# Patient Record
Sex: Female | Born: 1940 | Race: White | Hispanic: No | Marital: Married | Smoking: Never smoker
Health system: Southern US, Community
[De-identification: ages and names within clinical notes are randomized; demographics above are authoritative.]

## PROBLEM LIST (undated history)

## (undated) DIAGNOSIS — F419 Anxiety disorder, unspecified: Secondary | ICD-10-CM

## (undated) DIAGNOSIS — R0602 Shortness of breath: Secondary | ICD-10-CM

## (undated) DIAGNOSIS — G8929 Other chronic pain: Secondary | ICD-10-CM

## (undated) DIAGNOSIS — M797 Fibromyalgia: Secondary | ICD-10-CM

## (undated) DIAGNOSIS — I10 Essential (primary) hypertension: Secondary | ICD-10-CM

## (undated) DIAGNOSIS — R1310 Dysphagia, unspecified: Secondary | ICD-10-CM

## (undated) DIAGNOSIS — J45909 Unspecified asthma, uncomplicated: Secondary | ICD-10-CM

## (undated) DIAGNOSIS — M1990 Unspecified osteoarthritis, unspecified site: Secondary | ICD-10-CM

## (undated) DIAGNOSIS — Z9889 Other specified postprocedural states: Secondary | ICD-10-CM

## (undated) DIAGNOSIS — E7800 Pure hypercholesterolemia, unspecified: Secondary | ICD-10-CM

## (undated) DIAGNOSIS — R112 Nausea with vomiting, unspecified: Secondary | ICD-10-CM

## (undated) DIAGNOSIS — K219 Gastro-esophageal reflux disease without esophagitis: Secondary | ICD-10-CM

## (undated) DIAGNOSIS — E039 Hypothyroidism, unspecified: Secondary | ICD-10-CM

## (undated) DIAGNOSIS — M545 Low back pain, unspecified: Secondary | ICD-10-CM

## (undated) DIAGNOSIS — I2510 Atherosclerotic heart disease of native coronary artery without angina pectoris: Secondary | ICD-10-CM

## (undated) DIAGNOSIS — I499 Cardiac arrhythmia, unspecified: Secondary | ICD-10-CM

## (undated) DIAGNOSIS — IMO0001 Reserved for inherently not codable concepts without codable children: Secondary | ICD-10-CM

## (undated) DIAGNOSIS — C44311 Basal cell carcinoma of skin of nose: Secondary | ICD-10-CM

## (undated) HISTORY — PX: APPENDECTOMY: SHX54

## (undated) HISTORY — PX: BASAL CELL CARCINOMA EXCISION: SHX1214

## (undated) HISTORY — PX: BACK SURGERY: SHX140

## (undated) HISTORY — PX: CORONARY ANGIOPLASTY WITH STENT PLACEMENT: SHX49

## (undated) HISTORY — PX: CARDIAC CATHETERIZATION: SHX172

## (undated) HISTORY — PX: OOPHORECTOMY: SHX86

## (undated) HISTORY — PX: DILATION AND CURETTAGE OF UTERUS: SHX78

## (undated) HISTORY — PX: TUBAL LIGATION: SHX77

## (undated) HISTORY — PX: TONSILLECTOMY: SUR1361

## (undated) HISTORY — PX: POSTERIOR FUSION LUMBAR SPINE: SUR632

## (undated) HISTORY — DX: Fibromyalgia: M79.7

## (undated) HISTORY — PX: CATARACT EXTRACTION W/ INTRAOCULAR LENS  IMPLANT, BILATERAL: SHX1307

## (undated) HISTORY — PX: TOTAL THYROIDECTOMY: SHX2547

---

## 2008-05-31 HISTORY — PX: ANTERIOR CERVICAL DECOMP/DISCECTOMY FUSION: SHX1161

## 2008-08-12 ENCOUNTER — Ambulatory Visit: Payer: Self-pay | Admitting: Vascular Surgery

## 2008-11-18 ENCOUNTER — Encounter: Admission: RE | Admit: 2008-11-18 | Discharge: 2008-11-18 | Payer: Self-pay | Admitting: Neurosurgery

## 2009-01-28 ENCOUNTER — Inpatient Hospital Stay (HOSPITAL_COMMUNITY): Admission: RE | Admit: 2009-01-28 | Discharge: 2009-01-30 | Payer: Self-pay | Admitting: Neurosurgery

## 2010-01-27 ENCOUNTER — Encounter: Admission: RE | Admit: 2010-01-27 | Discharge: 2010-01-27 | Payer: Self-pay | Admitting: Neurosurgery

## 2010-09-05 LAB — COMPREHENSIVE METABOLIC PANEL
AST: 22 U/L (ref 0–37)
Albumin: 4 g/dL (ref 3.5–5.2)
Alkaline Phosphatase: 48 U/L (ref 39–117)
Chloride: 105 mEq/L (ref 96–112)
GFR calc Af Amer: >60 mL/min (ref >60)

## 2010-09-05 LAB — CBC
MCHC: 33.5 g/dL (ref 30.0–36.0)
RBC: 4.6 MIL/uL (ref 3.87–5.11)
RDW: 14.1 % (ref 11.5–15.5)
WBC: 7.8 10*3/uL (ref 4.0–10.5)

## 2010-09-05 LAB — PROTIME-INR: Prothrombin Time: 12.9 seconds (ref 11.6–15.2)

## 2010-10-13 NOTE — Consult Note (Signed)
VASCULAR SURGERY CONSULTATION   KOSISOCHUKWU, GOLDBERG  DOB:  [birth year]                                       08/12/2008  [id number]#:[id number]   This is a New Patient Consultation.   The patient is a 70 year old female who has been having increasing  discomfort, particularly in the right posterior thigh and calf with  standing.  She describes this as an aching burning, throbbing discomfort  over some prominent varicose veins in this area.  She has no swelling or  history of bleeding ulceration, thrombophlebitis or deep venous  thrombosis but has increasing discomfort, as the day progresses, which  is affecting her daily living.  She does not wear elastic compression  stockings.  She does take Aleve and tramadol on a regular basis for her  neck and back as pain medication.  Elevation does not relieve her  symptoms completely.  She has no symptoms on the contralateral left leg.   PAST MEDICAL HISTORY:  1. Cardiac arrhythmias.  2. Negative for:      a.     Diabetes.      b.     Hypertension.      c.     Coronary artery disease.      d.     COPD.      e.     Stroke.   PAST SURGICAL HISTORY:  1. Appendectomy.  2. Tonsillectomy.  3. Thyroidectomy.   FAMILY HISTORY:  Positive for cardiac arrhythmia in her brother,  coronary artery disease and stroke in her father, negative for diabetes.   SOCIAL HISTORY:  She is married, has 2 children and is retired.  Does  not use tobacco or alcohol.   REVIEW OF SYSTEMS:  Pretty much unremarkable with the exception of the  cardiac arrhythmias.  She has been treated with a variety of medications  over the years.  Denies any chest pain, dyspnea on exertion, PND,  orthopnea or claudication symptoms.   ALLERGIES:  Macrodantin and penicillin.   MEDICATIONS:  Please see health history form.   PHYSICAL EXAM:  Blood pressure 113/73, heart rate 69, respirations 14.  General:  She is a female who is in no apparent stress, alert and  oriented x3.  Neck:  Supple, 3+ carotid pulses palpable.  No bruits are  audible.  Neurologic:  Normal.  No palpable adenopathy in the neck.  Chest:  Clear to auscultation.  Cardiovascular:  Reveals a regular  rhythm, no murmurs.  Abdomen:  Soft, nontender with no masses.  She has  3+ femoral, popliteal and posterior tibial pulses bilaterally.  Both  legs have venous insufficiency, right worse than left.  She has diffuse  spider veins in the anterior thighs on the right medially and laterally  and also near the medial ankle area.  Posterior popliteal fossa area has  some large reticular veins and small varicosities surrounding this with  spider veins in the distal thigh area.  There is no hyperpigmentation or  ulceration distally.  The left leg has diffuse spider veins in the  anterior thighs medially and laterally as well as some prominent  varicose veins in the posterior calf.  No distal edema is noted on the  left.   Venous duplex exam in our office today revealed no reflux in the small  saphenous veins bilaterally and no reflux in the  great saphenous veins  on the left.  The right leg has a prominent parallel accessory branch  medially which communicates at the saphenofemoral junction which does  have reflex down to the distal thigh, but the great saphenous vein main  trunk laterally does not have reflux.  Deep systems are unremarkable.   She does have reflux on the right which is the symptomatic leg in this  medial branch.  I think that it could be contributing to the painful  varicosities in the distal thigh and calf.  I visualized this with the  SonoSite and believe that it is too small to treat with laser ablation  but I do think she would be a good candidate for sessions of  sclerotherapy for these painful varicosities which are supplied by this  refluxing vein.  We will treat her conservatively for 3 months with long-  leg elastic compression stockings (20-mm to 30-mm gradient)  as well as  elevation and analgesics.  If she has had no improvement then I think  sessions of sclerotherapy on the right should be performed for these  painful varicosities and she may elect to have sclerotherapy performed  on the left leg for the venous disease as well.   Quita Skye Hart [place], M.D.  Electronically Signed  JDL/MEDQ  D:  08/12/2008  T:  08/13/2008  Job:  [id number]   cc:   Dr. Janee Morn

## 2010-10-13 NOTE — Procedures (Signed)
LOWER EXTREMITY VENOUS REFLUX EXAM   INDICATION:  Bilateral leg varicose veins with pain and swelling.   EXAM:  Using color-flow imaging and pulse Doppler spectral analysis, the  right and left common femoral, superficial femoral, popliteal, posterior  tibial, greater and lesser saphenous veins are evaluated.  There is no  evidence suggesting deep venous insufficiency in the right and left  lower extremity.   The right and left saphenofemoral junction is competent.  The right and  left GSV is competent with the caliber as described below.   The right and left proximal short saphenous vein demonstrates  competency.   GSV Diameter (used if found to be incompetent only)                                            Right    Left  Proximal Greater Saphenous Vein           cm       cm  Proximal-to-mid-thigh                     cm       cm  Mid thigh                                 cm       cm  Mid-distal thigh                          cm       cm  Distal thigh                              cm       cm  Knee                                      cm       cm   IMPRESSION:  1. No evidence of reflux noted in the right and left greater saphenous      veins.  2. The right greater saphenous lateral branch appears to have reflux      with the measurement from 0.43 to 0.50 cm.  3. The right and left greater saphenous veins are not tortuous.  4. The right and left greater saphenous veins are not aneurysmal.  5. The deep system is competent.  6. The right and left lesser saphenous veins are competent.  7. No evidence of deep venous thrombosis noted in the bilateral legs.   ___________________________________________  Quita Skye Hart [place], M.D.   MG/MEDQ  D:  08/12/2008  T:  08/12/2008  Job:  [id number]

## 2010-10-13 NOTE — Op Note (Signed)
Kim Mcgee, Kim Mcgee               ACCOUNT NO.:  [id number]   MEDICAL RECORD NO.:  [id number]          PATIENT TYPE:  INP   LOCATION:  3017                         FACILITY:  [facility]   PHYSICIAN:  Clydene Fake, M.D.  DATE OF BIRTH:  Feb 28, 1941   DATE OF PROCEDURE:  01/28/2009  DATE OF DISCHARGE:                               OPERATIVE REPORT   PREOPERATIVE DIAGNOSES:  Cervical spondylosis, stenosis, myelopathy, and  radiculopathy at C4-5, 5-6, and 6-7.   POSTOPERATIVE DIAGNOSES:  Cervical spondylosis, stenosis, myelopathy,  and radiculopathy at C4-5, 5-6, and 6-7.   PROCEDURES:  Anterior cervical decompression, diskectomy, and fusion at  C4-5, 5-6, and 6-7 with LifeNet allograft bone, Trestle anterior  cervical plate.   SURGEON:  Clydene Fake, MD   ASSISTANT:  Coletta Memos, MD   ANESTHESIA:  General endotracheal tube anesthesia.   ESTIMATED BLOOD LOSS:  Minimal.   BLOOD GIVEN:  None.   DRAINS:  None.   COMPLICATIONS:  None.   REASON FOR PROCEDURE:  The patient is a 70 year old woman, who has been  having neck and the right shoulder arm pain with some progressive  clumsiness, who has had a couple of MRIs over a period of time showing  worsening spondylytic change of the cervical spine with cervical  stenosis and some cord compression at 5-6 and severe spondylytic  changes, the right femoral narrowing in 4-5 and left femoral narrowing  in 6-7.  The patient was brought in for decompression and fusion at  these levels.   PROCEDURE IN DETAIL:  The patient was brought to the operating room and  general anesthesia was induced.  The patient was placed in 10-pound  Halter traction, prepped and draped in standard sterile fashion.  Site  of incision was injected with 10 mL of 1% lidocaine with epinephrine.  Incision was then made from the midline to the anterior border of the  sternocleidomastoid muscle on the left side.  The neck incision was  taken down to the platysma and  hemostasis was obtained with Bovie  cauterization.  The platysma muscle was incised with a Bovie and blunt  dissection taken through the anterior cervical fascia to the anterior  cervical spine.  Needle was placed in interspace.  X-rays were obtained  showing this was at the 4-5 space.  Disk space was incised with a 15  blade and partial diskectomy was performed as ligament was removed and  exposed the 5-6 and 6-7 disk spaces also, and the longus colli muscle  was reflected laterally at C4-C7 bilaterally using the Bovie, and self-  retaining retractors were placed.  We could see the 4-5 and 5-6 disk  spaces.  Disk spaces were again incised with 15 blade.  Diskectomy  continued with pituitary rongeurs.  Anterior osteophytes were removed  with Leksell rongeurs and Kerrison punches.  Distraction pins were  placed in the C4 and C6 interspaces and distracted.  Microscope was  brought in for microdissection.  At this point starting at the 5-6 level  __________ diskectomy continued to__________ spondylitic.  A high speed  drill was used to  help drill __________ disk space.  We then were able  to use curettes and then 1- and 2-mm Kerrison punches to remove disk,  posterior ligament, and posterior osteophytes.  As we decompressed the  central canal and then did bilateral foraminotomies to make sure the  foramen were opened.  We measured height of disk space to be 4 mm.  We  got hemostasis with Gelfoam and thrombin.  At this point, attention went  to 4-5 for a diskectomy, continued with pituitary rongeurs and curettes  and 1- and 2-mm Kerrison punches to remove the posterior disk, ligament,  and osteophytes decompressing the central canal and we performed  bilateral foraminotomies with the Kittner.  We used a high-speed drill  to remove cartilaginous endplates.  We then measured height of disk  space to be 5 mm and we got hemostasis with Gelfoam and thrombin.  We  then tapped the corresponding bone,  selected an allograft which was  placed in position,  a 4 mm at 5-6 and a 5 mm at 4-5, countersinking of  couple of millimeters and checking posterior to the graft, making sure  there was plenty of room between bone graft and dura and all that looked  good.  We irrigated with antibiotic solution, removed the distraction  pin from C4, removed a retractor down from C6-7 level, inside of the  disk space with partial diskectomy and pituitary rongeursto remove  anterior osteophytes, placed distraction pins at C7, and distracted the  interspace at the 6-7.  Diskectomy continued with curettes and then 1-  and 2-mm Kerrison punches to remove posterior ligaments and disk  osteophytes and did bilateral foraminotomies to make sure the nerve  roots were decompressed.  I used a high-speed drill to remove  cartilaginous endplate, measured the height of disk space to be 5 mm,  and then tapped a 5-mm LifeNet allograft bone into place.  Distraction  pins removed.  Hemostasis was obtained with Gelfoam and thrombin.  Weight was removed from the traction.  Bone plugs were in good position  and firmly in place.  A Trestle anterior cervical plate was placed over  the anterior cervical spine and 2 screws were placed in C4, 2 in the C5,  2 in the C6, and 2 in the C7, these were tightened down.  Lateral x-rays  obtained showing good position of plates, screws, bone plugs, __________  4-5, 5-6, and 6-7.  We irrigated with antibiotic solution with very good  hemostasis, and the platysma was closed with 3-0 Vicryl interrupted  sutures.  Subcutaneous tissue was also closed with 3-0 Vicryl  interrupted sutures.  The skin was closed with benzoin and Steri-Strips.  Dressing was placed.  The patient was placed in a soft cervical collar,  awoken from anesthesia, and transferred to recovery room in stable  condition.           ______________________________  Clydene Fake, M.D.     JRH/MEDQ  D:  01/28/2009  T:   01/29/2009  Job:  [id number]

## 2011-10-07 ENCOUNTER — Other Ambulatory Visit: Payer: Self-pay | Admitting: Neurosurgery

## 2011-10-07 DIAGNOSIS — M546 Pain in thoracic spine: Secondary | ICD-10-CM

## 2011-10-07 DIAGNOSIS — IMO0002 Reserved for concepts with insufficient information to code with codable children: Secondary | ICD-10-CM

## 2011-10-07 DIAGNOSIS — M545 Low back pain: Secondary | ICD-10-CM

## 2011-10-20 ENCOUNTER — Other Ambulatory Visit: Payer: Self-pay

## 2011-12-01 ENCOUNTER — Ambulatory Visit
Admission: RE | Admit: 2011-12-01 | Discharge: 2011-12-01 | Disposition: A | Discharge status: Home / Self Care | Payer: Medicare Other | Source: Ambulatory Visit | Attending: Neurosurgery | Admitting: Neurosurgery

## 2011-12-01 DIAGNOSIS — IMO0002 Reserved for concepts with insufficient information to code with codable children: Secondary | ICD-10-CM

## 2011-12-01 DIAGNOSIS — M545 Low back pain: Secondary | ICD-10-CM

## 2011-12-01 DIAGNOSIS — M546 Pain in thoracic spine: Secondary | ICD-10-CM

## 2011-12-06 ENCOUNTER — Other Ambulatory Visit: Payer: Self-pay

## 2011-12-16 ENCOUNTER — Other Ambulatory Visit: Payer: Self-pay | Admitting: Neurosurgery

## 2012-01-21 ENCOUNTER — Encounter (HOSPITAL_COMMUNITY): Payer: Self-pay | Admitting: Pharmacy Technician

## 2012-02-02 ENCOUNTER — Ambulatory Visit (HOSPITAL_COMMUNITY)
Admission: RE | Admit: 2012-02-02 | Discharge: 2012-02-02 | Disposition: A | Discharge status: Home / Self Care | Payer: Medicare Other | Source: Ambulatory Visit | Attending: Neurosurgery | Admitting: Neurosurgery

## 2012-02-02 ENCOUNTER — Encounter (HOSPITAL_COMMUNITY)
Admission: RE | Admit: 2012-02-02 | Discharge: 2012-02-02 | Disposition: A | Discharge status: Home / Self Care | Payer: Medicare Other | Source: Ambulatory Visit | Attending: Neurosurgery | Admitting: Neurosurgery

## 2012-02-02 ENCOUNTER — Encounter (HOSPITAL_COMMUNITY): Payer: Self-pay

## 2012-02-02 DIAGNOSIS — J4489 Other specified chronic obstructive pulmonary disease: Secondary | ICD-10-CM | POA: Insufficient documentation

## 2012-02-02 DIAGNOSIS — Z01812 Encounter for preprocedural laboratory examination: Secondary | ICD-10-CM | POA: Insufficient documentation

## 2012-02-02 DIAGNOSIS — Z01818 Encounter for other preprocedural examination: Secondary | ICD-10-CM | POA: Insufficient documentation

## 2012-02-02 DIAGNOSIS — J449 Chronic obstructive pulmonary disease, unspecified: Secondary | ICD-10-CM | POA: Insufficient documentation

## 2012-02-02 HISTORY — DX: Anxiety disorder, unspecified: F41.9

## 2012-02-02 HISTORY — DX: Shortness of breath: R06.02

## 2012-02-02 HISTORY — DX: Unspecified asthma, uncomplicated: J45.909

## 2012-02-02 HISTORY — DX: Unspecified osteoarthritis, unspecified site: M19.90

## 2012-02-02 HISTORY — DX: Dysphagia, unspecified: R13.10

## 2012-02-02 HISTORY — DX: Cardiac arrhythmia, unspecified: I49.9

## 2012-02-02 HISTORY — DX: Hypothyroidism, unspecified: E03.9

## 2012-02-02 HISTORY — DX: Gastro-esophageal reflux disease without esophagitis: K21.9

## 2012-02-02 HISTORY — DX: Essential (primary) hypertension: I10

## 2012-02-02 HISTORY — DX: Reserved for inherently not codable concepts without codable children: IMO0001

## 2012-02-02 LAB — CBC WITH DIFFERENTIAL/PLATELET
Basophils Absolute: 0 10*3/uL (ref 0.0–0.1)
Basophils Relative: 0 % (ref 0–1)
HCT: 42.6 % (ref 36.0–46.0)
Lymphocytes Relative: 37 % (ref 12–46)
MCHC: 32.6 g/dL (ref 30.0–36.0)
Neutro Abs: 4.5 10*3/uL (ref 1.7–7.7)
Neutrophils Relative %: 51 % (ref 43–77)
Platelets: 239 10*3/uL (ref 150–400)
RDW: 14.1 % (ref 11.5–15.5)
WBC: 8.8 10*3/uL (ref 4.0–10.5)

## 2012-02-02 LAB — TYPE AND SCREEN: Antibody Screen: NEGATIVE

## 2012-02-02 LAB — SURGICAL PCR SCREEN
MRSA, PCR: POSITIVE — AB
Staphylococcus aureus: POSITIVE — AB

## 2012-02-02 LAB — BASIC METABOLIC PANEL
BUN: 8 mg/dL (ref 6–23)
Chloride: 104 mEq/L (ref 96–112)
Creatinine, Ser: 0.59 mg/dL (ref 0.50–1.10)
GFR calc Af Amer: >90 mL/min (ref >90)
GFR calc non Af Amer: >90 mL/min (ref >90)

## 2012-02-02 LAB — APTT: aPTT: 30 seconds (ref 24–37)

## 2012-02-02 LAB — PROTIME-INR: Prothrombin Time: 13.2 seconds (ref 11.6–15.2)

## 2012-02-02 NOTE — Progress Notes (Addendum)
Call to Dr. Arlana Pouch in [place], spoke with Bonita Quin, requested last OV note, ekg, stress/echo.

## 2012-02-02 NOTE — Progress Notes (Signed)
Left msg. On pt. Home phone to call [facility] if she has any ?'s, but to add Verapamil to her list of meds. to take DOS.

## 2012-02-02 NOTE — Pre-Procedure Instructions (Addendum)
20 Kim Mcgee   02/02/2012   Your procedure is scheduled on: Monday, September 9th  Report to [facility] at 5:30 AM.  Call this number if you have problems the morning of surgery: [phone]   Remember:   Do not eat food or drink any liquids:After Midnight Sunday.    Take these medicines the morning of surgery with A SIP OF WATER: Inhalers,              Metoprolol, Synthroid, Ultram, digoxin   Ladies--Do not wear jewelry, make-up or nail polish.  Do not wear lotions, powders, or perfumes. You may NOT wear deodorant.  Do not shave 48 hours prior to surgery.   Do not bring valuables to the hospital.  Contacts, dentures or bridgework may not be worn into surgery.   Leave suitcase in the car. After surgery it may be brought to your room.  For patients admitted to the hospital, checkout time is 11:00 AM the day of discharge.   Patients discharged the day of surgery will not be allowed to drive home.  Name and phone number of your driver:     Special Instructions: CHG Shower Use Special Wash: 1/2 bottle night before surgery and 1/2 bottle morning of surgery.   Please read over the following fact sheets that you were given: Pain Booklet, Coughing and Deep Breathing, Blood Transfusion Information, MRSA Information and Surgical Site Infection Prevention

## 2012-02-06 MED ORDER — CEFAZOLIN SODIUM-DEXTROSE 2-3 GM-% IV SOLR
2.0000 g | INTRAVENOUS | Status: AC
  Administered 2012-02-07: 2 g via INTRAVENOUS
  Filled 2012-02-06 (×2): qty 50

## 2012-02-07 ENCOUNTER — Encounter (HOSPITAL_COMMUNITY): Payer: Self-pay | Admitting: Anesthesiology

## 2012-02-07 ENCOUNTER — Encounter (HOSPITAL_COMMUNITY): Payer: Self-pay | Admitting: Surgery

## 2012-02-07 ENCOUNTER — Inpatient Hospital Stay (HOSPITAL_COMMUNITY): Payer: Medicare Other

## 2012-02-07 ENCOUNTER — Inpatient Hospital Stay (HOSPITAL_COMMUNITY): Payer: Medicare Other | Admitting: Anesthesiology

## 2012-02-07 ENCOUNTER — Encounter (HOSPITAL_COMMUNITY)
Admission: RE | Disposition: A | Discharge status: Skilled Nursing Facility | Payer: Self-pay | Source: Ambulatory Visit | Attending: Neurosurgery

## 2012-02-07 ENCOUNTER — Inpatient Hospital Stay (HOSPITAL_COMMUNITY)
Admission: RE | Admit: 2012-02-07 | Discharge: 2012-02-25 | DRG: 460 | Disposition: A | Discharge status: Skilled Nursing Facility | Payer: Medicare Other | Source: Ambulatory Visit | Attending: Neurosurgery | Admitting: Neurosurgery

## 2012-02-07 DIAGNOSIS — N390 Urinary tract infection, site not specified: Secondary | ICD-10-CM | POA: Diagnosis not present

## 2012-02-07 DIAGNOSIS — R0902 Hypoxemia: Secondary | ICD-10-CM

## 2012-02-07 DIAGNOSIS — M48061 Spinal stenosis, lumbar region without neurogenic claudication: Principal | ICD-10-CM | POA: Diagnosis present

## 2012-02-07 DIAGNOSIS — I472 Ventricular tachycardia, unspecified: Secondary | ICD-10-CM | POA: Diagnosis present

## 2012-02-07 DIAGNOSIS — K5900 Constipation, unspecified: Secondary | ICD-10-CM | POA: Diagnosis not present

## 2012-02-07 DIAGNOSIS — R339 Retention of urine, unspecified: Secondary | ICD-10-CM | POA: Diagnosis not present

## 2012-02-07 DIAGNOSIS — R609 Edema, unspecified: Secondary | ICD-10-CM | POA: Diagnosis not present

## 2012-02-07 DIAGNOSIS — K219 Gastro-esophageal reflux disease without esophagitis: Secondary | ICD-10-CM

## 2012-02-07 DIAGNOSIS — J9819 Other pulmonary collapse: Secondary | ICD-10-CM | POA: Diagnosis not present

## 2012-02-07 DIAGNOSIS — E039 Hypothyroidism, unspecified: Secondary | ICD-10-CM | POA: Diagnosis present

## 2012-02-07 DIAGNOSIS — Z9889 Other specified postprocedural states: Secondary | ICD-10-CM

## 2012-02-07 DIAGNOSIS — J383 Other diseases of vocal cords: Secondary | ICD-10-CM

## 2012-02-07 DIAGNOSIS — R0789 Other chest pain: Secondary | ICD-10-CM | POA: Diagnosis not present

## 2012-02-07 DIAGNOSIS — R079 Chest pain, unspecified: Secondary | ICD-10-CM

## 2012-02-07 DIAGNOSIS — I10 Essential (primary) hypertension: Secondary | ICD-10-CM | POA: Diagnosis present

## 2012-02-07 DIAGNOSIS — I441 Atrioventricular block, second degree: Secondary | ICD-10-CM | POA: Diagnosis present

## 2012-02-07 DIAGNOSIS — R0602 Shortness of breath: Secondary | ICD-10-CM

## 2012-02-07 DIAGNOSIS — J45909 Unspecified asthma, uncomplicated: Secondary | ICD-10-CM | POA: Diagnosis present

## 2012-02-07 DIAGNOSIS — M4800 Spinal stenosis, site unspecified: Secondary | ICD-10-CM | POA: Diagnosis present

## 2012-02-07 DIAGNOSIS — I4729 Other ventricular tachycardia: Secondary | ICD-10-CM | POA: Diagnosis present

## 2012-02-07 SURGERY — POSTERIOR LUMBAR FUSION 3 LEVEL
Anesthesia: General | Site: Back | Wound class: Clean

## 2012-02-07 MED ORDER — KCL IN DEXTROSE-NACL 20-5-0.45 MEQ/L-%-% IV SOLN
INTRAVENOUS | Status: AC
  Filled 2012-02-07: qty 1000

## 2012-02-07 MED ORDER — HYDROMORPHONE HCL PF 1 MG/ML IJ SOLN
0.2500 mg | INTRAMUSCULAR | Status: DC | PRN
  Administered 2012-02-07 (×4): 0.5 mg via INTRAVENOUS

## 2012-02-07 MED ORDER — MORPHINE SULFATE (PF) 1 MG/ML IV SOLN
INTRAVENOUS | Status: AC
  Administered 2012-02-08: 13 mg
  Filled 2012-02-07: qty 25

## 2012-02-07 MED ORDER — LORAZEPAM 1 MG PO TABS
1.0000 mg | ORAL_TABLET | Freq: Every evening | ORAL | Status: DC | PRN
  Administered 2012-02-11 – 2012-02-22 (×8): 1 mg via ORAL
  Filled 2012-02-07 (×9): qty 1

## 2012-02-07 MED ORDER — ONDANSETRON HCL 4 MG/2ML IJ SOLN
INTRAMUSCULAR | Status: DC | PRN
  Administered 2012-02-07: 4 mg via INTRAVENOUS

## 2012-02-07 MED ORDER — METOPROLOL SUCCINATE 12.5 MG HALF TABLET
12.5000 mg | ORAL_TABLET | Freq: Every day | ORAL | Status: DC
  Administered 2012-02-08 – 2012-02-25 (×18): 12.5 mg via ORAL
  Filled 2012-02-07 (×20): qty 1

## 2012-02-07 MED ORDER — SODIUM CHLORIDE 0.9 % IJ SOLN
3.0000 mL | Freq: Two times a day (BID) | INTRAMUSCULAR | Status: DC
  Administered 2012-02-09 (×2): 3 mL via INTRAVENOUS

## 2012-02-07 MED ORDER — SODIUM CHLORIDE 0.9 % IJ SOLN: 3.0000 mL | INTRAMUSCULAR | Status: DC | PRN

## 2012-02-07 MED ORDER — FLUTICASONE-SALMETEROL 500-50 MCG/DOSE IN AEPB
1.0000 | INHALATION_SPRAY | Freq: Two times a day (BID) | RESPIRATORY_TRACT | Status: DC
  Administered 2012-02-07 – 2012-02-10 (×6): 1 via RESPIRATORY_TRACT
  Filled 2012-02-07: qty 14

## 2012-02-07 MED ORDER — VANCOMYCIN HCL IN DEXTROSE 1-5 GM/200ML-% IV SOLN
1000.0000 mg | Freq: Two times a day (BID) | INTRAVENOUS | Status: AC
  Administered 2012-02-08 – 2012-02-09 (×3): 1000 mg via INTRAVENOUS
  Filled 2012-02-07 (×4): qty 200

## 2012-02-07 MED ORDER — NALOXONE HCL 0.4 MG/ML IJ SOLN: 0.4000 mg | INTRAMUSCULAR | Status: DC | PRN

## 2012-02-07 MED ORDER — DIPHENHYDRAMINE HCL 12.5 MG/5ML PO ELIX
12.5000 mg | ORAL_SOLUTION | Freq: Four times a day (QID) | ORAL | Status: DC | PRN
  Filled 2012-02-07: qty 5

## 2012-02-07 MED ORDER — CYCLOBENZAPRINE HCL 10 MG PO TABS
10.0000 mg | ORAL_TABLET | Freq: Three times a day (TID) | ORAL | Status: DC | PRN
  Administered 2012-02-07 – 2012-02-16 (×10): 10 mg via ORAL
  Filled 2012-02-07 (×10): qty 1

## 2012-02-07 MED ORDER — PROMETHAZINE HCL 25 MG PO TABS: 12.5000 mg | ORAL_TABLET | ORAL | Status: DC | PRN

## 2012-02-07 MED ORDER — PANTOPRAZOLE SODIUM 40 MG PO TBEC
40.0000 mg | DELAYED_RELEASE_TABLET | Freq: Every day | ORAL | Status: DC
  Administered 2012-02-08 – 2012-02-09 (×2): 40 mg via ORAL
  Filled 2012-02-07 (×2): qty 1

## 2012-02-07 MED ORDER — FLEET ENEMA 7-19 GM/118ML RE ENEM: 1.0000 | ENEMA | Freq: Once | RECTAL | Status: AC | PRN

## 2012-02-07 MED ORDER — FENTANYL CITRATE 0.05 MG/ML IJ SOLN
INTRAMUSCULAR | Status: DC | PRN
  Administered 2012-02-07: 100 ug via INTRAVENOUS
  Administered 2012-02-07 (×2): 50 ug via INTRAVENOUS
  Administered 2012-02-07: 100 ug via INTRAVENOUS

## 2012-02-07 MED ORDER — METHOCARBAMOL 500 MG PO TABS
500.0000 mg | ORAL_TABLET | Freq: Four times a day (QID) | ORAL | Status: DC | PRN
  Administered 2012-02-08 – 2012-02-25 (×33): 500 mg via ORAL
  Filled 2012-02-07 (×33): qty 1

## 2012-02-07 MED ORDER — DIGOXIN 125 MCG PO TABS
0.1250 mg | ORAL_TABLET | Freq: Every day | ORAL | Status: DC
  Administered 2012-02-08 – 2012-02-25 (×18): 0.125 mg via ORAL
  Filled 2012-02-07 (×21): qty 1

## 2012-02-07 MED ORDER — BACITRACIN 50000 UNITS IM SOLR
INTRAMUSCULAR | Status: AC
  Filled 2012-02-07: qty 1

## 2012-02-07 MED ORDER — METHOCARBAMOL 100 MG/ML IJ SOLN
500.0000 mg | Freq: Four times a day (QID) | INTRAVENOUS | Status: DC | PRN
  Filled 2012-02-07: qty 5

## 2012-02-07 MED ORDER — SIMVASTATIN 10 MG PO TABS
10.0000 mg | ORAL_TABLET | Freq: Every day | ORAL | Status: DC
  Administered 2012-02-07 – 2012-02-24 (×17): 10 mg via ORAL
  Filled 2012-02-07 (×20): qty 1

## 2012-02-07 MED ORDER — THROMBIN 20000 UNITS EX KIT
PACK | CUTANEOUS | Status: DC | PRN
  Administered 2012-02-07: 20000 [IU] via TOPICAL

## 2012-02-07 MED ORDER — BISACODYL 10 MG RE SUPP
10.0000 mg | Freq: Every day | RECTAL | Status: DC | PRN
  Administered 2012-02-10 – 2012-02-24 (×9): 10 mg via RECTAL
  Filled 2012-02-07 (×9): qty 1

## 2012-02-07 MED ORDER — 0.9 % SODIUM CHLORIDE (POUR BTL) OPTIME
TOPICAL | Status: DC | PRN
  Administered 2012-02-07: 1000 mL

## 2012-02-07 MED ORDER — DOCUSATE SODIUM 100 MG PO CAPS
100.0000 mg | ORAL_CAPSULE | Freq: Two times a day (BID) | ORAL | Status: DC
  Administered 2012-02-07 – 2012-02-25 (×36): 100 mg via ORAL
  Filled 2012-02-07 (×34): qty 1

## 2012-02-07 MED ORDER — MORPHINE SULFATE (PF) 1 MG/ML IV SOLN
INTRAVENOUS | Status: DC
  Administered 2012-02-07: 17:00:00 via INTRAVENOUS
  Administered 2012-02-08: 13 mg via INTRAVENOUS
  Administered 2012-02-08: 5 mg via INTRAVENOUS
  Administered 2012-02-08: 1 mg via INTRAVENOUS
  Administered 2012-02-08: 6 mg via INTRAVENOUS
  Administered 2012-02-08: 3 mg via INTRAVENOUS
  Administered 2012-02-08: 4 mg via INTRAVENOUS
  Administered 2012-02-09: 6 mg via INTRAVENOUS
  Administered 2012-02-09: 7 mg via INTRAVENOUS
  Administered 2012-02-09 – 2012-02-10 (×3): 4 mg via INTRAVENOUS
  Administered 2012-02-10: 10 mg via INTRAVENOUS
  Administered 2012-02-10: 5 mg via INTRAVENOUS
  Administered 2012-02-10: 25 mg via INTRAVENOUS
  Administered 2012-02-10: 3 mg via INTRAVENOUS
  Administered 2012-02-10: 7 mg via INTRAVENOUS
  Administered 2012-02-10: 9 mg via INTRAVENOUS
  Filled 2012-02-07 (×4): qty 25

## 2012-02-07 MED ORDER — MIDAZOLAM HCL 5 MG/5ML IJ SOLN
INTRAMUSCULAR | Status: DC | PRN
  Administered 2012-02-07: 2 mg via INTRAVENOUS

## 2012-02-07 MED ORDER — OXYCODONE HCL 5 MG/5ML PO SOLN: 5.0000 mg | Freq: Once | ORAL | Status: DC | PRN

## 2012-02-07 MED ORDER — LEVOTHYROXINE SODIUM 125 MCG PO TABS
125.0000 ug | ORAL_TABLET | Freq: Every day | ORAL | Status: DC
  Administered 2012-02-08 – 2012-02-25 (×18): 125 ug via ORAL
  Filled 2012-02-07 (×20): qty 1

## 2012-02-07 MED ORDER — HEMOSTATIC AGENTS (NO CHARGE) OPTIME
TOPICAL | Status: DC | PRN
  Administered 2012-02-07: 1 via TOPICAL

## 2012-02-07 MED ORDER — LACTATED RINGERS IV SOLN
INTRAVENOUS | Status: DC | PRN
  Administered 2012-02-07 (×2): via INTRAVENOUS

## 2012-02-07 MED ORDER — PROPOFOL 10 MG/ML IV EMUL
INTRAVENOUS | Status: DC | PRN
  Administered 2012-02-07: 120 mg via INTRAVENOUS

## 2012-02-07 MED ORDER — VANCOMYCIN HCL IN DEXTROSE 1-5 GM/200ML-% IV SOLN
INTRAVENOUS | Status: AC
  Administered 2012-02-07: 1 g
  Filled 2012-02-07: qty 200

## 2012-02-07 MED ORDER — PROMETHAZINE HCL 25 MG/ML IJ SOLN
12.5000 mg | INTRAMUSCULAR | Status: DC | PRN
  Filled 2012-02-07: qty 1

## 2012-02-07 MED ORDER — ACETAMINOPHEN 325 MG PO TABS
650.0000 mg | ORAL_TABLET | ORAL | Status: DC | PRN
  Administered 2012-02-08 – 2012-02-09 (×3): 650 mg via ORAL
  Filled 2012-02-07 (×3): qty 2

## 2012-02-07 MED ORDER — ALBUTEROL SULFATE HFA 108 (90 BASE) MCG/ACT IN AERS
2.0000 | INHALATION_SPRAY | Freq: Four times a day (QID) | RESPIRATORY_TRACT | Status: DC | PRN
  Administered 2012-02-09: 2 via RESPIRATORY_TRACT
  Filled 2012-02-07: qty 6.7

## 2012-02-07 MED ORDER — VERAPAMIL HCL ER 240 MG PO TBCR
240.0000 mg | EXTENDED_RELEASE_TABLET | Freq: Every day | ORAL | Status: DC
  Administered 2012-02-08 – 2012-02-25 (×18): 240 mg via ORAL
  Filled 2012-02-07 (×21): qty 1

## 2012-02-07 MED ORDER — ZOLPIDEM TARTRATE 5 MG PO TABS
5.0000 mg | ORAL_TABLET | Freq: Every evening | ORAL | Status: DC | PRN
  Administered 2012-02-16 – 2012-02-20 (×2): 5 mg via ORAL
  Filled 2012-02-07 (×2): qty 1

## 2012-02-07 MED ORDER — LIDOCAINE HCL (CARDIAC) 20 MG/ML IV SOLN
INTRAVENOUS | Status: DC | PRN
  Administered 2012-02-07: 50 mg via INTRAVENOUS

## 2012-02-07 MED ORDER — DIPHENHYDRAMINE HCL 50 MG/ML IJ SOLN
12.5000 mg | Freq: Four times a day (QID) | INTRAMUSCULAR | Status: DC | PRN
  Administered 2012-02-09: 12.5 mg via INTRAVENOUS
  Filled 2012-02-07: qty 1

## 2012-02-07 MED ORDER — LIDOCAINE-EPINEPHRINE 1 %-1:100000 IJ SOLN
INTRAMUSCULAR | Status: DC | PRN
  Administered 2012-02-07: 20 mL

## 2012-02-07 MED ORDER — SODIUM CHLORIDE 0.9 % IR SOLN
Status: DC | PRN
  Administered 2012-02-07: 10:00:00

## 2012-02-07 MED ORDER — ONDANSETRON HCL 4 MG/2ML IJ SOLN
4.0000 mg | Freq: Four times a day (QID) | INTRAMUSCULAR | Status: DC | PRN
  Administered 2012-02-07 – 2012-02-08 (×2): 4 mg via INTRAVENOUS
  Filled 2012-02-07 (×5): qty 2

## 2012-02-07 MED ORDER — HYDROMORPHONE HCL PF 1 MG/ML IJ SOLN
INTRAMUSCULAR | Status: AC
  Filled 2012-02-07: qty 1

## 2012-02-07 MED ORDER — KCL IN DEXTROSE-NACL 20-5-0.45 MEQ/L-%-% IV SOLN
INTRAVENOUS | Status: DC
  Administered 2012-02-07 – 2012-02-13 (×8): via INTRAVENOUS
  Filled 2012-02-07 (×19): qty 1000

## 2012-02-07 MED ORDER — SODIUM CHLORIDE 0.9 % IJ SOLN: 9.0000 mL | INTRAMUSCULAR | Status: DC | PRN

## 2012-02-07 MED ORDER — ACETAMINOPHEN 650 MG RE SUPP: 650.0000 mg | RECTAL | Status: DC | PRN

## 2012-02-07 MED ORDER — OXYCODONE HCL 5 MG PO TABS: 5.0000 mg | ORAL_TABLET | Freq: Once | ORAL | Status: DC | PRN

## 2012-02-07 MED ORDER — KETOROLAC TROMETHAMINE 30 MG/ML IJ SOLN
30.0000 mg | Freq: Four times a day (QID) | INTRAMUSCULAR | Status: AC
  Administered 2012-02-07 – 2012-02-08 (×4): 30 mg via INTRAVENOUS
  Filled 2012-02-07 (×5): qty 1

## 2012-02-07 MED ORDER — MAGNESIUM HYDROXIDE 400 MG/5ML PO SUSP: 30.0000 mL | Freq: Every day | ORAL | Status: DC | PRN

## 2012-02-07 MED ORDER — SODIUM CHLORIDE 0.9 % IV SOLN
INTRAVENOUS | Status: AC
  Filled 2012-02-07: qty 500

## 2012-02-07 MED ORDER — ONDANSETRON HCL 4 MG/2ML IJ SOLN
4.0000 mg | INTRAMUSCULAR | Status: DC | PRN
  Administered 2012-02-08 – 2012-02-22 (×7): 4 mg via INTRAVENOUS
  Filled 2012-02-07 (×4): qty 2

## 2012-02-07 MED ORDER — ROCURONIUM BROMIDE 100 MG/10ML IV SOLN
INTRAVENOUS | Status: DC | PRN
  Administered 2012-02-07: 10 mg via INTRAVENOUS
  Administered 2012-02-07: 50 mg via INTRAVENOUS
  Administered 2012-02-07: 20 mg via INTRAVENOUS

## 2012-02-07 MED ORDER — SODIUM CHLORIDE 0.9 % IV SOLN: 250.0000 mL | INTRAVENOUS | Status: DC

## 2012-02-07 SURGICAL SUPPLY — 70 items
BAG DECANTER FOR FLEXI CONT (MISCELLANEOUS) ×2 IMPLANT
BENZOIN TINCTURE PRP APPL 2/3 (GAUZE/BANDAGES/DRESSINGS) ×2 IMPLANT
BLADE SURG ROTATE 9660 (MISCELLANEOUS) IMPLANT
BONE VOID FILLER STRIP 10CC (Bone Implant) ×2 IMPLANT
BUR PRECISION FLUTE 5.0 (BURR) ×2 IMPLANT
BUR ROUND FLUTED 5 RND (BURR) ×2 IMPLANT
CAGE CONCORDE BULLET 9X11X23 (Cage) ×1 IMPLANT
CAGE CONCORDE BULLET 9X9X23 (Cage) ×2 IMPLANT
CAGE SPNL 5D BLT NOSE 23X9X11 (Cage) ×1 IMPLANT
CAGE SPNL PRLL BLT NOSE 23X9X9 (Cage) ×2 IMPLANT
CANISTER SUCTION 2500CC (MISCELLANEOUS) ×2 IMPLANT
CLOTH BEACON ORANGE TIMEOUT ST (SAFETY) ×2 IMPLANT
CONNECTOR EXPEDIUM SFX SZ A5 (Orthopedic Implant) ×2 IMPLANT
CONT SPEC 4OZ CLIKSEAL STRL BL (MISCELLANEOUS) ×4 IMPLANT
COVER BACK TABLE 24X17X13 BIG (DRAPES) IMPLANT
COVER TABLE BACK 60X90 (DRAPES) ×2 IMPLANT
DRAPE C-ARM 42X72 X-RAY (DRAPES) ×4 IMPLANT
DRAPE LAPAROTOMY 100X72X124 (DRAPES) ×2 IMPLANT
DRAPE POUCH INSTRU U-SHP 10X18 (DRAPES) ×2 IMPLANT
DRAPE PROXIMA HALF (DRAPES) IMPLANT
DRAPE SURG 17X23 STRL (DRAPES) ×2 IMPLANT
DRESSING TELFA 8X3 (GAUZE/BANDAGES/DRESSINGS) IMPLANT
DURAPREP 26ML APPLICATOR (WOUND CARE) ×2 IMPLANT
ELECT REM PT RETURN 9FT ADLT (ELECTROSURGICAL) ×2
ELECTRODE REM PT RTRN 9FT ADLT (ELECTROSURGICAL) ×1 IMPLANT
GAUZE SPONGE 4X4 16PLY XRAY LF (GAUZE/BANDAGES/DRESSINGS) IMPLANT
GLOVE BIOGEL PI IND STRL 8 (GLOVE) ×1 IMPLANT
GLOVE BIOGEL PI IND STRL 8.5 (GLOVE) ×1 IMPLANT
GLOVE BIOGEL PI INDICATOR 8 (GLOVE) ×1
GLOVE BIOGEL PI INDICATOR 8.5 (GLOVE) ×1
GLOVE ECLIPSE 7.5 STRL STRAW (GLOVE) ×6 IMPLANT
GLOVE ECLIPSE 8.5 STRL (GLOVE) ×2 IMPLANT
GLOVE EXAM NITRILE LRG STRL (GLOVE) IMPLANT
GLOVE EXAM NITRILE MD LF STRL (GLOVE) IMPLANT
GLOVE EXAM NITRILE XL STR (GLOVE) IMPLANT
GLOVE EXAM NITRILE XS STR PU (GLOVE) IMPLANT
GLOVE SURG SS PI 8.0 STRL IVOR (GLOVE) ×2 IMPLANT
GOWN BRE IMP SLV AUR LG STRL (GOWN DISPOSABLE) ×2 IMPLANT
GOWN BRE IMP SLV AUR XL STRL (GOWN DISPOSABLE) ×2 IMPLANT
GOWN STRL REIN 2XL LVL4 (GOWN DISPOSABLE) ×2 IMPLANT
KIT BASIN OR (CUSTOM PROCEDURE TRAY) ×2 IMPLANT
KIT ROOM TURNOVER OR (KITS) ×2 IMPLANT
MILL MEDIUM DISP (BLADE) ×2 IMPLANT
NEEDLE HYPO 22GX1.5 SAFETY (NEEDLE) ×2 IMPLANT
NS IRRIG 1000ML POUR BTL (IV SOLUTION) ×2 IMPLANT
PACK LAMINECTOMY NEURO (CUSTOM PROCEDURE TRAY) ×2 IMPLANT
PAD ARMBOARD 7.5X6 YLW CONV (MISCELLANEOUS) ×6 IMPLANT
PATTIES SURGICAL .5 X.5 (GAUZE/BANDAGES/DRESSINGS) IMPLANT
PATTIES SURGICAL .5 X1 (DISPOSABLE) IMPLANT
PATTIES SURGICAL .75X.75 (GAUZE/BANDAGES/DRESSINGS) ×2 IMPLANT
PATTIES SURGICAL 1X1 (DISPOSABLE) IMPLANT
ROD PREBENT EXPEDIUM 5.5 95MM (Rod) ×4 IMPLANT
SCREW EXPEDIUM POLYAXIAL 6X50M (Screw) ×16 IMPLANT
SCREW SET SINGLE INNER (Screw) ×16 IMPLANT
SPONGE GAUZE 4X4 12PLY (GAUZE/BANDAGES/DRESSINGS) ×2 IMPLANT
SPONGE LAP 4X18 X RAY DECT (DISPOSABLE) ×2 IMPLANT
SPONGE SURGIFOAM ABS GEL 100 (HEMOSTASIS) IMPLANT
STRIP CLOSURE SKIN 1/2X4 (GAUZE/BANDAGES/DRESSINGS) ×2 IMPLANT
SUT VIC AB 0 CT1 18XCR BRD8 (SUTURE) ×2 IMPLANT
SUT VIC AB 0 CT1 8-18 (SUTURE) ×2
SUT VIC AB 2-0 CP2 18 (SUTURE) ×4 IMPLANT
SUT VIC AB 3-0 SH 8-18 (SUTURE) ×4 IMPLANT
SYR 20CC LL (SYRINGE) ×2 IMPLANT
SYR CONTROL 10ML LL (SYRINGE) ×2 IMPLANT
TAPE CLOTH SURG 4X10 WHT LF (GAUZE/BANDAGES/DRESSINGS) ×2 IMPLANT
TOWEL OR 17X24 6PK STRL BLUE (TOWEL DISPOSABLE) ×2 IMPLANT
TOWEL OR 17X26 10 PK STRL BLUE (TOWEL DISPOSABLE) ×2 IMPLANT
TRAP SPECIMEN MUCOUS 40CC (MISCELLANEOUS) ×2 IMPLANT
TRAY FOLEY CATH 14FRSI W/METER (CATHETERS) ×2 IMPLANT
WATER STERILE IRR 1000ML POUR (IV SOLUTION) ×2 IMPLANT

## 2012-02-07 NOTE — Op Note (Addendum)
02/07/2012  3:16 PM  PATIENT:  Kim Mcgee  71 y.o. female  PRE-OPERATIVE DIAGNOSIS:  Lumbar stenosis, Lumbar radiculopathy, Lumbago  POST-OPERATIVE DIAGNOSIS:  Lumbar stenosis, Lumbar radiculopathy, Lumbago  PROCEDURE:  Procedure(s): Decompressive laminectomy decompressing the L2, L3, L4,L5 roots (4L) , PLIF L3-4, L4-5, interbody cages L3-4, L4-5 ,  Segmented pedicle screw fixation L2-L5 ,  Posterior lateral fusion L2-3, L3-4, L4-5 ,   auutograft , allograft , bone marrow aspirate   SURGEON:  Surgeon(s): Clydene Fake, MD Barnett Abu, MD-assist   ANESTHESIA:   general  EBL:  Total I/O In: 2400 [I.V.:2000; Blood:400] Out: 1500 [Urine:500; Blood:1000]  BLOOD ADMINISTERED:400 CC CELLSAVER  DRAINS: none   SPECIMEN:  No Specimen  DICTATION: Patient with back and leg pain trouble walking has known lumbar stenosis and unstable spinal pieces beginning epidural injections in the helped in the past and tremendously every more recently and they've not helped quite as much the patient is decided to proceed to surgery x-rays and MRI were done and patient and brought in for decompressed lamina and fusion  Patient brought into operating room general anesthesia induced patient placed in prone position Wilson frame all pressure points padded. Patient prepped draped sterile fashion segments inject with 20 cc 1% lidocaine with epinephrine. Incision was made midline lumbar spine incision taken the fascia hemostasis obtained with Bovie position fascia was incised and subperiosteal dissection over the to the L5 spinous process lamina to the facets and transverse process of the to 34 and 5 were exposed soaking tractors were placed to an x-ray with markers at which point Suretrans processes of 2 through 5 and this confirmed or positioning.  The laminectomy was then done removing the bottom cardinal to spinous process lamina of L3-4 and top of L5 decompress the central canal there is significant  stenosis spondylosis and spinal pieces and this the decompression was difficult. The medial facetectomies were performed at each level and we decompressed the thecal sac wide and decompress the nerve roots going up the foramen. 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 Attention was then taken to the disc spaces the right side at L45 there was found to space in size discectomy done we prepared the interbody space for interbody fusion with broaches to the disc space to 11 mm and prepare the interspace for interbody fusion 1 level number high both cages and packed with autograft bone and tapped into place again we cannot put on the underside to the conjoined nerve root on the left. We then explored the disc the epidural space and displace at the 34 level there was take the we'll do a week into the interspace distracted up to 9 mm prepare the interbody space for interbody fusion and 9 mm both cages were placed bilaterally at this level and we dissected in the epidural space at the to 3 level of and there was a calcified the disc protrusion centrally the dura was attached to this and stuck on each side and we are not able to mobilize the dura off the disc space and a good posterior decompression the nerve roots exiting to both above and below the surgery were well decompressed we decided not to proceed with discectomy or for interbody fusion at this level. We then make sure he did decompression the thecal sac and the L2 L3-L4 and L5 roots bilaterally used a high-speed drill to decorticate lateral facets and transverse processes bilaterally using fluoroscopy and  intraoperative landmarks, pedicle point for L2-L3 L4-L5 bilaterally decorticated with high-speed drill placed a probe down the pedicle checked with a small ball probe tapped the hole aspirated bone marrow aspirate to placed onto the next also allograft sponge for use later in the case the in and placed  Expedium pedicle screw is 50 mm along in situ these holes. Final AP and lateral fluoroscopic images once screws return in position of the screws and interbody cages. Placed we placed rods and the screw heads placed locking nuts and final tightened those were placed a cross connector the top of the construct and we packed the autograft bone and the bone marrow aspirate and the allograft sponge and the posterior abductors for posterior lateral fusion L2-3 and also at L3-4-4 5. We explored the nerve root redo decompression central canal and the L2-L3 L4-L5 roots we. About solution placed in Gelfoam over the lateral gutters retractors removed we did hemostasis fascia closed with 0 Vicryl interrupted sutures subcutaneous incision closed with 021 through Vicryl interrupted sutures skin closed benzoin Steri-Strips is present but less tender in spine position woken from anesthesia and transferred to recovery   PLAN OF CARE: Admit to inpatient   PATIENT DISPOSITION:  PACU - hemodynamically stable.

## 2012-02-07 NOTE — H&P (Signed)
Subjective: Pt with back and leg pain, trouble walking - has lumbar stenosis and unstable spondylolisthesis - has been getting ESI - they help for short peroids of time, but results lessening   Pt has decided to proceed with surgery  There are no active problems to display for this patient.  Past Medical History  Diagnosis Date  . Hypertension   . Hypothyroidism   . Anxiety   . Asthma   . Shortness of breath     /w walking long distance   . Dysrhythmia   . GERD (gastroesophageal reflux disease)   . Cancer     basal cell CA  . Arthritis     lumbar DDD  . Normal cardiac stress test   . Swallowing difficulty     being seen by ENT- had endoscopy- told that its probably related to past neck surgeries     Past Surgical History  Procedure Date  . Total thyroidectomy     1990's  . Appendectomy   . Tonsillectomy   . Cardiac catheterization     "long time ago"   . Tubal ligation   . Cervical fusion   . Eye surgery     L eye- cataract removed  - /w IOL     Prescriptions prior to admission  Medication Sig Dispense Refill  . albuterol (PROVENTIL HFA;VENTOLIN HFA) 108 (90 BASE) MCG/ACT inhaler Inhale 2 puffs into the lungs every 6 (six) hours as needed. For shortness of breath      . cyclobenzaprine (FLEXERIL) 10 MG tablet Take 1 tablet by mouth Three times daily as needed. For pain      . digoxin (LANOXIN) 0.125 MG tablet Take 0.125 mg by mouth daily after breakfast.       . Fluticasone-Salmeterol (ADVAIR) 500-50 MCG/DOSE AEPB Inhale 1 puff into the lungs 2 (two) times daily.       Marland Kitchen levothyroxine (SYNTHROID, LEVOTHROID) 125 MCG tablet Take 125 mcg by mouth daily before breakfast.       . lovastatin (MEVACOR) 20 MG tablet Take 20 mg by mouth at bedtime.      . metoprolol succinate (TOPROL-XL) 25 MG 24 hr tablet Take 12.5 mg by mouth daily after breakfast.       . naproxen sodium (ANAPROX) 220 MG tablet Take 220 mg by mouth 2 (two) times daily as needed. For pain      . omeprazole  (PRILOSEC) 20 MG capsule Take 20 mg by mouth daily with breakfast.      . traMADol (ULTRAM) 50 MG tablet Take 50 mg by mouth every 6 (six) hours as needed. For pain      . verapamil (CALAN-SR) 240 MG CR tablet Take 240 mg by mouth daily with breakfast.       . oxazepam (SERAX) 10 MG capsule Take 10 mg by mouth at bedtime as needed.       Allergies  Allergen Reactions  . Macrodantin (Nitrofurantoin)     Chest pain  . Penicillins Rash  . Tape Rash    History  Substance Use Topics  . Smoking status: Never Smoker   . Smokeless tobacco: Not on file  . Alcohol Use: No    History reviewed. No pertinent family history.  Review of Systems O/w negative  Objective: Vital signs in last 24 hours: Temp:  [97.9 F (36.6 C)] 97.9 F (36.6 C) (09/09 0630) Pulse Rate:  [57] 57  (09/09 0630) Resp:  [18] 18  (09/09 0630) BP: (131)/(79) 131/79 mmHg (  09/09 0630) SpO2:  [98 %] 98 % (09/09 0630)  + SLR bilat -  Motor/sensation intact - DTR diminished  Data Review -   MRI LUMBAR SPINE WITHOUT CONTRAST  Technique: Multiplanar and multiecho pulse sequences of the lumbar  spine were obtained without intravenous contrast.  Comparison: MRI lumbar spine 01/27/2010. Plain films lumbar spine  05/14/2011.  Findings: Vertebral body height is maintained. There is 0.7 cm of  anterolisthesis of L4 on L5 due to facet arthropathy. Alignment is  otherwise normal. Marrow signal is unremarkable. The patient has  a congenitally narrow central spinal canal. The conus medullaris  is normal in signal and position. Imaged intra-abdominal contents  demonstrate parapelvic bilateral renal cysts, unchanged.  The T11-12 and T12-L1 levels are in the sagittal plane only. There  is mild disc bulging at each level without central canal or  foraminal stenosis. The appearance is unchanged.  L1-2: Minimal disc bulge and mild facet arthropathy. Central  canal and foramina widely patent.  L2-3: Prominent disc bulge causing  moderate central canal and mild  bilateral foraminal narrowing appears unchanged. There is some  facet arthropathy.  L3-4: Broad-based disc bulge with ligamentum flavum thickening and  advanced facet degenerative disease again seen. Facet joint  effusions appear slightly increased. Moderately severe to severe  central canal stenosis is present with narrowing of both lateral  recesses. Foramina are moderately narrowed.  L4-5: Advanced facet degenerative disease resulting  anterolisthesis again seen. The disc is uncovered bulging  ligamentum flavum thickening. Central canal and lateral recesses  are severely narrowed. Right worse than left foraminal narrowing  is seen. The appearance of this level is unchanged.  L5-S1: Disc bulge endplate spur again seen. Moderate narrowing of  the lateral recesses, worse on the left, persists. Central canal  is widely patent. Left worse than right foraminal narrowing again  identified.  IMPRESSION:  1. No marked change in spondylosis appearing worst at L4-5 where  there is advanced facet degenerative disease causing grade 1  anterolisthesis. Central canal and lateral recesses are severely  narrowed at this level and there is also right worse than foraminal  narrowing.  2. Some increase in small facet joint effusions at L3-4 where  there is moderately severe to severe central canal stenosis due to  ligamentum flavum thickening and facet arthropathy. Bilateral  foraminal narrowing at this level again seen.  3. No change in left greater than right lateral recess and  foraminal narrowing at L5-S1  Assessment/Plan: Pt admitted for decompression and fusion L2-5 with instrumentation   Audrey Thull R, MD 02/07/2012 8:29 AM

## 2012-02-07 NOTE — OR Nursing (Signed)
Surgery scheduled for 0730, but Dr. Phoebe Perch had a possible emergency case to come. Surgery was then moved back, and started at 0947.

## 2012-02-07 NOTE — Anesthesia Preprocedure Evaluation (Signed)
Anesthesia Evaluation  Patient identified by MRN, date of birth, ID band Patient awake    Reviewed: Allergy & Precautions, H&P , NPO status , Patient's Chart, lab work & pertinent test results, reviewed documented beta blocker date and time   Airway Mallampati: III TM Distance: >3 FB Neck ROM: Limited    Dental No notable dental hx. (+) Teeth Intact and Dental Advisory Given   Pulmonary shortness of breath, asthma ,  breath sounds clear to auscultation  Pulmonary exam normal       Cardiovascular hypertension, On Home Beta Blockers and On Medications + dysrhythmias Atrial Fibrillation Rhythm:Regular Rate:Normal     Neuro/Psych negative neurological ROS  negative psych ROS   GI/Hepatic Neg liver ROS, GERD-  Medicated and Controlled,  Endo/Other  negative endocrine ROS  Renal/GU negative Renal ROS  negative genitourinary   Musculoskeletal   Abdominal (+) + obese,   Peds  Hematology negative hematology ROS (+)   Anesthesia Other Findings   Reproductive/Obstetrics negative OB ROS                           Anesthesia Physical Anesthesia Plan  ASA: III  Anesthesia Plan: General   Post-op Pain Management:    Induction: Intravenous  Airway Management Planned: Oral ETT  Additional Equipment:   Intra-op Plan:   Post-operative Plan: Extubation in OR  Informed Consent: I have reviewed the patients History and Physical, chart, labs and discussed the procedure including the risks, benefits and alternatives for the proposed anesthesia with the patient or authorized representative who has indicated his/her understanding and acceptance.   Dental advisory given  Plan Discussed with: CRNA  Anesthesia Plan Comments:         Anesthesia Quick Evaluation

## 2012-02-07 NOTE — Progress Notes (Signed)
ANTIBIOTIC CONSULT NOTE - INITIAL  Pharmacy Consult for Vancomycin x 48 hours Indication: post-op prophylaxis  Allergies  Allergen Reactions  . Macrodantin (Nitrofurantoin)     Chest pain  . Penicillins Rash  . Tape Rash    Patient Measurements: Height: 5\' 2"  (157.5 cm) Weight: 188 lb (85.276 kg) IBW/kg (Calculated) : 50.1   Vital Signs: Temp: 97 F (36.1 C) (09/09 1653) Temp src: Oral (09/09 0630) BP: 106/54 mmHg (09/09 1653) Pulse Rate: 80  (09/09 1653) Intake/Output from previous day:   Intake/Output from this shift: Total I/O In: 2400 [I.V.:2000; Blood:400] Out: 1650 [Urine:650; Blood:1000]  Labs: No results found for this basename: WBC:3,HGB:3,PLT:3,LABCREA:3,CREATININE:3 in the last 72 hours Estimated Creatinine Clearance: 65.4 ml/min (by C-G formula based on Cr of 0.59). No results found for this basename: VANCOTROUGH:2,VANCOPEAK:2,VANCORANDOM:2,GENTTROUGH:2,GENTPEAK:2,GENTRANDOM:2,TOBRATROUGH:2,TOBRAPEAK:2,TOBRARND:2,AMIKACINPEAK:2,AMIKACINTROU:2,AMIKACIN:2, in the last 72 hours   Microbiology: Recent Results (from the past 720 hour(s))  SURGICAL PCR SCREEN     Status: Abnormal   Collection Time   02/02/12  2:05 PM      Component Value Range Status Comment   MRSA, PCR POSITIVE (*) NEGATIVE Final    Staphylococcus aureus POSITIVE (*) NEGATIVE Final     Medical History: Past Medical History  Diagnosis Date  . Hypertension   . Hypothyroidism   . Anxiety   . Asthma   . Shortness of breath     /w walking long distance   . Dysrhythmia   . GERD (gastroesophageal reflux disease)   . Cancer     basal cell CA  . Arthritis     lumbar DDD  . Normal cardiac stress test   . Swallowing difficulty     being seen by ENT- had endoscopy- told that its probably related to past neck surgeries     Medications:  Prescriptions prior to admission  Medication Sig Dispense Refill  . albuterol (PROVENTIL HFA;VENTOLIN HFA) 108 (90 BASE) MCG/ACT inhaler Inhale 2 puffs  into the lungs every 6 (six) hours as needed. For shortness of breath      . cyclobenzaprine (FLEXERIL) 10 MG tablet Take 1 tablet by mouth Three times daily as needed. For pain      . digoxin (LANOXIN) 0.125 MG tablet Take 0.125 mg by mouth daily after breakfast.       . Fluticasone-Salmeterol (ADVAIR) 500-50 MCG/DOSE AEPB Inhale 1 puff into the lungs 2 (two) times daily.       Marland Kitchen levothyroxine (SYNTHROID, LEVOTHROID) 125 MCG tablet Take 125 mcg by mouth daily before breakfast.       . lovastatin (MEVACOR) 20 MG tablet Take 20 mg by mouth at bedtime.      . metoprolol succinate (TOPROL-XL) 25 MG 24 hr tablet Take 12.5 mg by mouth daily after breakfast.       . omeprazole (PRILOSEC) 20 MG capsule Take 20 mg by mouth daily with breakfast.      . traMADol (ULTRAM) 50 MG tablet Take 50 mg by mouth every 6 (six) hours as needed. For pain      . verapamil (CALAN-SR) 240 MG CR tablet Take 240 mg by mouth daily with breakfast.       . DISCONTD: naproxen sodium (ANAPROX) 220 MG tablet Take 220 mg by mouth 2 (two) times daily as needed. For pain      . oxazepam (SERAX) 10 MG capsule Take 10 mg by mouth at bedtime as needed.       Assessment: 71 yo female s/p lumbar laminectomy and fusion to begin  vancomycin for 48 hours post-op. Patient received vancomycin 1 g IV at 15:55 today. Renal function is normal at baseline.  Goal of Therapy:  Vancomycin trough level 10-15 mcg/ml  Plan:  -Vancomycin 1 g IV q12h x 4 doses - next 9/10 at 04:00 -Follow-up renal function  Kim Mcgee, [place].D., BCPS Clinical Pharmacist Pager: [phone] 02/07/2012 5:54 PM

## 2012-02-07 NOTE — Preoperative (Signed)
Beta Blockers   Reason not to administer Beta Blockers:B blocker taken 02/07/12 @0445 

## 2012-02-07 NOTE — Transfer of Care (Signed)
Immediate Anesthesia Transfer of Care Note  Patient: Kim Mcgee  Procedure(s) Performed: Procedure(s) (LRB) with comments: POSTERIOR LUMBAR FUSION 3 LEVEL (N/A) - Lumbar two-three, three-four, four-five, Decompressive Laminectomy, posterior lumbar interbody fusion  Patient Location: PACU  Anesthesia Type: General  Level of Consciousness: sedated  Airway & Oxygen Therapy: Patient Spontanous Breathing and Patient connected to nasal cannula oxygen  Post-op Assessment: Report given to PACU RN and Post -op Vital signs reviewed and stable  Post vital signs: Reviewed and stable  Complications: No apparent anesthesia complications

## 2012-02-07 NOTE — Anesthesia Procedure Notes (Signed)
Procedure Name: Intubation Date/Time: 02/07/2012 9:32 AM Performed by: Rossie Muskrat L Pre-anesthesia Checklist: Patient identified, Timeout performed, Emergency Drugs available, Suction available and Patient being monitored Patient Re-evaluated:Patient Re-evaluated prior to inductionOxygen Delivery Method: Circle system utilized Preoxygenation: Pre-oxygenation with 100% oxygen Intubation Type: IV induction Ventilation: Mask ventilation without difficulty Laryngoscope size: GLIDESCOPE. Grade View: Grade I Tube type: Oral Tube size: 7.5 mm Number of attempts: 1 Airway Equipment and Method: Stylet and Video-laryngoscopy

## 2012-02-07 NOTE — Anesthesia Postprocedure Evaluation (Signed)
  Anesthesia Post-op Note  Patient: Kim Mcgee  Procedure(s) Performed: Procedure(s) (LRB) with comments: POSTERIOR LUMBAR FUSION 3 LEVEL (N/A) - Lumbar two-three, three-four, four-five, Decompressive Laminectomy, posterior lumbar interbody fusion  Patient Location: PACU  Anesthesia Type: General  Level of Consciousness: awake  Airway and Oxygen Therapy: Patient Spontanous Breathing and Patient connected to nasal cannula oxygen  Post-op Pain: moderate  Post-op Assessment: Post-op Vital signs reviewed, Patient's Cardiovascular Status Stable, Respiratory Function Stable, Patent Airway and No signs of Nausea or vomiting  Post-op Vital Signs: Reviewed and stable  Complications: No apparent anesthesia complications

## 2012-02-07 NOTE — Interval H&P Note (Signed)
History and Physical Interval Note:  02/07/2012 8:38 AM  Kim Mcgee  has presented today for surgery, with the diagnosis of Lumbar stenosis, Lumbar radiculopathy, Lumbago  The various methods of treatment have been discussed with the patient and family. After consideration of risks, benefits and other options for treatment, the patient has consented to  Procedure(s) (LRB) with comments: POSTERIOR LUMBAR FUSION 3 LEVEL (N/A) - L2-3 L3-4 L4-5 Decompressive Laminectomy, posterior lumbar interbody fusion, Sabre cage, Expedium screws,Cellsaver as a surgical intervention .  The patient's history has been reviewed, patient examined, no change in status, stable for surgery.  I have reviewed the patient's chart and labs.  Questions were answered to the patient's satisfaction.     [name]

## 2012-02-07 NOTE — Progress Notes (Signed)
Patient unable to void at this time

## 2012-02-08 LAB — CBC
Hemoglobin: 11.7 g/dL — ABNORMAL LOW (ref 12.0–15.0)
MCH: 28.3 pg (ref 26.0–34.0)
MCV: 89.3 fL (ref 78.0–100.0)
Platelets: 143 10*3/uL — ABNORMAL LOW (ref 150–400)
RBC: 4.13 MIL/uL (ref 3.87–5.11)
WBC: 9.2 10*3/uL (ref 4.0–10.5)

## 2012-02-08 LAB — BASIC METABOLIC PANEL
CO2: 24 mEq/L (ref 19–32)
Calcium: 8.8 mg/dL (ref 8.4–10.5)
Chloride: 105 mEq/L (ref 96–112)
Creatinine, Ser: 0.5 mg/dL (ref 0.50–1.10)
Glucose, Bld: 125 mg/dL — ABNORMAL HIGH (ref 70–99)
Sodium: 139 mEq/L (ref 135–145)

## 2012-02-08 MED FILL — Sodium Chloride IV Soln 0.9%: INTRAVENOUS | Qty: 1000 | Status: AC

## 2012-02-08 MED FILL — Sodium Chloride Irrigation Soln 0.9%: Qty: 3000 | Status: AC

## 2012-02-08 MED FILL — Heparin Sodium (Porcine) Inj 1000 Unit/ML: INTRAMUSCULAR | Qty: 30 | Status: AC

## 2012-02-08 NOTE — Progress Notes (Signed)
UR COMPLETED  

## 2012-02-08 NOTE — Progress Notes (Signed)
Patient was seen to have redness to the neck and around the nose bridge, not sure whether is a drug reaction, she also complained of tightness at the incision area, incision site observed but looked clean. MD on call notified and will continue to monitor.

## 2012-02-08 NOTE — Progress Notes (Signed)
Patient was unable to void after foley d/ced so bladder scan done, and it showed patient is retaining 600cc , patient was catheterised and 400cc was drained. Will continue to monitor as ordered.

## 2012-02-08 NOTE — Evaluation (Signed)
Occupational Therapy Evaluation Patient Details Name: Kim Mcgee MRN: [id number] DOB: [birth year] Today's Date: 02/08/2012 Time: 4098-1191 OT Time Calculation (min): 23 min  OT Assessment / Plan / Recommendation Clinical Impression  Pt s/p  L2-5 fusion post op day 1. Pt presents with decreased I with ADLs secondary to weakness, pain and decreased awareness of precautions. Pt will benefit from skilled OT in the acute setting to maximize I with ADL and ADL mobility prior to d/c.     OT Assessment  Patient needs continued OT Services    Follow Up Recommendations  No OT follow up    Barriers to Discharge      Equipment Recommendations  Rolling walker with 5" wheels;3 in 1 bedside comode    Recommendations for Other Services    Frequency  Min 2X/week    Precautions / Restrictions Precautions Precautions: Back Precaution Booklet Issued: Yes (comment) Precaution Comments: Pt given precautions handout, educated on 3/3 back precautions, and educated on log rolling technique for all bed mobility. Required Braces or Orthoses: Spinal Brace Spinal Brace: Thoracolumbosacral orthotic;Applied in sitting position Restrictions Weight Bearing Restrictions: No   Pertinent Vitals/Pain Pt with nausea (RN informed and provided medication) and back pain. Pt did not rate back pain but was repositioned for pain relief    ADL  Eating/Feeding: Performed;Independent Where Assessed - Eating/Feeding: Chair Grooming: Performed;Minimal assistance Where Assessed - Grooming: Supported standing Lower Body Bathing: Simulated;Moderate assistance Where Assessed - Lower Body Bathing: Supported sit to stand Lower Body Dressing: Simulated;+1 Total assistance Where Assessed - Lower Body Dressing: Supported sit to stand Toilet Transfer: Mining engineer Method: Sit to Barista:  (from chair with armrests) Toileting - Clothing Manipulation and Hygiene:  Simulated;Moderate assistance Where Assessed - Toileting Clothing Manipulation and Hygiene: Standing Equipment Used: Back brace;Rolling walker Transfers/Ambulation Related to ADLs: Pt min A with sit to stand and with turns during ambulation. VC to maintain precautions ADL Comments: Able to recall 1/3 back precautions. Educated on 3/3    OT Diagnosis: Generalized weakness;Acute pain  OT Problem List: Decreased activity tolerance;Impaired balance (sitting and/or standing);Pain;Decreased knowledge of use of DME or AE;Decreased knowledge of precautions OT Treatment Interventions: Self-care/ADL training;DME and/or AE instruction;Therapeutic activities;Patient/family education;Balance training   OT Goals Acute Rehab OT Goals OT Goal Formulation: With patient/family Time For Goal Achievement: 02/15/12 Potential to Achieve Goals: Good ADL Goals Pt Will Perform Grooming: Independently;Standing at sink ADL Goal: Grooming - Progress: Goal set today Pt Will Perform Upper Body Bathing: with set-up;Sitting in shower ADL Goal: Upper Body Bathing - Progress: Goal set today Pt Will Perform Lower Body Bathing: with supervision;Sit to stand in shower;Sitting, chair ADL Goal: Lower Body Bathing - Progress: Goal set today Pt Will Perform Lower Body Dressing: with modified independence;with adaptive equipment;Sit to stand from chair;Sit to stand from bed ADL Goal: Lower Body Dressing - Progress: Goal set today Pt Will Transfer to Toilet: with modified independence;Ambulation;with DME ADL Goal: Toilet Transfer - Progress: Goal set today Pt Will Perform Toileting - Hygiene: with modified independence;with adaptive equipment;Sit to stand from 3-in-1/toilet ADL Goal: Toileting - Hygiene - Progress: Goal set today Pt Will Perform Tub/Shower Transfer: Shower transfer;with supervision;Ambulation;with DME ADL Goal: Tub/Shower Transfer - Progress: Goal set today Additional ADL Goal #1: Pt verbalize/generalize 3/3  back precautions for use with all functional activities.  ADL Goal: Additional Goal #1 - Progress: Goal set today  Visit Information  Last OT Received On: 02/08/12 Assistance Needed: +1    Subjective Data  Subjective: I don't know why I'm feeling nauseous all of a sudden. Patient Stated Goal: Return home with husband   Prior Functioning  Vision/Perception  Home Living Lives With: Spouse Available Help at Discharge: Family;Available 24 hours/day Type of Home: House Home Access: Stairs to enter [employer] of Steps: 1 Entrance Stairs-Rails: None Home Layout: One level Bathroom Shower/Tub: Health visitor: Standard Home Adaptive Equipment: Built-in shower seat Prior Function Level of Independence: Independent Able to Take Stairs?: Yes Driving: Yes Vocation: Retired Musician: No difficulties Dominant Hand: Right      Cognition  Overall Cognitive Status: Appears within functional limits for tasks assessed/performed Arousal/Alertness: Awake/alert Orientation Level: Appears intact for tasks assessed Behavior During Session: [facility] for tasks performed    Extremity/Trunk Assessment Right Upper Extremity Assessment RUE ROM/Strength/Tone: WFL for tasks assessed RUE Sensation: WFL - Light Touch RUE Coordination: WFL - gross/fine motor Left Upper Extremity Assessment LUE ROM/Strength/Tone: WFL for tasks assessed LUE Sensation: WFL - Light Touch LUE Coordination: WFL - gross/fine motor Right Lower Extremity Assessment RLE ROM/Strength/Tone: WFL for tasks assessed Left Lower Extremity Assessment LLE ROM/Strength/Tone: WFL for tasks assessed Trunk Assessment Trunk Assessment: Normal   Mobility    Sit to Stand: 4: Min assist;With armrests;From chair/3-in-1 Sit to Stand: Patient Percentage: 60% Stand to Sit: 4: Min assist;With upper extremity assist;To bed Details for Transfer Assistance: VC to scoot forward to EOC and for hand  placement        Exercise     Balance Balance Balance Assessed: Yes Static Sitting Balance Static Sitting - Balance Support: Right upper extremity supported Static Sitting - Level of Assistance: 5: Stand by assistance Static Sitting - Comment/# of Minutes: Pt sat EOB with SBA, waiting for dizziness pt experienced upon sitting up to resolve.   End of Session OT - End of Session Equipment Utilized During Treatment: Gait belt  GO     Edgerrin Correia 02/08/2012, 10:43 AM

## 2012-02-08 NOTE — Evaluation (Signed)
I have read and agree with the below assessment and plan.   Noach Calvillo Helen Whitlow PT, DPT Pager: [phone] 

## 2012-02-08 NOTE — Evaluation (Signed)
Physical Therapy Evaluation Patient Details Name: Kim Mcgee MRN: [id number] DOB: 06/27/1940 Today's Date: 02/08/2012 Time: 4098-1191 PT Time Calculation (min): 41 min  PT Assessment / Plan / Recommendation Clinical Impression  Pt is a 71 yo female s/p L2-5 fusion post op day 1. Pt presents to physical therapy evaluation with decreased mobility secondary to surgery and pain and will benefit from skilled physical therapy in the acute care setting to maximize independence prior to discharge home. Recommending HHPT at discharge.    PT Assessment  Patient needs continued PT services    Follow Up Recommendations  Home health PT;Supervision for mobility/OOB    Barriers to Discharge        Equipment Recommendations  Rolling walker with 5" wheels    Recommendations for Other Services     Frequency Min 5X/week    Precautions / Restrictions Precautions Precautions: Back Precaution Booklet Issued: Yes (comment) Precaution Comments: Pt given precautions handout, educated on 3/3 back precautions, and educated on log rolling technique for all bed mobility. Required Braces or Orthoses: Spinal Brace Spinal Brace: Thoracolumbosacral orthotic;Applied in sitting position Restrictions Weight Bearing Restrictions: No   Pertinent Vitals/Pain Upon sitting up EOB, pt c/o dizziness. BP was 119/53. After sitting 2-3 minutes, BP 129/55. Upon standing, pt asymptomatic. Nursing notified.     Mobility  Bed Mobility Bed Mobility: Rolling Right;Right Sidelying to Sit Rolling Right: 4: Min assist;With rail Right Sidelying to Sit: 4: Min assist;With rails;HOB flat Details for Bed Mobility Assistance: Pt requried verbal cueing for sequencing and maintianing back precautions. Facilitation at hips with pad to assist with rolling and at upper trunk to come to full upright sitting EOB. Transfers Transfers: Sit to Stand;Stand to Sit Sit to Stand: 1: +2 Total assist;From bed Sit to Stand: Patient Percentage:  60% Stand to Sit: 4: Min assist;With armrests;With upper extremity assist;To chair/3-in-1 Details for Transfer Assistance: Pt requried facilitation at sacrum and upper trunk to come to full upright standing and verbal cueing for safe hand placement. Min assist with stand ->sit for conrtolled descent. Ambulation/Gait Ambulation/Gait Assistance: 4: Min assist Ambulation Distance (Feet): 10 Feet Assistive device: Rolling walker Ambulation/Gait Assistance Details: Pt requried verbal cueing for safe hand placement and sequencing using RW, as well as verbal and tactile cueing for upright posture. Gait Pattern: Step-through pattern;Decreased stride length;Decreased trunk rotation;Trunk flexed General Gait Details: decreased step height bil Stairs: No    Exercises     PT Diagnosis: Difficulty walking;Abnormality of gait;Acute pain  PT Problem List: Decreased activity tolerance;Decreased balance;Decreased mobility;Decreased coordination;Decreased knowledge of use of DME;Decreased knowledge of precautions PT Treatment Interventions: DME instruction;Gait training;Stair training;Functional mobility training;Therapeutic activities;Therapeutic exercise;Balance training;Neuromuscular re-education;Patient/family education   PT Goals Acute Rehab PT Goals PT Goal Formulation: With patient Time For Goal Achievement: 02/15/12 Potential to Achieve Goals: Good Pt will Roll Supine to Right Side: with modified independence PT Goal: Rolling Supine to Right Side - Progress: Goal set today Pt will Roll Supine to Left Side: with modified independence PT Goal: Rolling Supine to Left Side - Progress: Goal set today Pt will go Supine/Side to Sit: with modified independence PT Goal: Supine/Side to Sit - Progress: Goal set today Pt will go Sit to Stand: with modified independence PT Goal: Sit to Stand - Progress: Goal set today Pt will go Stand to Sit: with modified independence PT Goal: Stand to Sit - Progress: Goal  set today Pt will Ambulate: >150 feet;with supervision;with least restrictive assistive device PT Goal: Ambulate - Progress: Goal set today Pt  will Go Up / Down Stairs: 1-2 stairs;with supervision;with rail(s) PT Goal: Up/Down Stairs - Progress: Goal set today Additional Goals Additional Goal #1: Pt will recall 3/3 back precautions and demonstrate during all functional mobility. PT Goal: Additional Goal #1 - Progress: Goal set today  Visit Information  Last PT Received On: 02/08/12 Assistance Needed: +1    Subjective Data  Subjective: "I haven't been out of bed yet."   Prior Functioning  Home Living Lives With: Spouse Available Help at Discharge: Family;Available 24 hours/day Type of Home: House Home Access: Stairs to enter [employer] of Steps: 1 Entrance Stairs-Rails: None Home Layout: One level Bathroom Shower/Tub: Health visitor: Standard Home Adaptive Equipment: Built-in shower seat Prior Function Level of Independence: Independent Able to Take Stairs?: Yes Driving: Yes Vocation: Retired Musician: No difficulties    Cognition  Overall Cognitive Status: Appears within functional limits for tasks assessed/performed Arousal/Alertness: Awake/alert Orientation Level: Appears intact for tasks assessed Behavior During Session: [facility] for tasks performed    Extremity/Trunk Assessment Right Upper Extremity Assessment RUE ROM/Strength/Tone: [facility] for tasks assessed Left Upper Extremity Assessment LUE ROM/Strength/Tone: WFL for tasks assessed Right Lower Extremity Assessment RLE ROM/Strength/Tone: [facility] for tasks assessed Left Lower Extremity Assessment LLE ROM/Strength/Tone: WFL for tasks assessed Trunk Assessment Trunk Assessment: Normal   Balance Balance Balance Assessed: Yes Static Sitting Balance Static Sitting - Balance Support: Right upper extremity supported Static Sitting - Level of Assistance: 5: Stand by  assistance Static Sitting - Comment/# of Minutes: Pt sat EOB with SBA, waiting for dizziness pt experienced upon sitting up to resolve.  End of Session PT - End of Session Equipment Utilized During Treatment: Gait belt;Back brace Activity Tolerance: Patient limited by pain;Patient tolerated treatment well Patient left: in chair;with call bell/phone within reach Nurse Communication: Mobility status  GP     Kim Mcgee 02/08/2012, 9:05 AM

## 2012-02-08 NOTE — Progress Notes (Signed)
Doing well. C/o incisional pain. Not OOB yet  Temp:  [97 F (36.1 C)-98 F (36.7 C)] 98 F (36.7 C) (09/10 0600) Pulse Rate:  [59-109] 109  (09/10 0600) Resp:  [14-28] 19  (09/10 0751) BP: (106-149)/(54-111) 144/57 mmHg (09/10 0600) SpO2:  [80 %-97 %] 97 % (09/10 0751) FiO2 (%):  [68 %-98 %] 68 % (09/10 0751) Weight:  [85.276 kg (188 lb)] 85.276 kg (188 lb) (09/09 1700) Good strength and sensation Incision CDI  Plan: Increase activity

## 2012-02-09 ENCOUNTER — Inpatient Hospital Stay (HOSPITAL_COMMUNITY): Payer: Medicare Other

## 2012-02-09 DIAGNOSIS — R0902 Hypoxemia: Secondary | ICD-10-CM

## 2012-02-09 DIAGNOSIS — M4800 Spinal stenosis, site unspecified: Secondary | ICD-10-CM

## 2012-02-09 DIAGNOSIS — R079 Chest pain, unspecified: Secondary | ICD-10-CM

## 2012-02-09 DIAGNOSIS — Z9889 Other specified postprocedural states: Secondary | ICD-10-CM

## 2012-02-09 DIAGNOSIS — R0602 Shortness of breath: Secondary | ICD-10-CM | POA: Diagnosis present

## 2012-02-09 LAB — CK TOTAL AND CKMB (NOT AT ARMC)
CK, MB: 2.3 ng/mL (ref 0.3–4.0)
Total CK: 411 U/L — ABNORMAL HIGH (ref 7–177)

## 2012-02-09 LAB — TROPONIN I: Troponin I: <0.3 ng/mL (ref <0.30)

## 2012-02-09 MED ORDER — MAGNESIUM HYDROXIDE 400 MG/5ML PO SUSP
30.0000 mL | Freq: Every day | ORAL | Status: DC | PRN
  Administered 2012-02-09 – 2012-02-18 (×3): 30 mL via ORAL
  Filled 2012-02-09 (×3): qty 30

## 2012-02-09 MED ORDER — IOHEXOL 350 MG/ML SOLN
100.0000 mL | Freq: Once | INTRAVENOUS | Status: AC | PRN
  Administered 2012-02-09: 100 mL via INTRAVENOUS

## 2012-02-09 NOTE — Progress Notes (Signed)
Doing well. C/o appropriate incisional soreness. + Nausea /vomiting Amb a little,  voiding well No BM, no flatus  Temp:  [97.6 F (36.4 C)-98.5 F (36.9 C)] 97.6 F (36.4 C) (09/11 0611) Pulse Rate:  [90-111] 97  (09/11 0611) Resp:  [16-20] 19  (09/11 0611) BP: (129-144)/(53-58) 134/58 mmHg (09/11 0611) SpO2:  [93 %-100 %] 93 % (09/11 0611) FiO2 (%):  [49 %-87 %] 49 % (09/10 2344) Good strength and sensation Incision CDI  Plan: Increase activity

## 2012-02-09 NOTE — Progress Notes (Signed)
From the skull to see the patient secondary to respiratory distress. The patient is a 71 year old female patient of Dr. Phoebe Perch is 2 days postop status post lumbar fusion, began having difficulty breathing now in the lateral part of this afternoon around 5 vital signs remained stable throughout the entire course patient was being evaluated by her nurse as well as rapid response initial EKG was unremarkable EKG 1 hour and a half later revealed a second degree A-V block that was new. Patient did drop her sats were responded well to just 4 L of nasal cannula oxygen. Currently the patient says that this is a problem for chronically with some difficulty breathing and difficulty swallowing she is in the process of being evaluated by her nose and throat and she also has a history of asthma. She denies any new problems with her arms her back or her legs.  Clinical exam she is awake and alert she is in mild distress on 4 L nasal cannula maintaining her saturations are 97%. Her a lot she is nonfocal she does have some respiratory wheezes she has a chest x-ray that revealed some bibasilar atelectasis laboratory data still pending.  Assessment plan: 71 year female 2 days postop lumbar fusion in mild respiratory distress. Differential should include her chronic history of asthma with some potential fluid overload however new findings are EKG certainly raises the question of a primary cardiac event she has a history of cardiac disease followed by cardiologist in [place] supposedly her ejection fraction is 55% and is been diagnosed with grade 1 diastolic dysfunction. The other in the differential would be a pulmonary embolus so extensive that taken is moving her down to 3100 contacted critical-care medicine consult on the patient I in addition I also called cardiology, I have also ordered a CT angiogram chest as well as cardiac enzymes.

## 2012-02-09 NOTE — Consult Note (Signed)
CARDIOLOGY CONSULT NOTE  Patient ID: Kim Mcgee MRN: [id number] DOB/AGE: [birth year] 71 y.o.  Admit date: 02/07/2012 Referring Physician: Dr Wynetta Emery Primary Physician Primary Cardiologist Reason for Consultation: Shortness of breath, EKG changes  HPI: 71 year-old woman with Lumbar stenosis/radiculopathy underwent decompressive laminectomy 9/9. She was having a good recovery until developing acute chest pain and shortness of breath tonight. 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 She has undergone stat CT chest to rule out PE and cardiology asked to eval for possible acute cardiac syndrome or CHF.  I have reviewed her preop eval in detail. An echo showed normal LV function with Grade 1 diastolic dysfunction with LVEF 55%. She has a history of a 'normal' cardiac cath and last stress Cardiolite in 2010 showed no ischemia. She was not having exertional chest pain prior to surgery.  She developed upper chest pain tonight after having 'trouble swallowing' and feeling like she was 'choking.' Also complains of shortness of breath. No orthopnea, PND, or syncope.  Past Medical History  Diagnosis Date  . Hypertension   . Hypothyroidism   . Anxiety   . Asthma   . Shortness of breath     /w walking long distance   . Dysrhythmia   . GERD (gastroesophageal reflux disease)   . Cancer     basal cell CA  . Arthritis     lumbar DDD  . Normal cardiac stress test   . Swallowing difficulty     being seen by ENT- had endoscopy- told that its probably related to past neck surgeries      Past Surgical History  Procedure Date  . Total thyroidectomy     1990's  . Appendectomy   . Tonsillectomy   . Cardiac catheterization     "long time ago"   . Tubal ligation   . Cervical fusion   . Eye surgery     L eye- cataract removed  - /w IOL      History reviewed. No pertinent family history.  Social History: History   Social History  . Marital Status: Married    Spouse Name: N/A   Number of Children: N/A  . Years of Education: N/A   Occupational History  . Not on file.   Social History Main Topics  . Smoking status: Never Smoker   . Smokeless tobacco: Not on file  . Alcohol Use: No  . Drug Use: No  . Sexually Active:    Other Topics Concern  . Not on file   Social History Narrative  . No narrative on file     Prescriptions prior to admission  Medication Sig Dispense Refill  . albuterol (PROVENTIL HFA;VENTOLIN HFA) 108 (90 BASE) MCG/ACT inhaler Inhale 2 puffs into the lungs every 6 (six) hours as needed. For shortness of breath      . cyclobenzaprine (FLEXERIL) 10 MG tablet Take 1 tablet by mouth Three times daily as needed. For pain      . digoxin (LANOXIN) 0.125 MG tablet Take 0.125 mg by mouth daily after breakfast.       . Fluticasone-Salmeterol (ADVAIR) 500-50 MCG/DOSE AEPB Inhale 1 puff into the lungs 2 (two) times daily.       Marland Kitchen levothyroxine (SYNTHROID, LEVOTHROID) 125 MCG tablet Take 125 mcg by mouth daily before breakfast.       . lovastatin (MEVACOR) 20 MG tablet Take 20 mg by mouth at bedtime.      . metoprolol succinate (TOPROL-XL) 25 MG 24 hr tablet Take  12.5 mg by mouth daily after breakfast.       . omeprazole (PRILOSEC) 20 MG capsule Take 20 mg by mouth daily with breakfast.      . traMADol (ULTRAM) 50 MG tablet Take 50 mg by mouth every 6 (six) hours as needed. For pain      . verapamil (CALAN-SR) 240 MG CR tablet Take 240 mg by mouth daily with breakfast.       . DISCONTD: naproxen sodium (ANAPROX) 220 MG tablet Take 220 mg by mouth 2 (two) times daily as needed. For pain      . oxazepam (SERAX) 10 MG capsule Take 10 mg by mouth at bedtime as needed.        ROS: General: no fevers/chills/night sweats Eyes: no blurry vision, diplopia, or amaurosis ENT: no sore throat or hearing loss Resp: no cough, wheezing, or hemoptysis CV: no edema or palpitations GI: no abdominal pain, nausea, vomiting, diarrhea, or constipation GU: no dysuria,  frequency, or hematuria Skin: no rash Neuro: no headache, numbness, tingling, or weakness of extremities Musculoskeletal: positive for back pain Heme: no bleeding, DVT, or easy bruising Endo: no polydipsia or polyuria    Physical Exam: Blood pressure 121/53, pulse 96, temperature 98.7 F (37.1 C), temperature source Oral, resp. rate 20, height 5\' 2"  (1.575 m), weight 85.276 kg (188 lb), SpO2 88.00%.  Pt is alert and oriented, WD, WN, in no distress. HEENT: normal Neck: JVP normal. Carotid upstrokes normal without bruits. No thyromegaly. Lungs: equal expansion, clear bilaterally CV: Apex is discrete and nondisplaced, RRR without murmur or gallop Abd: soft, mild suprapubic tenderness Back: no CVA tenderness Ext: no C/C/E        Femoral pulses 2+= without bruits        DP/PT pulses intact and = Skin: warm and dry without rash Neuro: CNII-XII intact             Strength intact = bilaterally  Labs:   Lab Results  Component Value Date   WBC 9.2 02/08/2012   HGB 11.7* 02/08/2012   HCT 36.9 02/08/2012   MCV 89.3 02/08/2012   PLT 143* 02/08/2012    Lab 02/08/12 0455  NA 139  K 4.5  CL 105  CO2 24  BUN 5*  CREATININE 0.50  CALCIUM 8.8  PROT --  BILITOT --  ALKPHOS --  ALT --  AST --  GLUCOSE 125*   No results found for this basename: CKTOTAL, CKMB, CKMBINDEX, TROPONINI    No results found for this basename: CHOL   No results found for this basename: HDL   No results found for this basename: LDLCALC   No results found for this basename: TRIG   No results found for this basename: CHOLHDL   No results found for this basename: LDLDIRECT      Radiology: Dg Chest Port 1 View  02/09/2012  *RADIOLOGY REPORT*  Clinical Data: Difficulty breathing.  PORTABLE CHEST - 1 VIEW  Comparison: 02/02/2012  Findings: Heart is borderline in size.  Low lung volumes.  Minimal bibasilar atelectasis.  No effusions.  No acute bony abnormality.  IMPRESSION: Low lung volumes with bibasilar  atelectasis.   Original Report Authenticated By: Cyndie Chime, M.D.     EKG: Sinus rhythm with 1st degree AV block 97 bpm. Prior EKG from 18:28 tonight showed Mobitz 1 block without ST-T changes.  CXR: PORTABLE CHEST - 1 VIEW  Comparison: 02/02/2012  Findings: Heart is borderline in size. Low lung volumes. Minimal  bibasilar atelectasis. No effusions. No acute bony abnormality.  IMPRESSION:  Low lung volumes with bibasilar atelectasis.  CT Chest: Findings: Dependent and bibasilar atelectasis noted within the  lungs. Trace pleural effusions. Heart is borderline in size.  Aorta is normal caliber. No mediastinal, hilar, or axillary  adenopathy. Visualized thyroid and chest wall soft tissues  unremarkable.  No filling defects in the pulmonary arteries to suggest pulmonary  emboli.  Imaging into the upper abdomen shows no acute findings. Small low-  density area within the dome of the liver, likely small cyst.  No acute bony abnormality. Degenerative changes in the thoracic  spine.  IMPRESSION:  No evidence of pulmonary embolus.  Extensive dependent and bibasilar atelectasis. Trace bilateral  effusions.   ASSESSMENT AND PLAN:  1. Chest pain/shortness of breath. No clear findings of ACS or volume overload on exam. CT reviewed and shows no evidence of PE. Recommend serial cardiac enzymes to rule out MI. Also would check a BNP level and 2D echo. If enzymes are negative, would not pursue further cardiac eval at this point. Will follow-up in the am.  2. Second degree, Mobitz 1 AV block. Pt on AV nodal blockers. No indication of symptomatic arrhythmia or high-grade heart block. Follow on tele. Continue current meds as patient also has hx of apparent SVT.  Tonny Bollman 02/09/2012, 8:23 PM

## 2012-02-09 NOTE — Progress Notes (Addendum)
Called at 1709 to assist with patient with sudden onset chest pressure and SOB.  Patient has history of probable esophageal spasm.  Here with lumbar surgery.   On arrival patient awake and alert denies chest pain, no SOB at this time, states she feels a little sore in her chest when she swallows.  She states the discomfort felt the same as it did in July when she had episode with her esophagus. Placed on telemetry monitor. Warm and dry.  Pink.  Bil BS equal and clear.  Abd slightly firm -  No pain.  Back DDI - husband states it was changed earlier today with large amount "red drainage."  BP 148/72 HR 112 SR RR 20 O2 sats 98% on 2 liter nasal cannula - has been on n/c since surgery. 12 Lead EKG reads JR but is SR .   Patient repositioned.  Dr. Wynetta Emery called PTA - aware of findings. Discussed pain control with patient.  She states she has anxiety and feels something may help her.  Given Flexeril by [facility] RN. Patient remains with stable vital signs.  MD ordered [facility] - results noted. At 1815 monitor tech noted change in patients rhythm.  Now in first degree HB with PR .26.  Repeat 12 lead EKG done - confirms prolonged PR.  Now patient with decreased O2 sats - 88% on 2 liter nasal cannula.  Denies any CP or SOB.  Nasal cannula to 4 liter - sats 90-94%.  BP 124/53.  RR 20.  Dr. Wynetta Emery on unit.  Update given.  Stat orders noted.  Patient to transfer to ICU after CTA to r/o PE.  Denies leg pain. 1930 116/50  HR 99 O2 sats 93%  RR 18.  Handoff to [place], Charity fundraiser for night time RRT.  Patient without pain.   Awaiting VAST team for IV for angio - patient with very poor veins - left arm very bruised from infiltrates.

## 2012-02-09 NOTE — Progress Notes (Signed)
Pt attempted to use bedpan with no success. In & Out cath done with 1450 cc of urine return.   Cyndie Chime Kincaid

## 2012-02-09 NOTE — Progress Notes (Addendum)
Pt's husband called and said that his wife was having trouble breathing, upon entering the room, the patient's PCA pump monitoring showed that the patient was tachycardiac (122) but her SpO2 was 97% on 2L via [place].  Patient stated she was not able to breathe and had pressure in the upper portion of the chest bilaterally,  VSS,  I remained with the patient, asked if she had respiratory conditions, she mentioned asthma, I looked that she could PRN albuterol so I gave her some.  She still did not feel well, so I called the Rapid Response Nurse, She advised me to get an EKG.  First EKG was unremarkable,  RR RN came to bedside.  MD paged, RR RN and I are at the beside, and we will continue to monitor the patient

## 2012-02-09 NOTE — Progress Notes (Signed)
I have read and agree with the below treatment note and plan. Beretta Ginsberg Helen Whitlow PT, DPT Pager: [phone] 

## 2012-02-09 NOTE — Progress Notes (Signed)
Occupational Therapy Treatment Patient Details Name: Kim Mcgee MRN: [id number] DOB: Mar 14, 1941 Today's Date: 02/09/2012 Time: 4098-1191 OT Time Calculation (min): 42 min  OT Assessment / Plan / Recommendation Comments on Treatment Session Pt. is progressing very slowly.  Pt. still requiring max A for BADLs, and is limited with functional mobility as pt. fatigues quickly.  Pain 5-7/10 throughout session    Follow Up Recommendations  Home health OT;Supervision/Assistance - 24 hour    Barriers to Discharge       Equipment Recommendations  Rolling walker with 5" wheels;3 in 1 bedside comode    Recommendations for Other Services    Frequency Min 2X/week   Plan Discharge plan needs to be updated    Precautions / Restrictions Precautions Precautions: Back Precaution Comments: pt. only able to verbalize 1/3 back precautions.  Pt. was re-instructed in precautions Required Braces or Orthoses: Spinal Brace Spinal Brace: Applied in sitting position Restrictions Weight Bearing Restrictions: No   Pertinent Vitals/Pain     ADL  Lower Body Dressing: Simulated;Performed;+1 Total assistance Where Assessed - Lower Body Dressing: Unsupported sitting Toilet Transfer: Simulated;Minimal assistance Toilet Transfer Method: Stand pivot Toilet Transfer Equipment: Other (comment) (ambulate short distance to recliner) Toileting - Clothing Manipulation and Hygiene: Simulated;Moderate assistance Where Assessed - Toileting Clothing Manipulation and Hygiene: Standing Equipment Used: Back brace;Rolling walker Transfers/Ambulation Related to ADLs: Pt. moved sit to stand with min A and increased time.  Min A to ambulate 5' to chair.   ADL Comments: Pt. instructed in use of AE.  Pt. required mod A and max cues to remove socks.  Pt. unable to don socks due to pain and fatigiue, while sitting in recliner.  Pt. moved to EOB with min A.  Bed pad and gown with significant draninage.  RN notified and performed  dressing change.  Pt. Moving slowly    OT Diagnosis:    OT Problem List:   OT Treatment Interventions:     OT Goals ADL Goals ADL Goal: Lower Body Dressing - Progress: Progressing toward goals (very slowly) ADL Goal: Toilet Transfer - Progress: Progressing toward goals ADL Goal: Additional Goal #1 - Progress: Progressing toward goals (very slowly)  Visit Information  Last OT Received On: 02/09/12 Assistance Needed: +1    Subjective Data      Prior Functioning       Cognition  Overall Cognitive Status: Appears within functional limits for tasks assessed/performed Arousal/Alertness: Awake/alert Orientation Level: Appears intact for tasks assessed Behavior During Session: [facility] for tasks performed    Mobility  Shoulder Instructions Bed Mobility Bed Mobility: Rolling Right;Right Sidelying to Sit;Sitting - Scoot to Edge of Bed Rolling Right: 4: Min assist;With rail Right Sidelying to Sit: 4: Min assist;With rails;HOB flat Sitting - Scoot to Edge of Bed: 4: Min guard;With rail Details for Bed Mobility Assistance: Pt. requires verbal cueing for technique and sequencing Transfers Transfers: Sit to Stand;Stand to Sit Sit to Stand: 4: Min assist;With upper extremity assist;From bed Stand to Sit: 4: Min assist;With upper extremity assist;With armrests;To chair/3-in-1 Details for Transfer Assistance: Pt. requires cues for proper hand placement, and requires increased time       Exercises      Balance Balance Balance Assessed: No   End of Session OT - End of Session Equipment Utilized During Treatment: Back brace Activity Tolerance: Patient limited by pain;Patient limited by fatigue Patient left: in chair;with call bell/phone within reach;with family/visitor present Nurse Communication: Patient requests pain meds;Other (comment) (Need for dsg change)  GO  Kim Mcgee M 02/09/2012, 1:25 PM

## 2012-02-09 NOTE — Progress Notes (Signed)
Physical Therapy Treatment Patient Details Name: Raynisha Avilla MRN: [id number] DOB: 06-Feb-1941 Today's Date: 02/09/2012 Time: 4098-1191 PT Time Calculation (min): 24 min  PT Assessment / Plan / Recommendation Comments on Treatment Session  Pt with improved mobility today, increasing ambulation distance although still requiring verbal cueing for safe use of RW. Pt's husband present for session and educated on donning/doffing TLSO. Pt's O2 dropped to 86 during ambulation but came back up to 92 with several deep breaths. See vitals for details.    Follow Up Recommendations  Home health PT;Supervision for mobility/OOB    Barriers to Discharge        Equipment Recommendations  Rolling walker with 5" wheels;3 in 1 bedside comode    Recommendations for Other Services    Frequency Min 5X/week   Plan Discharge plan remains appropriate;Frequency remains appropriate    Precautions / Restrictions Precautions Precautions: Back Precaution Comments: Pt able to verbalize 1/3 back precautions. Required Braces or Orthoses: Spinal Brace Spinal Brace: TLSO; Applied in sitting position Restrictions Weight Bearing Restrictions: No   Pertinent Vitals/Pain Pt's O2 dropped to 86 while ambulating but after taking several deep breaths, back up to 92. After ambulation with pt sitting in chair and back on nasal canula, O2 96. Nursing notified.    Mobility  Bed Mobility Bed Mobility: Rolling Right;Right Sidelying to Sit Rolling Right: 4: Min assist;With rail Right Sidelying to Sit: 4: Min assist;With rails;HOB flat Details for Bed Mobility Assistance: Pt requried verbal cueing for sequencing to maintain back precautions during rolling and facilitation at hips with pad to assist with rolling nad at upper trunk to come to full upright sitting EOB. Transfers Transfers: Sit to Stand;Stand to Sit Sit to Stand: 4: Min assist;With upper extremity assist;From bed Stand to Sit: 4: Min assist;With upper extremity  assist;With armrests;To chair/3-in-1 Details for Transfer Assistance: Pt required verbal cueing for safe hand placement and facilitation at sacrum and upper trunk to come to full upright standing. Ambulation/Gait Ambulation/Gait Assistance: 4: Min assist Ambulation Distance (Feet): 50 Feet Assistive device: Rolling walker Ambulation/Gait Assistance Details: Pt requried verbal cueing for safe use of RW and verbal cueing for upright posture. O2 monitored during ambulation (see vitals for details). Gait Pattern: Step-through pattern;Decreased stride length;Decreased trunk rotation;Trunk flexed General Gait Details: decreased step height bil Stairs: No     PT Goals Acute Rehab PT Goals PT Goal: Rolling Supine to Right Side - Progress: Progressing toward goal PT Goal: Supine/Side to Sit - Progress: Progressing toward goal PT Goal: Sit to Stand - Progress: Progressing toward goal PT Goal: Stand to Sit - Progress: Progressing toward goal PT Goal: Ambulate - Progress: Progressing toward goal Additional Goals PT Goal: Additional Goal #1 - Progress: Progressing toward goal  Visit Information  Last PT Received On: 02/09/12 Assistance Needed: +1    Subjective Data  Subjective: "I didn't really get out of ebd yesterday."   Cognition  Overall Cognitive Status: Appears within functional limits for tasks assessed/performed Arousal/Alertness: Awake/alert Orientation Level: Appears intact for tasks assessed Behavior During Session: [facility] for tasks performed    Balance  Balance Balance Assessed: No  End of Session PT - End of Session Equipment Utilized During Treatment: Gait belt;Back brace Activity Tolerance: Patient limited by fatigue;Patient tolerated treatment well Patient left: in chair;with call bell/phone within reach;with family/visitor present Nurse Communication: Mobility status   GP     Trini Soldo 02/09/2012, 10:37 AM

## 2012-02-09 NOTE — Consult Note (Signed)
Name: Kim Mcgee MRN: [id number] DOB: [birth year]  LOS: 2  CRITICAL CARE ADMISSION NOTE  History of Present Illness: This is a 71 year old female with a past medical history significant for GERD who was admitted on 02/07/2012 for an elective decompressive lumbar laminectomy. She tolerated the procedure well without major complication but on 02/09/2012 in the late afternoon she developed the acute onset of shortness of breath. She states that she was sitting in a chair talking to her family and then tried to swallow some saliva but then immediately felt the sensation that she could not breathe. She felt that she was choking and air could not move. This resolved and was associated with some chest discomfort. Cardiology was consulted due to possible EKG changes but they felt these were nonspecific. Pulmonary critical care was consulted for further evaluation.  Upon my arrival the patient was feeling well and had no complaints of shortness of breath. She told me that over the last several months she's been experiencing the sensation of shortness of breath periodically. She has been evaluated by an ear nose and throat physician for this and she has been told that she has what sounds like esophageal dysmotility. The ear nose and throat physician apparently believes that severe reflux and esophageal dysmotility are related to her choking sensation. She describes these spells as a sensation of choking and inability to move any air. It is usually associated with swallowing food. Typically this resolves within seconds to minutes and then she feels hoarse for minutes to hours afterwards.  Lines / Drains: none  Cultures / Sepsis markers: none  Antibiotics: 9/9 Vanc (surg prophylaxis?)>>   Tests / Events: 9/9 Lumbar Laminectomy >>  02/09/2012 CT chest>> no evidence of pulmonary bolus him, there is extensive atelectasis in the bases bilaterally with trace pleural effusions     Past Medical History    Diagnosis Date  . Hypertension   . Hypothyroidism   . Anxiety   . Asthma   . Shortness of breath     /w walking long distance   . Dysrhythmia   . GERD (gastroesophageal reflux disease)   . Cancer     basal cell CA  . Arthritis     lumbar DDD  . Normal cardiac stress test   . Swallowing difficulty     being seen by ENT- had endoscopy- told that its probably related to past neck surgeries    Past Surgical History  Procedure Date  . Total thyroidectomy     1990's  . Appendectomy   . Tonsillectomy   . Cardiac catheterization     "long time ago"   . Tubal ligation   . Cervical fusion   . Eye surgery     L eye- cataract removed  - /w IOL    Prior to Admission medications   Medication Sig Start Date End Date Taking? Authorizing Provider  albuterol (PROVENTIL HFA;VENTOLIN HFA) 108 (90 BASE) MCG/ACT inhaler Inhale 2 puffs into the lungs every 6 (six) hours as needed. For shortness of breath   Yes Historical Provider, MD  cyclobenzaprine (FLEXERIL) 10 MG tablet Take 1 tablet by mouth Three times daily as needed. For pain 10/30/11  Yes Historical Provider, MD  digoxin (LANOXIN) 0.125 MG tablet Take 0.125 mg by mouth daily after breakfast.    Yes Historical Provider, MD  Fluticasone-Salmeterol (ADVAIR) 500-50 MCG/DOSE AEPB Inhale 1 puff into the lungs 2 (two) times daily.    Yes Historical Provider, MD  levothyroxine (SYNTHROID, LEVOTHROID) 125 MCG  tablet Take 125 mcg by mouth daily before breakfast.    Yes Historical Provider, MD  lovastatin (MEVACOR) 20 MG tablet Take 20 mg by mouth at bedtime.   Yes Historical Provider, MD  metoprolol succinate (TOPROL-XL) 25 MG 24 hr tablet Take 12.5 mg by mouth daily after breakfast.    Yes Historical Provider, MD  omeprazole (PRILOSEC) 20 MG capsule Take 20 mg by mouth daily with breakfast.   Yes Historical Provider, MD  traMADol (ULTRAM) 50 MG tablet Take 50 mg by mouth every 6 (six) hours as needed. For pain   Yes Historical Provider, MD   verapamil (CALAN-SR) 240 MG CR tablet Take 240 mg by mouth daily with breakfast.    Yes Historical Provider, MD  oxazepam (SERAX) 10 MG capsule Take 10 mg by mouth at bedtime as needed.    Historical Provider, MD   Allergies  Allergen Reactions  . Macrodantin (Nitrofurantoin)     Chest pain  . Penicillins Rash  . Tape Rash   History reviewed. No pertinent family history. Social History  reports that she has never smoked. She does not have any smokeless tobacco history on file. She reports that she does not drink alcohol or use illicit drugs.  Review Of Systems   Gen: Denies fever, chills, weight change, fatigue, night sweats HEENT: Denies blurred vision, double vision, hearing loss, tinnitus, sinus congestion, rhinorrhea, sore throat, neck stiffness, dysphagia PULM: per HPI CV: Currently denies chest pain, edema, orthopnea, paroxysmal nocturnal dyspnea, palpitations GI: Denies abdominal pain, nausea, vomiting, diarrhea, hematochezia, melena, constipation, change in bowel habits GU: Denies dysuria, hematuria, polyuria, oliguria, urethral discharge Endocrine: Denies hot or cold intolerance, polyuria, polyphagia or appetite change Derm: Denies rash, dry skin, scaling or peeling skin change Heme: Denies easy bruising, bleeding, bleeding gums Neuro: Denies headache, numbness, weakness, slurred speech, loss of memory or consciousness  Vital Signs:   Filed Vitals:   02/09/12 2130 02/09/12 2145 02/09/12 2200 02/09/12 2227  BP: 123/50 121/39 129/62   Pulse: 94 93 92 95  Temp:      TempSrc:      Resp: 22 14 23 21   Height:      Weight:      SpO2: 97% 95% 94% 97%    Physical Examination: Gen: comfortable in bed, no acute distress HEENT: NCAT, PERRL, EOMi, OP clear,  Neck: supple without masses PULM: Few insp crackles L base CV: RRR, no mgr, no JVD AB: BS+, soft, nontender, no hsm Ext: warm, trace edema, no clubbing, no cyanosis Derm: no rash or skin breakdown Neuro: A&Ox4,  maew Psyche: Normal mood and affect  Labs and Imaging:   CBC    Component Value Date/Time   WBC 9.2 02/08/2012 0455   RBC 4.13 02/08/2012 0455   HGB 11.7* 02/08/2012 0455   HCT 36.9 02/08/2012 0455   PLT 143* 02/08/2012 0455   MCV 89.3 02/08/2012 0455   MCH 28.3 02/08/2012 0455   MCHC 31.7 02/08/2012 0455   RDW 14.6 02/08/2012 0455   LYMPHSABS 3.2 02/02/2012 1408   MONOABS 0.9 02/02/2012 1408   EOSABS 0.2 02/02/2012 1408   BASOSABS 0.0 02/02/2012 1408    BMET    Component Value Date/Time   NA 139 02/08/2012 0455   K 4.5 02/08/2012 0455   CL 105 02/08/2012 0455   CO2 24 02/08/2012 0455   GLUCOSE 125* 02/08/2012 0455   BUN 5* 02/08/2012 0455   CREATININE 0.50 02/08/2012 0455   CALCIUM 8.8 02/08/2012 0455   GFRNONAA >  90 02/08/2012 0455   GFRAA >90 02/08/2012 0455    ABG No results found for this basename: phart, pco2, pco2art, po2, po2art, hco3, tco2, acidbasedef, o2sat     Assessment and Plan:  Active Problems:  Shortness of breath  Spinal stenosis  Hypoxemia  H/O laminectomy  This is a 71 year old female who is now postop day 2 from a laminectomy he developed the sudden onset of shortness of breath in the setting of mild hypoxemia. Her symptoms have now resolved. Apparently she had nonspecific EKG changes which had been reviewed by cardiology and felt to be not consistent with ischemic heart disease.  Most likely explanation for her current hypoxemia is the atelectasis noted on her CT scan which is invariably due to the fact that she has not been out of bed much since surgery. 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 It is also likely that she has some component of aspiration and esophageal dysmotility. Currently she shows no signs of respiratory failure, she is not wheezing and she does not appear volume overloaded on physical exam.  1) Acute Shortness of  Breath: -obtain records from Dr. Delsa Bern office in [place] (apparently working her up for aspiration, intermittent choking sensation) -obtain records of recent barium swallow from [facility]  2) Atelectasis/hypoxemia -incentive spirometry -Out of bed more (she has been very sedentary since surgery), OK by NSGY to mobilize as much as possible  3) EKG changes? -f/u proBNP and echo per cardiology   Heber Washburn, M.D. Pulmonary and Critical Care Medicine [facility] Pager: [phone]  02/09/2012, 10:38 PM

## 2012-02-10 DIAGNOSIS — Z9889 Other specified postprocedural states: Secondary | ICD-10-CM

## 2012-02-10 DIAGNOSIS — M4800 Spinal stenosis, site unspecified: Secondary | ICD-10-CM | POA: Diagnosis present

## 2012-02-10 DIAGNOSIS — R0902 Hypoxemia: Secondary | ICD-10-CM | POA: Diagnosis present

## 2012-02-10 DIAGNOSIS — J383 Other diseases of vocal cords: Secondary | ICD-10-CM

## 2012-02-10 DIAGNOSIS — I519 Heart disease, unspecified: Secondary | ICD-10-CM

## 2012-02-10 DIAGNOSIS — K219 Gastro-esophageal reflux disease without esophagitis: Secondary | ICD-10-CM

## 2012-02-10 LAB — BASIC METABOLIC PANEL
Calcium: 8.6 mg/dL (ref 8.4–10.5)
GFR calc Af Amer: >90 mL/min (ref >90)
GFR calc non Af Amer: >90 mL/min (ref >90)
Potassium: 4.4 mEq/L (ref 3.5–5.1)
Sodium: 140 mEq/L (ref 135–145)

## 2012-02-10 LAB — CBC WITH DIFFERENTIAL/PLATELET
Basophils Absolute: 0 10*3/uL (ref 0.0–0.1)
Basophils Relative: 0 % (ref 0–1)
Eosinophils Absolute: 0.2 10*3/uL (ref 0.0–0.7)
Eosinophils Relative: 3 % (ref 0–5)
MCH: 28.2 pg (ref 26.0–34.0)
MCV: 88.4 fL (ref 78.0–100.0)
Neutrophils Relative %: 65 % (ref 43–77)
Platelets: 155 10*3/uL (ref 150–400)
RDW: 14.2 % (ref 11.5–15.5)

## 2012-02-10 LAB — TROPONIN I
Troponin I: <0.3 ng/mL (ref <0.30)
Troponin I: <0.3 ng/mL (ref <0.30)

## 2012-02-10 MED ORDER — PANTOPRAZOLE SODIUM 40 MG PO TBEC
40.0000 mg | DELAYED_RELEASE_TABLET | Freq: Two times a day (BID) | ORAL | Status: DC
  Administered 2012-02-10 – 2012-02-25 (×30): 40 mg via ORAL
  Filled 2012-02-10 (×25): qty 1

## 2012-02-10 NOTE — Progress Notes (Signed)
    Subjective:  The patient feels a lot better this morning. She denies chest pain or shortness of breath  Objective:  Vital Signs in the last 24 hours: Temp:  [97.5 F (36.4 C)-99.3 F (37.4 C)] 98 F (36.7 C) (09/12 0700) Pulse Rate:  [87-120] 87  (09/12 0700) Resp:  [14-28] 18  (09/12 0700) BP: (114-146)/(39-95) 123/53 mmHg (09/12 0700) SpO2:  [20 %-99 %] 98 % (09/12 0855) FiO2 (%):  [40 %-47 %] 40 % (09/11 1210) Weight:  [89.8 kg (197 lb 15.6 oz)] 89.8 kg (197 lb 15.6 oz) (09/12 0500)  Intake/Output from previous day: 09/11 0701 - 09/12 0700 In: 3001.5 [P.O.:840; I.V.:2161.5] Out: 2890 [Urine:2890]  Physical Exam: Pt is alert and oriented, NAD HEENT: normal Neck: JVP - normal, carotids 2+= without bruits Lungs: CTA bilaterally CV: RRR without murmur or gallop Abd: soft, NT, Positive BS, no hepatomegaly Ext: no C/C/E, distal pulses intact and equal Skin: warm/dry no rash   Lab Results:  Basename 02/08/12 0455  WBC 9.2  HGB 11.7*  PLT 143*    Basename 02/08/12 0455  NA 139  K 4.5  CL 105  CO2 24  GLUCOSE 125*  BUN 5*  CREATININE 0.50    Basename 02/10/12 0235 02/09/12 2148  TROPONINI <0.30 <0.30    Cardiac Studies: 2-D echo pending  Tele: Sinus rhythm without arrhythmia  Assessment/Plan:  1. Shortness of breath 2. Atypical chest pain with negative cardiac enzymes 3. Atelectasis  The patient feels stable from a cardiovascular perspective. Her echo is pending, but I would not anticipate any further evaluation. 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 Her heart rhythm has been stable.   Tonny Bollman, M.D. 02/10/2012, 9:18 AM

## 2012-02-10 NOTE — Progress Notes (Signed)
Name: Kim Mcgee MRN: [id number] DOB: 31-Oct-1940  LOS: 3  CRITICAL CARE FOLLOWUP NOTE  History of Present Illness: This is a 71 year old female with a past medical history significant for GERD who was admitted on 02/07/2012 for an elective decompressive lumbar laminectomy. She tolerated the procedure well without major complication but on 02/09/2012 in the late afternoon she developed the acute onset of shortness of breath. She states that she was sitting in a chair talking to her family and then tried to swallow some saliva but then immediately felt the sensation that she could not breathe. She felt that she was choking and air could not move. This resolved and was associated with some chest discomfort. Cardiology was consulted due to possible EKG changes but they felt these were nonspecific. Pulmonary critical care was consulted for further evaluation.  Subjective: No further episodes of dyspnea or UA sx, no CP  Lines / Drains: none  Cultures / Sepsis markers: none  Antibiotics: 9/9 Vanc (surg prophylaxis?)>>   Tests / Events: 9/9 Lumbar Laminectomy >>  02/09/2012 CT chest>> no evidence of pulmonary bolus him, there is extensive atelectasis in the bases bilaterally with trace pleural effusions     Vital Signs:   Filed Vitals:   02/10/12 0407 02/10/12 0500 02/10/12 0600 02/10/12 0700  BP: 129/45 124/50 121/53 123/53  Pulse: 92 91 91 87  Temp:    98 F (36.7 C)  TempSrc:    Oral  Resp: 21 20 21 18   Height:  5\' 2"  (1.575 m)    Weight:  89.8 kg (197 lb 15.6 oz)    SpO2: 95% 96% 94% 97%    Physical Examination: Gen: comfortable in bed, no acute distress HEENT: NCAT, PERRL, EOMi, OP clear,  Neck: supple without masses PULM: Few insp crackles L base CV: RRR, no mgr, no JVD AB: BS+, soft, nontender, no hsm Ext: warm, trace edema, no clubbing, no cyanosis Derm: no rash or skin breakdown Neuro: A&Ox4, maew Psyche: Normal mood and affect  Labs and Imaging:   CBC      Component Value Date/Time   WBC 9.2 02/08/2012 0455   RBC 4.13 02/08/2012 0455   HGB 11.7* 02/08/2012 0455   HCT 36.9 02/08/2012 0455   PLT 143* 02/08/2012 0455   MCV 89.3 02/08/2012 0455   MCH 28.3 02/08/2012 0455   MCHC 31.7 02/08/2012 0455   RDW 14.6 02/08/2012 0455   LYMPHSABS 3.2 02/02/2012 1408   MONOABS 0.9 02/02/2012 1408   EOSABS 0.2 02/02/2012 1408   BASOSABS 0.0 02/02/2012 1408    BMET    Component Value Date/Time   NA 139 02/08/2012 0455   K 4.5 02/08/2012 0455   CL 105 02/08/2012 0455   CO2 24 02/08/2012 0455   GLUCOSE 125* 02/08/2012 0455   BUN 5* 02/08/2012 0455   CREATININE 0.50 02/08/2012 0455   CALCIUM 8.8 02/08/2012 0455   GFRNONAA >90 02/08/2012 0455   GFRAA >90 02/08/2012 0455    ABG No results found for this basename: phart,  pco2,  pco2art,  po2,  po2art,  hco3,  tco2,  acidbasedef,  o2sat     Assessment and Plan:  Active Problems:  Shortness of breath  Spinal stenosis  Hypoxemia  H/O laminectomy  This is a 71 year old female who is now postop day 2 from a laminectomy he developed the sudden onset of shortness of breath in the setting of mild hypoxemia. Her symptoms have now resolved. Apparently she had nonspecific EKG changes which had been reviewed  by cardiology and felt to be not consistent with ischemic heart disease.  Most likely explanation for her current hypoxemia is the atelectasis noted on her CT scan which is invariably due to the fact that she has not been out of bed much since surgery. 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 It is also likely that she has some component of aspiration and esophageal dysmotility. Currently she shows no signs of respiratory failure, she is not wheezing and she does not appear volume overloaded on physical exam.  [place] Note 02/10/12 - I have reviewed the data above, the sx with the patient. Her  episodes are very consistent with UA irritation and VD dysfxn, apparently being triggered by GERD/aspiration. This is a chronic problem, do not believe this is a post-op complication or that it should hinder her recovery from sgy. Workup is ongoing as an outpt.   1) Acute Shortness of Breath, probable VCD -obtain records from Dr. Delsa Bern office in [place] (apparently working her up for aspiration, intermittent choking sensation) -obtain records of recent barium swallow from [facility] -consider GI consult, but only if this becomes a barrier to her progression to discharge (it is already being worked-up outpt) -will double her protonix during the hospitalization; she is on omeprazole 20mg  daily at home -stop Advair. No evidence that she has asthma, no hx PFT. It is almost certainly irritating her upper airway. Would NOT send her home on this medication  2) Atelectasis/hypoxemia -incentive spirometry -Out of bed more (she has been very sedentary since surgery), OK by NSGY to mobilize as much as possible  3) EKG changes?, BNP slightly elevated, echocardiogram pending  4) Dispo - I believe Ms Saliba can transfer out of ICU. Please call us if we can help you further.   Levy Pupa, MD, PhD 02/10/2012, 9:16 AM [facility] [phone] or if no answer [phone]

## 2012-02-10 NOTE — Progress Notes (Signed)
Occupational Therapy Treatment Patient Details Name: Kim Mcgee MRN: [id number] DOB: 10-31-1940 Today's Date: 02/10/2012 Time: 4098-1191 OT Time Calculation (min): 39 min  OT Assessment / Plan / Recommendation Comments on Treatment Session Pt more participatory today.  Still requires cues for back precautions.   Pain 7/10    Follow Up Recommendations  Home health OT;Supervision/Assistance - 24 hour    Barriers to Discharge       Equipment Recommendations  Rolling walker with 5" wheels;3 in 1 bedside comode    Recommendations for Other Services    Frequency Min 2X/week   Plan Discharge plan remains appropriate    Precautions / Restrictions Precautions Precautions: Back Precaution Booklet Issued: Yes (comment) Precaution Comments: verbalized 2/3 precautions.  Requires max cues to recall twisting Required Braces or Orthoses: Spinal Brace Spinal Brace: Applied in sitting position Restrictions Weight Bearing Restrictions: No       ADL  Grooming: Performed;Minimal assistance;Wash/dry hands;Brushing hair;Teeth care Where Assessed - Grooming: Supported standing Equipment Used: Back brace;Rolling walker Transfers/Ambulation Related to ADLs: Ambulates in room with RW ADL Comments: Pt. requires increased time to complete tasks.  Copious drainage leaking from dressing.  Nurse notified and dressing changed    OT Diagnosis:    OT Problem List:   OT Treatment Interventions:     OT Goals ADL Goals ADL Goal: Grooming - Progress: Progressing toward goals ADL Goal: Additional Goal #1 - Progress: Progressing toward goals  Visit Information  Last OT Received On: 02/10/12 Assistance Needed: +1    Subjective Data      Prior Functioning       Cognition  Overall Cognitive Status: Appears within functional limits for tasks assessed/performed Arousal/Alertness: Awake/alert Orientation Level: Appears intact for tasks assessed Behavior During Session: [facility] for tasks performed      Mobility  Shoulder Instructions Bed Mobility Bed Mobility: Rolling Right;Right Sidelying to Sit;Sitting - Scoot to [place] of Bed;Sit to Sidelying Right Rolling Right: 4: Min assist;With rail Right Sidelying to Sit: 4: Min assist;With rails;HOB flat Sitting - Scoot to [place] of Bed: 4: Min assist;With rail Sit to Sidelying Right: 3: Mod assist Details for Bed Mobility Assistance: Pt. requires verbal cues for technique and assist to life LEs onto bed Transfers Transfers: Sit to Stand;Stand to Sit Sit to Stand: With upper extremity assist;From bed;4: Min assist Stand to Sit: 4: Min assist;With upper extremity assist;To bed Details for Transfer Assistance: cues for hand placement and to avoid twisting       Exercises      Balance Balance Balance Assessed: No Static Sitting Balance Static Sitting - Balance Support: Feet supported;No upper extremity supported Static Sitting - Level of Assistance: 5: Stand by assistance   End of Session OT - End of Session Equipment Utilized During Treatment: Back brace Activity Tolerance: Patient limited by pain;Patient limited by fatigue Patient left: in bed;with call bell/phone within reach;with nursing in room;with family/visitor present Nurse Communication: Other (comment) (drainage from dressing)  GO     Summar Mcglothlin, Ursula Alert M 02/10/2012, 5:04 PM

## 2012-02-10 NOTE — Progress Notes (Signed)
Physical Therapy Treatment Patient Details Name: Kim Mcgee MRN: [id number] DOB: 30-Sep-1940 Today's Date: 02/10/2012 Time: 1400-1440 PT Time Calculation (min): 40 min  PT Assessment / Plan / Recommendation Comments on Treatment Session  Mobility continues to improve, with little assistance needed by pt.  Sats maintained >91% on RA.  All education revisited and pt verbalizes understancing    Follow Up Recommendations  Home health PT;Supervision for mobility/OOB    Barriers to Discharge        Equipment Recommendations  Rolling walker with 5" wheels;3 in 1 bedside comode    Recommendations for Other Services    Frequency Min 5X/week   Plan Discharge plan remains appropriate;Frequency remains appropriate    Precautions / Restrictions Precautions Precautions: Back Precaution Comments: verbalized 2/3 precautions, having trouble with arcing, but I think she has the concept Required Braces or Orthoses: Spinal Brace Spinal Brace: Applied in sitting position Restrictions Weight Bearing Restrictions: No   Pertinent Vitals/Pain     Mobility  Bed Mobility Bed Mobility: Rolling Right;Right Sidelying to Sit;Sitting - Scoot to Edge of Bed Rolling Right: 4: Min assist;With rail Right Sidelying to Sit: 4: Min assist;With rails;HOB flat Sitting - Scoot to Edge of Bed: 4: Min guard;With rail Details for Bed Mobility Assistance: Pt. requires verbal cueing for technique and sequencing Transfers Transfers: Sit to Stand;Stand to Sit Sit to Stand: 4: Min guard;With upper extremity assist;From bed Stand to Sit: 4: Min guard;With upper extremity assist;To bed Details for Transfer Assistance: reinforced safety Ambulation/Gait Ambulation/Gait Assistance: 4: Min guard Ambulation Distance (Feet): 110 Feet Assistive device: Rolling walker Ambulation/Gait Assistance Details: generally steady; vc's for postural checks; assist to help maneuver the RW only Gait Pattern: Step-through  pattern;Decreased stride length;Decreased trunk rotation;Trunk flexed    Exercises     PT Diagnosis:    PT Problem List:   PT Treatment Interventions:     PT Goals Acute Rehab PT Goals Time For Goal Achievement: 02/15/12 Potential to Achieve Goals: Good PT Goal: Rolling Supine to Right Side - Progress: Progressing toward goal PT Goal: Rolling Supine to Left Side - Progress: Progressing toward goal PT Goal: Supine/Side to Sit - Progress: Progressing toward goal PT Goal: Sit to Stand - Progress: Progressing toward goal PT Goal: Stand to Sit - Progress: Progressing toward goal PT Goal: Ambulate - Progress: Progressing toward goal Additional Goals Additional Goal #1: Pt will recall 3/3 back precautions and demonstrate during all functional mobility. PT Goal: Additional Goal #1 - Progress: Progressing toward goal  Visit Information  Last PT Received On: 02/10/12 Assistance Needed: +1    Subjective Data  Subjective: yeah, I've gotten up several times today   Cognition  Overall Cognitive Status: Appears within functional limits for tasks assessed/performed Arousal/Alertness: Awake/alert Orientation Level: Appears intact for tasks assessed Behavior During Session: [facility] for tasks performed    Balance  Balance Balance Assessed: No Static Sitting Balance Static Sitting - Balance Support: Feet supported;No upper extremity supported Static Sitting - Level of Assistance: 5: Stand by assistance  End of Session PT - End of Session Equipment Utilized During Treatment: Back brace Activity Tolerance: Patient tolerated treatment well Patient left: in chair;with call bell/phone within reach;with family/visitor present Nurse Communication: Mobility status   GP     Kim Mcgee, Eliseo Gum 02/10/2012, 4:00 PM

## 2012-02-10 NOTE — Progress Notes (Signed)
  Echocardiogram 2D Echocardiogram has been performed.  Kim Mcgee 02/10/2012, 11:15 AM

## 2012-02-10 NOTE — Progress Notes (Signed)
Subjective: Patient reports less back pain, legs weak but improved from pre-op -  No SOB now Events of last night noted  Objective: Vital signs in last 24 hours: Temp:  [97.5 F (36.4 C)-99.3 F (37.4 C)] 98 F (36.7 C) (09/12 0700) Pulse Rate:  [87-120] 91  (09/12 0900) Resp:  [14-28] 20  (09/12 0900) BP: (101-146)/(39-95) 115/46 mmHg (09/12 0900) SpO2:  [20 %-99 %] 99 % (09/12 0900) FiO2 (%):  [40 %-47 %] 40 % (09/11 1210) Weight:  [89.8 kg (197 lb 15.6 oz)] 89.8 kg (197 lb 15.6 oz) (09/12 0500)  Intake/Output from previous day: 09/11 0701 - 09/12 0700 In: 3001.5 [P.O.:840; I.V.:2161.5] Out: 2890 [Urine:2890] Intake/Output this shift:    Wound: - c/d/i Moving LE's well   Lab Results:  [facility] 02/08/12 0455  WBC 9.2  HGB 11.7*  HCT 36.9  PLT 143*   BMET  Basename 02/08/12 0455  NA 139  K 4.5  CL 105  CO2 24  GLUCOSE 125*  BUN 5*  CREATININE 0.50  CALCIUM 8.8    Studies/Results: Ct Angio Chest Pe W/cm &/or Wo Cm  02/09/2012  *RADIOLOGY REPORT*  Clinical Data: Chest pain, shortness of breath.  CT ANGIOGRAPHY CHEST  Technique:  Multidetector CT imaging of the chest using the standard protocol during bolus administration of intravenous contrast. Multiplanar reconstructed images including MIPs were obtained and reviewed to evaluate the vascular anatomy.  Contrast: OMNIPAQUE IOHEXOL 350 MG/ML SOLN  Comparison: None.  Findings: Dependent and bibasilar atelectasis noted within the lungs.  Trace pleural effusions.  Heart is borderline in size. Aorta is normal caliber. No mediastinal, hilar, or axillary adenopathy.  Visualized thyroid and chest wall soft tissues unremarkable.  No filling defects in the pulmonary arteries to suggest pulmonary emboli.  Imaging into the upper abdomen shows no acute findings.  Small low- density area within the dome of the liver, likely small cyst.  No acute bony abnormality.  Degenerative changes in the thoracic spine.  IMPRESSION: No  evidence of pulmonary embolus.  Extensive dependent and bibasilar atelectasis.  Trace bilateral effusions.   Original Report Authenticated By: Cyndie Chime, M.D.    Dg Chest Port 1 View  02/09/2012  *RADIOLOGY REPORT*  Clinical Data: Difficulty breathing.  PORTABLE CHEST - 1 VIEW  Comparison: 02/02/2012  Findings: Heart is borderline in size.  Low lung volumes.  Minimal bibasilar atelectasis.  No effusions.  No acute bony abnormality.  IMPRESSION: Low lung volumes with bibasilar atelectasis.   Original Report Authenticated By: Cyndie Chime, M.D.     Assessment/Plan: CT chest neg ,  Cardiac enzymes neg,  Will transfer to floor , increase activity  LOS: 3 days     Sinahi Knights R, MD 02/10/2012, 9:24 AM

## 2012-02-10 NOTE — Progress Notes (Signed)
Received handoff from [place], [place] from day shift. PIV obtained by IV team, patient transported to CTA for r/o PE. with no difficulties or pain. Patient then taken to new room, 3101. Patient denies SOB or CP, VSS. Update given to ICU RN.

## 2012-02-11 DIAGNOSIS — R079 Chest pain, unspecified: Secondary | ICD-10-CM

## 2012-02-11 MED ORDER — HYDROCODONE-ACETAMINOPHEN 5-325 MG PO TABS
1.0000 | ORAL_TABLET | ORAL | Status: DC | PRN
  Administered 2012-02-11 – 2012-02-12 (×6): 2 via ORAL
  Administered 2012-02-13: 1 via ORAL
  Administered 2012-02-13 – 2012-02-14 (×2): 2 via ORAL
  Filled 2012-02-11 (×10): qty 2

## 2012-02-11 MED ORDER — ALUM & MAG HYDROXIDE-SIMETH 200-200-20 MG/5ML PO SUSP
30.0000 mL | ORAL | Status: DC | PRN
Start: 2012-02-11 — End: 2012-02-25
  Administered 2012-02-11 – 2012-02-16 (×5): 30 mL via ORAL
  Filled 2012-02-11 (×5): qty 30

## 2012-02-11 MED ORDER — OXYCODONE-ACETAMINOPHEN 5-325 MG PO TABS
1.0000 | ORAL_TABLET | ORAL | Status: DC | PRN
  Administered 2012-02-12 – 2012-02-13 (×3): 1 via ORAL
  Administered 2012-02-13 – 2012-02-19 (×24): 2 via ORAL
  Administered 2012-02-20: 1 via ORAL
  Administered 2012-02-20 (×2): 2 via ORAL
  Administered 2012-02-20 – 2012-02-21 (×2): 1 via ORAL
  Administered 2012-02-21: 2 via ORAL
  Administered 2012-02-21 – 2012-02-22 (×2): 1 via ORAL
  Administered 2012-02-22 – 2012-02-25 (×10): 2 via ORAL
  Filled 2012-02-11 (×2): qty 2
  Filled 2012-02-11: qty 1
  Filled 2012-02-11 (×6): qty 2
  Filled 2012-02-11: qty 1
  Filled 2012-02-11 (×11): qty 2
  Filled 2012-02-11: qty 1
  Filled 2012-02-11 (×6): qty 2
  Filled 2012-02-11 (×2): qty 1
  Filled 2012-02-11: qty 2
  Filled 2012-02-11: qty 1
  Filled 2012-02-11 (×4): qty 2
  Filled 2012-02-11: qty 1
  Filled 2012-02-11 (×9): qty 2
  Filled 2012-02-11: qty 1

## 2012-02-11 MED ORDER — FAMOTIDINE 20 MG PO TABS
20.0000 mg | ORAL_TABLET | Freq: Two times a day (BID) | ORAL | Status: DC | PRN
  Administered 2012-02-11 – 2012-02-12 (×3): 20 mg via ORAL
  Filled 2012-02-11 (×3): qty 1

## 2012-02-11 MED ORDER — MORPHINE SULFATE 2 MG/ML IJ SOLN
2.0000 mg | INTRAMUSCULAR | Status: DC | PRN
  Administered 2012-02-11 – 2012-02-15 (×4): 2 mg via INTRAVENOUS
  Filled 2012-02-11 (×4): qty 1

## 2012-02-11 NOTE — Progress Notes (Signed)
Assisted patient to use restroom several times throughout the day.l Patient unable to void this afternioon.  Bladder scan preformed, 658cc scanned.  700cc obtained from in and out cath.  Will continue to monitor.  Osvaldo Angst, RN-------

## 2012-02-11 NOTE — Progress Notes (Signed)
Physical Therapy Treatment Patient Details Name: Kim Mcgee MRN: [id number] DOB: 1940-06-01 Today's Date: 02/11/2012 Time: 4401-0272 PT Time Calculation (min): 24 min  PT Assessment / Plan / Recommendation Comments on Treatment Session  Patient progressing well. Her husband was present throghout and very helpful. Continue with current POC    Follow Up Recommendations  Home health PT;Supervision for mobility/OOB    Barriers to Discharge        Equipment Recommendations  Rolling walker with 5" wheels;3 in 1 bedside comode    Recommendations for Other Services    Frequency Min 5X/week   Plan Discharge plan remains appropriate;Frequency remains appropriate    Precautions / Restrictions Precautions Precautions: Back Required Braces or Orthoses: Spinal Brace Spinal Brace: Thoracolumbosacral orthotic   Pertinent Vitals/Pain     Mobility  Bed Mobility Sit to Sidelying Right: 4: Min assist;With rail;HOB flat Details for Bed Mobility Assistance: A for LEs back into bed Transfers Sit to Stand: 4: Min assist;With upper extremity assist;From chair/3-in-1 Stand to Sit: To bed;4: Min assist;With upper extremity assist Details for Transfer Assistance: Cues for safe technique and hand placement Ambulation/Gait Ambulation/Gait Assistance: 4: Min guard Ambulation Distance (Feet): 125 Feet Assistive device: Rolling walker Ambulation/Gait Assistance Details: Cues for safety with RW Gait Pattern: Step-through pattern;Decreased stride length    Exercises     PT Diagnosis:    PT Problem List:   PT Treatment Interventions:     PT Goals Acute Rehab PT Goals PT Goal: Supine/Side to Sit - Progress: Progressing toward goal PT Goal: Sit to Stand - Progress: Progressing toward goal PT Goal: Stand to Sit - Progress: Progressing toward goal PT Goal: Ambulate - Progress: Progressing toward goal Additional Goals PT Goal: Additional Goal #1 - Progress: Progressing toward goal  Visit  Information  Last PT Received On: 02/11/12 Assistance Needed: +1    Subjective Data      Cognition  Overall Cognitive Status: Appears within functional limits for tasks assessed/performed Arousal/Alertness: Awake/alert Orientation Level: Appears intact for tasks assessed Behavior During Session: [facility] for tasks performed    Balance     End of Session PT - End of Session Equipment Utilized During Treatment: Gait belt;Back brace Activity Tolerance: Patient tolerated treatment well Patient left: in bed;with call bell/phone within reach Nurse Communication: Mobility status   GP     Fredrich Birks 02/11/2012, 1:49 PM 02/11/2012 Fredrich Birks PTA [phone] pager [phone] office

## 2012-02-11 NOTE — Progress Notes (Signed)
Doing well. C/o appropriate incisional soreness. Less leg pain No Numbness, tingling, weakness No Nausea /vomiting , + flatus, small BM Amb more  Temp:  [97.4 F (36.3 C)-98.6 F (37 C)] 98.6 F (37 C) (09/13 0556) Pulse Rate:  [86-106] 102  (09/13 0556) Resp:  [16-25] 20  (09/13 0556) BP: (105-149)/(44-109) 141/58 mmHg (09/13 0556) SpO2:  [86 %-99 %] 95 % (09/13 0556) Weight:  [89.1 kg (196 lb 6.9 oz)] 89.1 kg (196 lb 6.9 oz) (09/12 2138) Good strength and sensation Incision CDI  Plan: Increase activity -   D/c pca , d/c foley

## 2012-02-11 NOTE — Care Management Note (Signed)
    Page 1 of 2   02/25/2012     11:15:58 AM   CARE MANAGEMENT NOTE 02/25/2012  Patient:  Kim Mcgee, Kim Mcgee   Account Number:  [id number]  Date Initiated:  02/11/2012  Documentation initiated by:  Onnie Boer  Subjective/Objective Assessment:   PT WAS ADMITTED FOR SURGERY     Action/Plan:   PROGRESSION OF CARE AND DISCHARGE PLANNING   Anticipated DC Date:  02/13/2012   Anticipated DC Plan:  HOME W HOME HEALTH SERVICES      DC Planning Services  CM consult      Choice offered to / List presented to:             Status of service:  Completed, signed off Medicare Important Message given?   (If response is "NO", the following Medicare IM given date fields will be blank) Date Medicare IM given:   Date Additional Medicare IM given:    Discharge Disposition:  SKILLED NURSING FACILITY  Per UR Regulation:  Reviewed for med. necessity/level of care/duration of stay  If discussed at Long Length of Stay Meetings, dates discussed:   02/15/2012  02/17/2012  02/24/2012    Comments:  02/25/12 Onnie Boer, RN, BSN 1114 PT WILL BE DC'D TO [facility]  02/24/12 Onnie Boer, RN, BSN [phone] PT AWAITING BED AT [facility] TO DC TOMORROW  02/17/12 Onnie Boer, RN, BSN 1559 PT NEEDS TO URINATE AND WILL BE DC'D TO HOME WITH HH WHEN SHE DOES, WILL F/U.  02/14/12 Onnie Boer, RN, BSN 1647 PT NEEDS INCREASED ACTIVITY WITH PT, SLOW TO PROGRESS. PLAN IS HM WITH [facility] AND DME'S  02/11/12 Onnie Boer, RN, BSN 1651 I HAVE CONTACTED [facility] IN [place], [place] AND THEY WILL BE ABLE TO SERVICE THE PATIENT.  NO STAFF IN THE OFFICE TO ACCEPT HER AT THIS TIME.  I WILL FAX EVERYTHING OVER ON MONDAY TO ARRANGE HH PT/OT.  PT WILL BE ABLE TO GET DME'S FROM [facility].  NOT ORDERS FOR ANY HH OR DME.  WILL F/U.   02/11/12 Onnie Boer, RN, BSN 1415 PT WAS ADMITTED FOR SUGERY, PT HAS BEEN SLOW TO PROGRESS. PLAN IS FOR PT TO RETURN TO HOME WITH HH AND DME'S.  WILL F/U.  PT IS TO HAVE PCA  AND FC DC'D TODAY.

## 2012-02-11 NOTE — Progress Notes (Signed)
SUBJECTIVE: Mild burning before she ate yesterday. No pain this am.   BP 141/58  Pulse 102  Temp 98.6 F (37 C) (Oral)  Resp 20  Ht 5\' 2"  (1.575 m)  Wt 196 lb 6.9 oz (89.1 kg)  BMI 35.93 kg/m2  SpO2 95%  Intake/Output Summary (Last 24 hours) at 02/11/12 0740 Last data filed at 02/10/12 1900  Gross per 24 hour  Intake   1080 ml  Output   1250 ml  Net   -170 ml    PHYSICAL EXAM General: Well developed, well nourished, in no acute distress. Alert and oriented x 3.  Psych:  Good affect, responds appropriately Neck: No JVD. No masses noted.  Lungs: Clear bilaterally with no wheezes or rhonci noted.  Heart: RRR with no murmurs noted. Abdomen: Bowel sounds are present. Soft, non-tender.  Extremities: No lower extremity edema.   LABS: Basic Metabolic Panel:  Basename 02/10/12 0910  NA 140  K 4.4  CL 103  CO2 32  GLUCOSE 131*  BUN 4*  CREATININE 0.50  CALCIUM 8.6  MG --  PHOS --   CBC:  Basename 02/10/12 0910  WBC 7.6  NEUTROABS 5.0  HGB 10.0*  HCT 31.3*  MCV 88.4  PLT 155   Cardiac Enzymes:  Basename 02/10/12 0910 02/10/12 0235 02/09/12 2148  CKTOTAL -- -- 411*  CKMB -- -- 2.3  CKMBINDEX -- -- --  TROPONINI <0.30 <0.30 <0.30    Current Meds:    . digoxin  0.125 mg Oral QPC breakfast  . docusate sodium  100 mg Oral BID  . levothyroxine  125 mcg Oral QAC breakfast  . metoprolol succinate  12.5 mg Oral QPC breakfast  . morphine   Intravenous Q4H  . pantoprazole  40 mg Oral BID AC  . simvastatin  10 mg Oral q1800  . verapamil  240 mg Oral Q breakfast  . DISCONTD: Fluticasone-Salmeterol  1 puff Inhalation BID  . DISCONTD: pantoprazole  40 mg Oral Q1200   Echo 02/10/12:  Left ventricle: The cavity size was normal. Wall thickness was normal. Systolic function was normal. The estimated ejection fraction was in the range of 60% to 65%. Although no diagnostic regional wall motion abnormality was identified, this possibility cannot be completely  excluded on the basis of this study. Doppler parameters are consistent with abnormal left ventricular relaxation (grade 1 diastolic dysfunction). - Aortic valve: There was no stenosis. - Mitral valve: No significant regurgitation. - Left atrium: The atrium was mildly dilated. - Right ventricle: The cavity size was normal. Systolic function was normal. - Right atrium: The atrium was mildly dilated. - Pulmonary arteries: PA peak pressure: 38mm Hg (S). - Inferior vena cava: The vessel was normal in size; the respirophasic diameter changes were in the normal range (= 50%); findings are consistent with normal central venous pressure. - Pericardium, extracardiac: A trivial pericardial effusion was identified. Impressions:  - Normal LV size and systolic function, EF 60-65%. Normal RV size and systolic function. Mild pulmonary hypertension. No significant valvular abnormalities.    ASSESSMENT AND PLAN: 1. Atypical chest pain with negative cardiac enzymes. No evidence of acute coronary syndrome.  3. H/O SVT  Her echo is outlined above and has no concerning findings. She has had no new issues overnight.  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 She is off of telemetry but no c/o palpitations.   We will sign off. Please call with questions.  Wonder Donaway  9/13/20137:40 AM

## 2012-02-11 NOTE — Progress Notes (Signed)
Occupational Therapy Treatment Patient Details Name: Kim Mcgee MRN: [id number] DOB: 1940-08-02 Today's Date: 02/11/2012 Time: 4098-1191 OT Time Calculation (min): 34 min  OT Assessment / Plan / Recommendation Comments on Treatment Session Pt. progressing with ADLs - requires cues for back precautions    Follow Up Recommendations  Home health OT;Supervision/Assistance - 24 hour    Barriers to Discharge       Equipment Recommendations  Rolling walker with 5" wheels;3 in 1 bedside comode    Recommendations for Other Services    Frequency Min 2X/week   Plan Discharge plan remains appropriate    Precautions / Restrictions Precautions Precautions: Back Precaution Booklet Issued: Yes (comment) Precaution Comments: Pt. requires min verbal cues for back precautions with activity Required Braces or Orthoses: Spinal Brace Spinal Brace: Thoracolumbosacral orthotic   Pertinent Vitals/Pain     ADL  Grooming: Min guard;Performed;Wash/dry hands;Wash/dry face Where Assessed - Grooming: Supported standing Lower Body Bathing: Simulated;Minimal assistance Where Assessed - Lower Body Bathing: Supported sit to stand (with AE) Lower Body Dressing: Performed;Minimal assistance (with AE and increased time) Where Assessed - Lower Body Dressing: Supported sit to Pharmacist, hospital: Performed;Min Pension scheme manager Method: Sit to Barista: Comfort height toilet Toileting - Clothing Manipulation and Hygiene: Performed;Modified independent (max A for hygiene) Where Assessed - Toileting Clothing Manipulation and Hygiene: Sit to stand from 3-in-1 or toilet Equipment Used: Back brace;Rolling walker Transfers/Ambulation Related to ADLs: min guard assist ADL Comments: Pt. requires min verbal cues to maintain back precautions.  Pt. and husband instructed in use and acquisition of AE including toileting aid.  pt. requires increased time to complete tasks.  Husband is  supportive    OT Diagnosis:    OT Problem List:   OT Treatment Interventions:     OT Goals ADL Goals ADL Goal: Grooming - Progress: Progressing toward goals ADL Goal: Lower Body Bathing - Progress: Progressing toward goals ADL Goal: Lower Body Dressing - Progress: Progressing toward goals ADL Goal: Toilet Transfer - Progress: Progressing toward goals ADL Goal: Toileting - Hygiene - Progress: Progressing toward goals ADL Goal: Additional Goal #1 - Progress: Progressing toward goals  Visit Information  Last OT Received On: 02/11/12 Assistance Needed: +1    Subjective Data      Prior Functioning       Cognition  Overall Cognitive Status: Appears within functional limits for tasks assessed/performed Arousal/Alertness: Awake/alert Orientation Level: Appears intact for tasks assessed Behavior During Session: [facility] for tasks performed    Mobility  Shoulder Instructions Transfers Transfers: Sit to Stand;Stand to Sit Sit to Stand: 4: Min guard;With upper extremity assist;From bed;From toilet Stand to Sit: 4: Min guard;To chair/3-in-1;To toilet Details for Transfer Assistance: Cues for safe technique and hand placement       Exercises      Balance     End of Session OT - End of Session Equipment Utilized During Treatment: Back brace Activity Tolerance: Patient tolerated treatment well Patient left: in chair;with call bell/phone within reach;with family/visitor present Nurse Communication: Other (comment) (status of session)  GO     Damarion Mendizabal M 02/11/2012, 6:21 PM

## 2012-02-12 LAB — URINALYSIS, ROUTINE W REFLEX MICROSCOPIC
Bilirubin Urine: NEGATIVE
Protein, ur: NEGATIVE mg/dL
Specific Gravity, Urine: 1.007 (ref 1.005–1.030)
Urobilinogen, UA: 0.2 mg/dL (ref 0.0–1.0)

## 2012-02-12 LAB — CBC
HCT: 30.1 % — ABNORMAL LOW (ref 36.0–46.0)
MCHC: 31.9 g/dL (ref 30.0–36.0)
MCV: 87.2 fL (ref 78.0–100.0)
RDW: 14 % (ref 11.5–15.5)

## 2012-02-12 LAB — BASIC METABOLIC PANEL
BUN: 6 mg/dL (ref 6–23)
CO2: 30 mEq/L (ref 19–32)
Chloride: 103 mEq/L (ref 96–112)
Creatinine, Ser: 0.58 mg/dL (ref 0.50–1.10)
Glucose, Bld: 89 mg/dL (ref 70–99)

## 2012-02-12 LAB — URINE MICROSCOPIC-ADD ON

## 2012-02-12 MED ORDER — SULFAMETHOXAZOLE-TMP DS 800-160 MG PO TABS
1.0000 | ORAL_TABLET | Freq: Two times a day (BID) | ORAL | Status: DC
  Administered 2012-02-12 – 2012-02-21 (×20): 1 via ORAL
  Filled 2012-02-12 (×23): qty 1

## 2012-02-12 MED ORDER — VANCOMYCIN HCL IN DEXTROSE 1-5 GM/200ML-% IV SOLN
1000.0000 mg | Freq: Two times a day (BID) | INTRAVENOUS | Status: DC
  Administered 2012-02-12 – 2012-02-14 (×6): 1000 mg via INTRAVENOUS
  Filled 2012-02-12 (×8): qty 200

## 2012-02-12 NOTE — Progress Notes (Signed)
I have read and agree with the below treatment note and plan. Toshiko Kemler Helen Whitlow PT, DPT Pager: [phone] 

## 2012-02-12 NOTE — Progress Notes (Signed)
ANTIBIOTIC CONSULT NOTE - INITIAL  Pharmacy Consult for Vancomycin Indication: drainage from surgical wound  Allergies  Allergen Reactions  . Macrodantin (Nitrofurantoin)     Chest pain  . Penicillins Rash  . Tape Rash    Patient Measurements: Height: 5\' 2"  (157.5 cm) Weight: 196 lb 6.9 oz (89.1 kg) IBW/kg (Calculated) : 50.1   Vital Signs: Temp: 98 F (36.7 C) (09/14 0617) Temp src: Oral (09/14 0617) BP: 150/64 mmHg (09/14 0617) Pulse Rate: 92  (09/14 0617) Intake/Output from previous day: 09/13 0701 - 09/14 0700 In: -  Out: 1750 [Urine:1750] Intake/Output from this shift:    Labs:  Basename 02/12/12 0330 02/10/12 0910  WBC 5.4 7.6  HGB 9.6* 10.0*  PLT 231 155  LABCREA -- --  CREATININE 0.58 0.50   Estimated Creatinine Clearance: 66.9 ml/min (by C-G formula based on Cr of 0.58). No results found for this basename: VANCOTROUGH:2,VANCOPEAK:2,VANCORANDOM:2,GENTTROUGH:2,GENTPEAK:2,GENTRANDOM:2,TOBRATROUGH:2,TOBRAPEAK:2,TOBRARND:2,AMIKACINPEAK:2,AMIKACINTROU:2,AMIKACIN:2, in the last 72 hours   Microbiology: Recent Results (from the past 720 hour(s))  SURGICAL PCR SCREEN     Status: Abnormal   Collection Time   02/02/12  2:05 PM      Component Value Range Status Comment   MRSA, PCR POSITIVE (*) NEGATIVE Final    Staphylococcus aureus POSITIVE (*) NEGATIVE Final     Medical History: Past Medical History  Diagnosis Date  . Hypertension   . Hypothyroidism   . Anxiety   . Asthma   . Shortness of breath     /w walking long distance   . Dysrhythmia   . GERD (gastroesophageal reflux disease)   . Cancer     basal cell CA  . Arthritis     lumbar DDD  . Normal cardiac stress test   . Swallowing difficulty     being seen by ENT- had endoscopy- told that its probably related to past neck surgeries     Medications:  Prescriptions prior to admission  Medication Sig Dispense Refill  . albuterol (PROVENTIL HFA;VENTOLIN HFA) 108 (90 BASE) MCG/ACT inhaler Inhale 2  puffs into the lungs every 6 (six) hours as needed. For shortness of breath      . cyclobenzaprine (FLEXERIL) 10 MG tablet Take 1 tablet by mouth Three times daily as needed. For pain      . digoxin (LANOXIN) 0.125 MG tablet Take 0.125 mg by mouth daily after breakfast.       . Fluticasone-Salmeterol (ADVAIR) 500-50 MCG/DOSE AEPB Inhale 1 puff into the lungs 2 (two) times daily.       Marland Kitchen levothyroxine (SYNTHROID, LEVOTHROID) 125 MCG tablet Take 125 mcg by mouth daily before breakfast.       . lovastatin (MEVACOR) 20 MG tablet Take 20 mg by mouth at bedtime.      . metoprolol succinate (TOPROL-XL) 25 MG 24 hr tablet Take 12.5 mg by mouth daily after breakfast.       . omeprazole (PRILOSEC) 20 MG capsule Take 20 mg by mouth daily with breakfast.      . traMADol (ULTRAM) 50 MG tablet Take 50 mg by mouth every 6 (six) hours as needed. For pain      . verapamil (CALAN-SR) 240 MG CR tablet Take 240 mg by mouth daily with breakfast.       . DISCONTD: naproxen sodium (ANAPROX) 220 MG tablet Take 220 mg by mouth 2 (two) times daily as needed. For pain      . oxazepam (SERAX) 10 MG capsule Take 10 mg by mouth at bedtime as  needed.       Assessment: 72 yo female s/p lumbar laminectomy and fusion to begin vancomycin for drainage from surgical site. Renal function is normal.  Also on empiric bactrim for UTI.  Goal of Therapy:  Vancomycin trough level 10-15 mcg/ml  Plan:  -Vancomycin 1 g IV q12h -Follow-up renal function, cultures, clinical course.  Talbert Cage, Pharm.D. Clinical Pharmacist Pager: [phone] 02/12/2012 10:06 AM

## 2012-02-12 NOTE — Progress Notes (Signed)
Patient ID: Fiana Gladu, female   DOB: Aug 04, 1940, 71 y.o.   MRN: [id number] Subjective:  The patient is alert and pleasant. By report she time she has been confused. She has had some drainage from her wound.  Objective: Vital signs in last 24 hours: Temp:  [97.7 F (36.5 C)-98.3 F (36.8 C)] 98 F (36.7 C) (09/14 0617) Pulse Rate:  [92-100] 92  (09/14 0617) Resp:  [18-20] 18  (09/14 0617) BP: (125-150)/(55-65) 150/64 mmHg (09/14 0617) SpO2:  [93 %-98 %] 96 % (09/14 0617)  Intake/Output from previous day: 09/13 0701 - 09/14 0700 In: -  Out: 1750 [Urine:1750] Intake/Output this shift:    Physical exam the patient is alert and oriented. She is moving her lower extremities well.  Lab Results:  Basename 02/12/12 0330 02/10/12 0910  WBC 5.4 7.6  HGB 9.6* 10.0*  HCT 30.1* 31.3*  PLT 231 155   BMET  Basename 02/12/12 0330 02/10/12 0910  NA 140 140  K 3.9 4.4  CL 103 103  CO2 30 32  GLUCOSE 89 131*  BUN 6 4*  CREATININE 0.58 0.50  CALCIUM 8.6 8.6    Studies/Results: No results found.  Assessment/Plan: Postop day 5: The patient is be mobilize with PT and OT.  Urine tract infection : The patient has been started on Bactrim empiric leak. We are awaiting the cultures.  Wound drainage: I will add Kefzol and await the urine cultures  LOS: 5 days     Shikha Bibb D 02/12/2012, 9:17 AM

## 2012-02-12 NOTE — Progress Notes (Signed)
At 0300, Dr. Yetta Barre called regarding new confusion in pt. Pt found by nurse sitting on side of bed. When asked if she needed anything, pt stated she didn't know where she was. When told she had a back surgery, she stated, "I did?". Pt still confused after attempting to reorient. Pt thinks she is in [place]. VS stable, 95% on room air. Pt able to follow commands. No neuro deficits other than confusion. STAT CBC and BMET ordered as well as UA. Will continue to monitor pt.  Salvadore Oxford, RN 02/12/12 [phone]

## 2012-02-12 NOTE — Progress Notes (Signed)
Physical Therapy Treatment Patient Details Name: Kim Mcgee MRN: [id number] DOB: Jul 09, 1940 Today's Date: 02/12/2012 Time: 4098-1191 PT Time Calculation (min): 26 min  PT Assessment / Plan / Recommendation Comments on Treatment Session  Pt continues to improve with ambulation, although still requires verbal cueing for safe use of RW. Pt encouraged to ambulate with nursing 2--3 more times today and educated about the importance of increased mobility.    Follow Up Recommendations  Home health PT;Supervision for mobility/OOB    Barriers to Discharge        Equipment Recommendations  Rolling walker with 5" wheels;3 in 1 bedside comode    Recommendations for Other Services    Frequency Min 5X/week   Plan Discharge plan remains appropriate;Frequency remains appropriate    Precautions / Restrictions Precautions Precautions: Back Required Braces or Orthoses: Spinal Brace Spinal Brace: Lumbar corset Restrictions Weight Bearing Restrictions: No   Pertinent Vitals/Pain Pt reports 7/10 pain in right hip, stating "I must have slept in the same position all night."  Nursing notified.    Mobility  Bed Mobility Bed Mobility: Rolling Left;Left Sidelying to Sit Rolling Left: 5: Supervision;With rail Left Sidelying to Sit: 5: Supervision;With rails Details for Bed Mobility Assistance: Pt requried verbal cueing for sequencing of log rolling and maintaining back precautions. Transfers Transfers: Sit to Stand;Stand to Sit Sit to Stand: 4: Min assist;With upper extremity assist;From bed;From toilet Stand to Sit: 4: Min guard;To chair/3-in-1;To toilet Details for Transfer Assistance: Pt required minimal verbal cueing for safe hand placement and facilitation at sacrum to come to full upright standing. Ambulation/Gait Ambulation/Gait Assistance: 4: Min guard Ambulation Distance (Feet): 150 Feet Assistive device: Rolling walker Ambulation/Gait Assistance Details: Pt required verbal cueing for  safe use of RW and upright posture. Gait Pattern: Step-through pattern;Decreased stride length;Trunk flexed Stairs: No     PT Goals Acute Rehab PT Goals PT Goal: Rolling Supine to Left Side - Progress: Progressing toward goal PT Goal: Supine/Side to Sit - Progress: Progressing toward goal PT Goal: Sit to Stand - Progress: Progressing toward goal PT Goal: Stand to Sit - Progress: Progressing toward goal PT Goal: Ambulate - Progress: Progressing toward goal Additional Goals PT Goal: Additional Goal #1 - Progress: Progressing toward goal  Visit Information  Last PT Received On: 02/12/12 Assistance Needed: +1    Subjective Data  Subjective: "I feel like I ran a marathon lasat night."   Cognition  Overall Cognitive Status: Appears within functional limits for tasks assessed/performed Arousal/Alertness: Awake/alert Orientation Level: Oriented X4 / Intact Behavior During Session: [facility] for tasks performed Cognition - Other Comments: Nursing reports pt was mildly confused earlier this morning but pt was A&O x4.    Balance  Balance Balance Assessed: No  End of Session PT - End of Session Equipment Utilized During Treatment: Gait belt;Back brace Activity Tolerance: Patient tolerated treatment well Patient left: in chair;with call bell/phone within reach;with family/visitor present Nurse Communication: Mobility status;Other (comment) (Pt's IV beeping)   GP     Jalila Goodnough 02/12/2012, 8:48 AM

## 2012-02-13 MED ORDER — FLEET ENEMA 7-19 GM/118ML RE ENEM
1.0000 | ENEMA | Freq: Every day | RECTAL | Status: DC | PRN
  Administered 2012-02-13 – 2012-02-18 (×2): 1 via RECTAL
  Filled 2012-02-13 (×3): qty 1

## 2012-02-13 MED ORDER — BISACODYL 5 MG PO TBEC
10.0000 mg | DELAYED_RELEASE_TABLET | Freq: Every day | ORAL | Status: DC | PRN
  Administered 2012-02-13 – 2012-02-24 (×4): 10 mg via ORAL
  Filled 2012-02-13 (×3): qty 2
  Filled 2012-02-13: qty 1

## 2012-02-13 MED ORDER — WHITE PETROLATUM GEL
Status: AC
  Filled 2012-02-13: qty 5

## 2012-02-13 MED ORDER — MAGNESIUM CITRATE PO SOLN
1.0000 | Freq: Once | ORAL | Status: AC
  Administered 2012-02-13: 1 via ORAL
  Filled 2012-02-13: qty 296

## 2012-02-13 NOTE — Progress Notes (Signed)
Through the night pt had two episodes of disorientation and confusion, stated she was scared, didn't know where she was or why. Pt reoriented and repeatedly assured she was safe.Both times periods of confusion lasted few minutes only,and pt was reoriented. Will continue to monitor closely.

## 2012-02-13 NOTE — Progress Notes (Signed)
Physical Therapy Treatment Patient Details Name: Kim Mcgee MRN: [id number] DOB: 11/19/1940 Today's Date: 02/13/2012 Time: 1478-2956 PT Time Calculation (min): 24 min  PT Assessment / Plan / Recommendation Comments on Treatment Session  Pt with improved ambulation and distance this session, still requiring minimal verbal cueing for safe use of RW (especially during turns). Pt able to verbalize 2/3 back precautions and requires minimal verbal cueing to maintian during functional mobility.    Follow Up Recommendations  Home health PT;Supervision for mobility/OOB    Barriers to Discharge        Equipment Recommendations  Rolling walker with 5" wheels;3 in 1 bedside comode    Recommendations for Other Services    Frequency Min 5X/week   Plan Discharge plan remains appropriate;Frequency remains appropriate    Precautions / Restrictions Precautions Precautions: Back Precaution Comments: Pt able to verbalize 2/3 back precautions. Required Braces or Orthoses: Spinal Brace Spinal Brace: Thoracolumbosacral orthotic Restrictions Weight Bearing Restrictions: No   Pertinent Vitals/Pain Pt's O2 monitored during session and ranged from 91-95%.    Mobility  Bed Mobility Bed Mobility: Rolling Left;Left Sidelying to Sit Rolling Left: 5: Supervision;With rail Left Sidelying to Sit: 5: Supervision;With rails;HOB flat Details for Bed Mobility Assistance: Pt requried minimal verbal cueing to maintain back preacautions. Transfers Transfers: Sit to Stand;Stand to Sit Sit to Stand: 4: Min assist;With upper extremity assist;From bed Stand to Sit: 4: Min guard;With upper extremity assist;To chair/3-in-1 Details for Transfer Assistance: Pt demonstrating safe hand placement with transfers and requried facilitation at sacrum to come to full upright standing. Ambulation/Gait Ambulation/Gait Assistance: 4: Min guard Ambulation Distance (Feet): 200 Feet Assistive device: Rolling  walker Ambulation/Gait Assistance Details: Pt continues to require occasional verbal cueing for safe use of RW and upright posture. Gait Pattern: Step-through pattern;Decreased trunk rotation;Trunk flexed Stairs: No     PT Goals Acute Rehab PT Goals PT Goal: Rolling Supine to Left Side - Progress: Progressing toward goal PT Goal: Supine/Side to Sit - Progress: Progressing toward goal PT Goal: Sit to Stand - Progress: Progressing toward goal PT Goal: Stand to Sit - Progress: Progressing toward goal PT Goal: Ambulate - Progress: Progressing toward goal Additional Goals PT Goal: Additional Goal #1 - Progress: Progressing toward goal  Visit Information  Last PT Received On: 02/13/12 Assistance Needed: +1    Subjective Data  Subjective: "I feel much better this morning."   Cognition  Overall Cognitive Status: Appears within functional limits for tasks assessed/performed Arousal/Alertness: Awake/alert Orientation Level: Appears intact for tasks assessed Behavior During Session: [facility] for tasks performed    Balance  Balance Balance Assessed: No  End of Session PT - End of Session Equipment Utilized During Treatment: Gait belt;Back brace Activity Tolerance: Patient tolerated treatment well Patient left: in chair;with call bell/phone within reach Nurse Communication: Mobility status   GP     Eshan Trupiano 02/13/2012, 8:24 AM

## 2012-02-13 NOTE — Progress Notes (Signed)
Occupational Therapy Treatment Patient Details Name: Mertie Haslem MRN: [id number] DOB: 09-18-1940 Today's Date: 02/13/2012 Time: 4098-1191 OT Time Calculation (min): 29 min  OT Assessment / Plan / Recommendation Comments on Treatment Session Pt. continues to progress with ADLs.  Continues to require min cues for precautions and AE use.    Follow Up Recommendations  Home health OT;Supervision/Assistance - 24 hour    Barriers to Discharge       Equipment Recommendations  Rolling walker with 5" wheels;3 in 1 bedside comode    Recommendations for Other Services    Frequency Min 2X/week   Plan Discharge plan remains appropriate    Precautions / Restrictions Precautions Precautions: Back Precaution Booklet Issued: Yes (comment) Precaution Comments: Requires min cues to adhere to precautions Required Braces or Orthoses: Spinal Brace Spinal Brace: Thoracolumbosacral orthotic Restrictions Weight Bearing Restrictions: No   Pertinent Vitals/Pain     ADL  Lower Body Bathing: Simulated;Minimal assistance (with AE) Where Assessed - Lower Body Bathing: Supported sit to stand Lower Body Dressing: Performed;Minimal assistance (with AE) Where Assessed - Lower Body Dressing: Supported sit to stand Toilet Transfer: Performed;Min Pension scheme manager Method: Sit to Barista: Comfort height toilet Toileting - Clothing Manipulation and Hygiene: Performed;Min guard (clothing only) Where Assessed - Toileting Clothing Manipulation and Hygiene: Sit to stand from 3-in-1 or toilet Equipment Used: Reacher;Long-handled sponge;Long-handled shoe horn;Back brace;Rolling walker Transfers/Ambulation Related to ADLs: min guard assist ADL Comments: Pt. requires min cues for use of AE this date.  Husband reports that he has purchased AE for home use.      OT Diagnosis:    OT Problem List:   OT Treatment Interventions:     OT Goals ADL Goals ADL Goal: Lower Body Bathing -  Progress: Progressing toward goals ADL Goal: Lower Body Dressing - Progress: Progressing toward goals ADL Goal: Toilet Transfer - Progress: Progressing toward goals ADL Goal: Toileting - Hygiene - Progress: Progressing toward goals ADL Goal: Additional Goal #1 - Progress: Progressing toward goals  Visit Information  Last OT Received On: 02/13/12 Assistance Needed: +1    Subjective Data      Prior Functioning       Cognition  Overall Cognitive Status: Appears within functional limits for tasks assessed/performed Arousal/Alertness: Awake/alert Orientation Level: Appears intact for tasks assessed Behavior During Session: [facility] for tasks performed    Mobility  Shoulder Instructions Transfers Transfers: Sit to Stand;Stand to Sit Sit to Stand: 4: Min guard;With upper extremity assist;From chair/3-in-1;From toilet;From bed Stand to Sit: 4: Min guard;With upper extremity assist;To chair/3-in-1;To toilet Details for Transfer Assistance: Requires cues to keep walker in front of her and for correct hand placement       Exercises      Balance     End of Session OT - End of Session Equipment Utilized During Treatment: Back brace Activity Tolerance: Patient tolerated treatment well Patient left: in chair;with call bell/phone within reach;with family/visitor present  GO     Ameir Faria, Ursula Alert M 02/13/2012, 1:01 PM

## 2012-02-13 NOTE — Progress Notes (Signed)
Patient ID: Kim Mcgee, female   DOB: August 22, 1940, 71 y.o.   MRN: [id number] Subjective: Patient reports she is doing better. Her back is still sore. She had significant leg pain yesterday but doesn't have as much this morning. She states she feels she is just more alert and overall feels better.  Objective: Vital signs in last 24 hours: Temp:  [97.7 F (36.5 C)-98.1 F (36.7 C)] 98.1 F (36.7 C) (09/15 0551) Pulse Rate:  [84-93] 84  (09/15 0551) Resp:  [18] 18  (09/15 0551) BP: (119-151)/(46-69) 143/63 mmHg (09/15 0551) SpO2:  [89 %-98 %] 89 % (09/15 0551)  Intake/Output from previous day: 09/14 0701 - 09/15 0700 In: -  Out: 3950 [Urine:3950] Intake/Output this shift: Total I/O In: -  Out: 2700 [Urine:2700]  Neurologic: Grossly normal  Lab Results: Lab Results  Component Value Date   WBC 5.4 02/12/2012   HGB 9.6* 02/12/2012   HCT 30.1* 02/12/2012   MCV 87.2 02/12/2012   PLT 231 02/12/2012   Lab Results  Component Value Date   INR 0.98 02/02/2012   BMET Lab Results  Component Value Date   NA 140 02/12/2012   K 3.9 02/12/2012   CL 103 02/12/2012   CO2 30 02/12/2012   GLUCOSE 89 02/12/2012   BUN 6 02/12/2012   CREATININE 0.58 02/12/2012   CALCIUM 8.6 02/12/2012    Studies/Results: No results found.  Assessment/Plan: UTI is being treated with antibiotics and she feels better. Continue to mobilize. Still has catheter and for urinary retention but hopefully that will resolve as a urinary tract infection was treated.   LOS: 6 days    Arjay Jaskiewicz S 02/13/2012, 6:50 AM

## 2012-02-13 NOTE — Progress Notes (Signed)
I have read and agree with the below treatment plan and note. Ivonne Andrew PT, DPT Pager: [phone]

## 2012-02-14 MED ORDER — HEPARIN (PORCINE) IN NACL 100-0.45 UNIT/ML-% IJ SOLN
1300.0000 [IU]/h | INTRAMUSCULAR | Status: DC
  Administered 2012-02-14: 1000 [IU]/h via INTRAVENOUS
  Administered 2012-02-15: 1300 [IU]/h via INTRAVENOUS
  Filled 2012-02-14 (×2): qty 250

## 2012-02-14 NOTE — Progress Notes (Signed)
ANTICOAGULATION CONSULT NOTE - Initial Consult  Pharmacy Consult for Heparin  Indication: DVT  Allergies  Allergen Reactions  . Macrodantin (Nitrofurantoin)     Chest pain  . Penicillins Rash  . Tape Rash    Patient Measurements: Height: 5\' 2"  (157.5 cm) Weight: 196 lb 6.9 oz (89.1 kg) IBW/kg (Calculated) : 50.1  Heparin Dosing Weight: 70.5 kg  Vital Signs: Temp: 97.9 F (36.6 C) (09/16 1810) Temp src: Oral (09/16 1810) BP: 124/56 mmHg (09/16 1810) Pulse Rate: 90  (09/16 1810)  Labs:  Basename 02/12/12 0330  HGB 9.6*  HCT 30.1*  PLT 231  APTT --  LABPROT --  INR --  HEPARINUNFRC --  CREATININE 0.58  CKTOTAL --  CKMB --  TROPONINI --    Estimated Creatinine Clearance: 66.9 ml/min (by C-G formula based on Cr of 0.58).   Medical History: Past Medical History  Diagnosis Date  . Hypertension   . Hypothyroidism   . Anxiety   . Asthma   . Shortness of breath     /w walking long distance   . Dysrhythmia   . GERD (gastroesophageal reflux disease)   . Cancer     basal cell CA  . Arthritis     lumbar DDD  . Normal cardiac stress test   . Swallowing difficulty     being seen by ENT- had endoscopy- told that its probably related to past neck surgeries     Medications:  Scheduled:    . digoxin  0.125 mg Oral QPC breakfast  . docusate sodium  100 mg Oral BID  . levothyroxine  125 mcg Oral QAC breakfast  . metoprolol succinate  12.5 mg Oral QPC breakfast  . pantoprazole  40 mg Oral BID AC  . simvastatin  10 mg Oral q1800  . sulfamethoxazole-trimethoprim  1 tablet Oral Q12H  . vancomycin  1,000 mg Intravenous Q12H  . verapamil  240 mg Oral Q breakfast  . white petrolatum        Assessment: 71 yr old female admitted on 9/9 for a lumbar laminectomy and fusion. Pt was started on vancomycin for drainage from the surgical site and then on Bactrim for a UTI. Today the pt's rt leg was swollen, red and warm MD ordered heparin to start with no bolus. Goal of  Therapy:  Heparin level 0.3-0.7 units/ml Monitor platelets by anticoagulation protocol: Yes   Plan:  Will start a heparin drip at 1000 units/hr, no bolus. Will get daily AM heparin level and CBC.  Eugene Garnet 02/14/2012,9:47 PM

## 2012-02-14 NOTE — Progress Notes (Signed)
PT Cancellation Note  Per husband request we checked on pt for stair training this pm. Pt requests stair training early Tuesday morning 2/2 to patient having just received a suppository.   [facility] HELEN 02/14/2012, 2:49 PM Pager: [phone]

## 2012-02-14 NOTE — Progress Notes (Signed)
Occupational Therapy Treatment Patient Details Name: Kim Mcgee MRN: [id number] DOB: 1940-09-25 Today's Date: 02/14/2012 Time: 4098-1191 OT Time Calculation (min): 26 min  OT Assessment / Plan / Recommendation Comments on Treatment Session Session focused on AE use with toileting as well as LB dressing and bathing. Next session to focus on shower transfer.    Follow Up Recommendations  Home health OT;Supervision/Assistance - 24 hour    Barriers to Discharge       Equipment Recommendations  Rolling walker with 5" wheels;3 in 1 bedside comode    Recommendations for Other Services    Frequency Min 2X/week   Plan Discharge plan remains appropriate    Precautions / Restrictions Precautions Precautions: Back Precaution Comments: Patient able to adhere to all precautions Required Braces or Orthoses: Spinal Brace Spinal Brace: Thoracolumbosacral orthotic Restrictions Weight Bearing Restrictions: No   Pertinent Vitals/Pain Pt with no c/o pain during session    ADL  Grooming: Simulated;Supervision/safety;Wash/dry hands Where Assessed - Grooming: Unsupported standing Lower Body Dressing: Performed;Minimal assistance Where Assessed - Lower Body Dressing: Unsupported sit to stand Toilet Transfer: Performed;Supervision/safety Toilet Transfer Method: Sit to Barista: Bedside commode Toileting - Clothing Manipulation and Hygiene: Simulated;Min guard Where Assessed - Engineer, mining and Hygiene: Standing Equipment Used: Gait belt;Back brace;Rolling walker Transfers/Ambulation Related to ADLs: Supervision with ambulation throughout room ADL Comments: Pt and husband educated on use of AE for LB dsg and bathing with focus on LB dsg with reacher. Also, educated pt and husband on use of toileting aid as well as where to purchase if desired. Pt able to demonstrate understanding.     OT Diagnosis:    OT Problem List:   OT Treatment Interventions:      OT Goals ADL Goals ADL Goal: Grooming - Progress: Progressing toward goals ADL Goal: Lower Body Bathing - Progress: Progressing toward goals ADL Goal: Lower Body Dressing - Progress: Progressing toward goals ADL Goal: Toilet Transfer - Progress: Progressing toward goals ADL Goal: Toileting - Hygiene - Progress: Progressing toward goals ADL Goal: Additional Goal #1 - Progress: Met  Visit Information  Last OT Received On: 02/14/12 Assistance Needed: +1    Subjective Data      Prior Functioning       Cognition  Overall Cognitive Status: Appears within functional limits for tasks assessed/performed Arousal/Alertness: Awake/alert Orientation Level: Appears intact for tasks assessed Behavior During Session: [facility] for tasks performed    Mobility  Shoulder Instructions Transfers Sit to Stand: 5: Supervision;With armrests;From chair/3-in-1 Stand to Sit: 5: Supervision;With armrests;To chair/3-in-1 Details for Transfer Assistance: good hand placement       Exercises      Balance     End of Session OT - End of Session Equipment Utilized During Treatment: Gait belt;Back brace Activity Tolerance: Patient tolerated treatment well Patient left: in chair;with call bell/phone within reach;with family/visitor present Nurse Communication: Mobility status  GO     Tamiko Leopard 02/14/2012, 4:04 PM

## 2012-02-14 NOTE — Progress Notes (Signed)
Patient's feet seen this evening slightly swollen but non pitting. Elevated feet on pillow and reassured her. Will continue to monitor.

## 2012-02-14 NOTE — Progress Notes (Signed)
Filed Vitals:   02/14/12 0646 02/14/12 1006 02/14/12 1318 02/14/12 1810  BP: 150/59 152/50 164/60 124/56  Pulse: 86 86 88 90  Temp: 97.9 F (36.6 C) 97.7 F (36.5 C) 97.5 F (36.4 C) 97.9 F (36.6 C)  TempSrc: Oral Oral Oral Oral  Resp: 18 20 18 18   Height:      Weight:      SpO2: 94% 96% 94% 96%    CBC  Basename 02/12/12 0330  WBC 5.4  HGB 9.6*  HCT 30.1*  PLT 231   BMET  Basename 02/12/12 0330  NA 140  K 3.9  CL 103  CO2 30  GLUCOSE 89  BUN 6  CREATININE 0.58  CALCIUM 8.6    Called by nursing staff because the patient had complained of the sense of tightness in her distal right lower extremity, and when they took off her socks and examined her distal lower extremities, they found the right foot, ankle, and leg were swollen, redness and warmth.  I came to see the patient, discussed the circumstances over the past week, examined her, and reviewed the chart.  Exam shows swelling of the right foot, ankle, and leg, redness and warmth. There is no calf tenderness bilaterally, nor any Homans sign bilaterally.  The appearance is certainly suspicious for deep vein thrombosis (DVT) discussed the situation both with Dr. Billy Fischer (at [facility], since the patient had been seen last week by CCM) and Dr. Orbie Hurst. Dr. Sung Amabile has recommended anticoagulation with lower extremity venous Dopplers tomorrow, and Dr. Phoebe Perch does feel the patient can receive anticoagulation.  Plan: Anticoagulation per the recommendations of Dr. Sung Amabile. We appreciate his assistance in the care of this patient. He is arranging for CCM to followup on the patient's condition in the a.m.  Spoke with the patient and her husband at the bedside about my concerns and her plans for treatment and further evaluation. I did explain to them that I did consult both with Dr. Sung Amabile and Dr. Phoebe Perch. Their questions regarding her condition and our plans were answered for them.  Hewitt Shorts, MD 02/14/2012, 8:31  PM

## 2012-02-14 NOTE — Progress Notes (Signed)
Patient has ambulated on the hallway multiple times today. tolerated well.

## 2012-02-14 NOTE — Progress Notes (Signed)
Physical Therapy Treatment Patient Details Name: Kim Mcgee MRN: [id number] DOB: [birth year] Today's Date: 02/14/2012 Time: 9562-1308 PT Time Calculation (min): 32 min  PT Assessment / Plan / Recommendation Comments on Treatment Session  Patient continues to make great progress with all mobility. Husband concern about Foley placement and how much longer it will need to be in. Rn made aware. Patient will need to attempt stairs next session.     Follow Up Recommendations  Home health PT;Supervision for mobility/OOB    Barriers to Discharge        Equipment Recommendations  Rolling walker with 5" wheels;3 in 1 bedside comode    Recommendations for Other Services    Frequency Min 5X/week   Plan Discharge plan remains appropriate;Frequency remains appropriate    Precautions / Restrictions Precautions Precautions: Back Precaution Comments: Patient able to adhere to all precautions Required Braces or Orthoses: Spinal Brace Spinal Brace: Thoracolumbosacral orthotic   Pertinent Vitals/Pain Patient with increased pain this session, RN gave meds during session    Mobility  Bed Mobility Rolling Left: 5: Supervision;With rail Left Sidelying to Sit: 5: Supervision;With rails Sitting - Scoot to Edge of Bed: 5: Supervision Details for Bed Mobility Assistance: Patient with good technique. Will need to attempt without rail next session Transfers Sit to Stand: 4: Min guard;With upper extremity assist;From toilet;From bed Stand to Sit: 5: Supervision;With upper extremity assist;With armrests;To chair/3-in-1;To toilet Details for Transfer Assistance: Cues for positioning prior to sitting Ambulation/Gait Ambulation/Gait Assistance: 5: Supervision Ambulation Distance (Feet): 250 Feet Assistive device: Rolling walker Ambulation/Gait Assistance Details: Cues to stand closer to RW and to increase upright posture Gait Pattern: Step-through pattern;Decreased stride length;Trunk flexed      Exercises     PT Diagnosis:    PT Problem List:   PT Treatment Interventions:     PT Goals Acute Rehab PT Goals PT Goal: Rolling Supine to Left Side - Progress: Progressing toward goal PT Goal: Supine/Side to Sit - Progress: Progressing toward goal PT Goal: Sit to Stand - Progress: Progressing toward goal PT Goal: Stand to Sit - Progress: Progressing toward goal PT Goal: Ambulate - Progress: Progressing toward goal Additional Goals PT Goal: Additional Goal #1 - Progress: Progressing toward goal  Visit Information  Last PT Received On: 02/14/12 Assistance Needed: +1    Subjective Data      Cognition  Overall Cognitive Status: Appears within functional limits for tasks assessed/performed Arousal/Alertness: Awake/alert Orientation Level: Appears intact for tasks assessed Behavior During Session: [facility] for tasks performed    Balance     End of Session PT - End of Session Equipment Utilized During Treatment: Gait belt;Back brace Activity Tolerance: Patient tolerated treatment well Patient left: in chair;with call bell/phone within reach;with family/visitor present Nurse Communication: Mobility status   GP     Fredrich Birks 02/14/2012, 8:33 AM 02/14/2012 Fredrich Birks PTA [phone] pager [phone] office

## 2012-02-14 NOTE — Progress Notes (Signed)
Doing well. C/o appropriate incisional soreness. Foley in place -   Temp:  [97.4 F (36.3 C)-98.4 F (36.9 C)] 97.5 F (36.4 C) (09/16 1318) Pulse Rate:  [78-95] 88  (09/16 1318) Resp:  [18-20] 18  (09/16 1318) BP: (120-169)/(50-66) 164/60 mmHg (09/16 1318) SpO2:  [94 %-100 %] 94 % (09/16 1318) Good strength and sensation Incision CDI  Plan: Increase activity

## 2012-02-15 DIAGNOSIS — R0602 Shortness of breath: Secondary | ICD-10-CM

## 2012-02-15 LAB — HEPARIN LEVEL (UNFRACTIONATED)
Heparin Unfractionated: <0.1 [IU]/mL — ABNORMAL LOW (ref 0.30–0.70)
Heparin Unfractionated: <0.1 [IU]/mL — ABNORMAL LOW (ref 0.30–0.70)

## 2012-02-15 LAB — BASIC METABOLIC PANEL WITH GFR
BUN: 6 mg/dL (ref 6–23)
CO2: 29 meq/L (ref 19–32)
Calcium: 9.1 mg/dL (ref 8.4–10.5)
Chloride: 102 meq/L (ref 96–112)
Creatinine, Ser: 0.68 mg/dL (ref 0.50–1.10)
GFR calc Af Amer: >90 mL/min
GFR calc non Af Amer: 86 mL/min — ABNORMAL LOW
Glucose, Bld: 88 mg/dL (ref 70–99)
Potassium: 4 meq/L (ref 3.5–5.1)
Sodium: 138 meq/L (ref 135–145)

## 2012-02-15 LAB — CBC
HCT: 32.6 % — ABNORMAL LOW (ref 36.0–46.0)
Hemoglobin: 10.7 g/dL — ABNORMAL LOW (ref 12.0–15.0)
MCH: 28.2 pg (ref 26.0–34.0)
MCHC: 32.8 g/dL (ref 30.0–36.0)
MCV: 85.8 fL (ref 78.0–100.0)
Platelets: 401 10*3/uL — ABNORMAL HIGH (ref 150–400)
RBC: 3.8 MIL/uL — ABNORMAL LOW (ref 3.87–5.11)
RDW: 14.2 % (ref 11.5–15.5)
WBC: 7.2 10*3/uL (ref 4.0–10.5)

## 2012-02-15 MED ORDER — HEPARIN SODIUM (PORCINE) 5000 UNIT/ML IJ SOLN
5000.0000 [IU] | Freq: Three times a day (TID) | INTRAMUSCULAR | Status: DC
  Administered 2012-02-15 – 2012-02-25 (×28): 5000 [IU] via SUBCUTANEOUS
  Filled 2012-02-15 (×34): qty 1

## 2012-02-15 MED ORDER — HEPARIN (PORCINE) IN NACL 100-0.45 UNIT/ML-% IJ SOLN
1550.0000 [IU]/h | INTRAMUSCULAR | Status: DC
  Administered 2012-02-15: 1550 [IU]/h via INTRAVENOUS
  Filled 2012-02-15 (×2): qty 250

## 2012-02-15 NOTE — Progress Notes (Signed)
ANTICOAGULATION CONSULT NOTE - Follow Up Consult  Pharmacy Consult:  Heparin Indication:  Suspected DVT  Allergies  Allergen Reactions  . Macrodantin (Nitrofurantoin)     Chest pain  . Penicillins Rash  . Tape Rash    Patient Measurements: Height: 5\' 2"  (157.5 cm) Weight: 196 lb 6.9 oz (89.1 kg) IBW/kg (Calculated) : 50.1  Heparin Dosing Weight: 71 kg  Vital Signs: Temp: 98.1 F (36.7 C) (09/17 1021) Temp src: Oral (09/17 0623) BP: 140/55 mmHg (09/17 1021) Pulse Rate: 81  (09/17 1021)  Labs:  Basename 02/15/12 1328 02/15/12 0540  HGB -- 10.7*  HCT -- 32.6*  PLT -- 401*  APTT -- --  LABPROT -- --  INR -- --  HEPARINUNFRC <0.10* <0.10*  CREATININE -- 0.68  CKTOTAL -- --  CKMB -- --  TROPONINI -- --    Estimated Creatinine Clearance: 66.9 ml/min (by C-G formula based on Cr of 0.68).      Assessment: 71 yo female s/p lumbar laminectomy and fusion to continue IV heparin for suspected DVT.  Doppler negative for DVT and RN reported MD has been notified.  Heparin level undetectable, no complications or bleeding per RN.    Goal of Therapy:  Heparin level 0.3-0.7 units/ml Monitor platelets by anticoagulation protocol: Yes     Plan:  - Increase heparin gtt to 1550 units/hr - Check 8 hr HL - Continue daily HL / CBC - F/U with order to d/c Bactrim (noted plan for 3 day course) - F/U with anticoagulation plan as Doppler negative for DVT     Jaleen Grupp D. Laney Potash, PharmD, BCPS Pager:  [phone] 02/15/2012, 2:47 PM

## 2012-02-15 NOTE — Progress Notes (Signed)
Subjective: Patient reports less leg swelling  Objective: Vital signs in last 24 hours: Temp:  [97.5 F (36.4 C)-98.8 F (37.1 C)] 98.1 F (36.7 C) (09/17 1021) Pulse Rate:  [77-94] 81  (09/17 1021) Resp:  [18-20] 20  (09/17 1021) BP: (124-164)/(50-70) 140/55 mmHg (09/17 1021) SpO2:  [93 %-96 %] 95 % (09/17 1021)  Intake/Output from previous day: 09/16 0701 - 09/17 0700 In: -  Out: 5600 [Urine:5600] Intake/Output this shift: Total I/O In: 120 [P.O.:120] Out: -   Wound:slight serous drainge -  Moves LE well No/minimal LE swelling  Lab Results:  [facility] 02/15/12 0540  WBC 7.2  HGB 10.7*  HCT 32.6*  PLT 401*   BMET  Basename 02/15/12 0540  NA 138  K 4.0  CL 102  CO2 29  GLUCOSE 88  BUN 6  CREATININE 0.68  CALCIUM 9.1    Studies/Results: No results found.  Assessment/Plan: Await venous dopplers - on heparin drip - stop vanco - continue bactrim  LOS: 8 days     Chala Gul R, MD 02/15/2012, 11:41 AM

## 2012-02-15 NOTE — Progress Notes (Signed)
Bilateral:  No evidence of DVT, superficial thrombosis, or Baker's Cyst.   

## 2012-02-15 NOTE — Progress Notes (Signed)
OT Cancellation Note  Treatment cancelled today due to medical issues with patient which prohibited therapy.  Patient was started on Heparin drip last night around 10pm. Per patient and RN, patient with possible blood clot and on bedrest, however did not see bedrest order in chart. Per department protocol patient must be on Heparin x24 hours prior to ambulation. Will continue to monitor patient and at this time will not plan on activity until tomorrow AM unless otherwise ordered.   Thanks,    Alba Cory 02/15/2012, 10:58 AM

## 2012-02-15 NOTE — Progress Notes (Signed)
Patient bladder scanned; reports feeling slightly uncomfortable.  Three separate scans reveal 560,610 and 784 in three separate areas.  Will put foley in due to continuing inabiltity to empty bladder completely.

## 2012-02-15 NOTE — Progress Notes (Signed)
MD came to review pt this am, instructed to wait and get results for the doppler before he can order a piccline. Also instructed to continue in and out catheterization for three more times before foley is inserted.

## 2012-02-15 NOTE — Progress Notes (Signed)
ANTICOAGULATION CONSULT NOTE - Follow Up Consult  Pharmacy Consult for heparin Indication: DVT  Labs:  [facility] 02/15/12 0540  HGB 10.7*  HCT 32.6*  PLT 401*  APTT --  LABPROT --  INR --  HEPARINUNFRC <0.10*  CREATININE --  CKTOTAL --  CKMB --  TROPONINI --    Assessment: 71yo female undetectable on heparin with initial dosing for DVT.  Goal of Therapy:  Heparin level 0.3-0.7 units/ml   Plan:  Will increase heparin gtt by 4 units/kg/hr to 1300 units/hr and check level in 6-8hr.  Colleen Can PharmD BCPS 02/15/2012,6:43 AM

## 2012-02-15 NOTE — Progress Notes (Signed)
Patient complained of being dizzy after walking to the bathroom. BP 119/56, P 71. She has been put back to bed and monitoring further. Will therefore pass it on the next shift to evaluate if she can ambulate tonight.

## 2012-02-15 NOTE — Progress Notes (Signed)
Per IV team patient need PICC placement- Dr. Jule Ser made aware- per MD, AM rounding team to address PICC placement - endorsed to oncoming nurse.

## 2012-02-15 NOTE — Progress Notes (Signed)
Name: Kim Mcgee MRN: [id number] DOB: Apr 28, 1941  LOS: 8  CRITICAL CARE FOLLOWUP NOTE  History of Present Illness: 71 year old female with a past medical history significant for GERD who was admitted on 02/07/2012 for an elective decompressive lumbar laminectomy. She tolerated the procedure well without major complication but on 02/09/2012 in the late afternoon she developed the acute onset of shortness of breath.  This resolved and was associated with some chest discomfort. Cardiology was consulted due to possible EKG changes but they felt these were nonspecific. Pulmonary critical care was consulted for further evaluation.  PCCM followed while in ICU, signed off 9/12.   Re-consulted 9/17 for possible DVT.     Subjective:  Patient reports symptoms resolved from 9/16.  Denies acute complaints. No further swelling in extremities.    Antibiotics: Vanc (surg prophylaxis?) 9/9>> Bactrim 9/14 (UTI)>>>  Tests / Events: 9/9 Lumbar Laminectomy 02/09/2012 CT chest>> no evidence of pulmonary embolism, there is extensive atelectasis in the bases bilaterally with trace pleural effusions 9/17: venous doppler neg for DVT     Vital Signs:   Filed Vitals:   02/14/12 2200 02/15/12 0230 02/15/12 0623 02/15/12 1021  BP: 151/70 135/50 146/68 140/55  Pulse: 94 77 81 81  Temp: 98.8 F (37.1 C) 97.7 F (36.5 C) 97.8 F (36.6 C) 98.1 F (36.7 C)  TempSrc: Oral Oral Oral   Resp: 18 20 20 20   Height:      Weight:      SpO2: 93% 95% 96% 95%    Physical Examination: Gen: comfortable in bed, no acute distress HEENT: NCAT, PERRL, EOMi, OP clear,  Neck: supple without masses PULM: CTA CV: RRR, no mgr, no JVD AB: BS+, soft, nontender, no hsm Ext: warm, trace edema, no clubbing, no cyanosis Derm: no rash or skin breakdown Neuro: A&Ox4, maew Psyche: Normal mood and affect  Labs and Imaging:   Venous doppler: neg for dvt CT angio chest : neg for PE   CBC    Component Value Date/Time   WBC 7.2  02/15/2012 0540   RBC 3.80* 02/15/2012 0540   HGB 10.7* 02/15/2012 0540   HCT 32.6* 02/15/2012 0540   PLT 401* 02/15/2012 0540   MCV 85.8 02/15/2012 0540   MCH 28.2 02/15/2012 0540   MCHC 32.8 02/15/2012 0540   RDW 14.2 02/15/2012 0540   LYMPHSABS 1.6 02/10/2012 0910   MONOABS 0.9 02/10/2012 0910   EOSABS 0.2 02/10/2012 0910   BASOSABS 0.0 02/10/2012 0910    BMET    Component Value Date/Time   NA 138 02/15/2012 0540   K 4.0 02/15/2012 0540   CL 102 02/15/2012 0540   CO2 29 02/15/2012 0540   GLUCOSE 88 02/15/2012 0540   BUN 6 02/15/2012 0540   CREATININE 0.68 02/15/2012 0540   CALCIUM 9.1 02/15/2012 0540   GFRNONAA 86* 02/15/2012 0540   GFRAA >90 02/15/2012 0540    Assessment and Plan:  Active Problems:  Shortness of breath  Spinal stenosis  Hypoxemia  H/O laminectomy  GERD (gastroesophageal reflux disease)  Vocal cord dysfunction  Chest pain  71 year old female s/p laminectomy she developed the sudden onset of shortness of breath in the setting of mild hypoxemia. Her symptoms have now resolved. Apparently she had nonspecific EKG changes which had been reviewed by cardiology and felt to be not consistent with ischemic heart disease.  9/12 PCCM signed off.   02/17/15 evening had lower extremity swelling & pain concerning for DVT and was started on empiric heparin via ELINK.  Preliminary lower extremity doppler 9/17 negative for DVT.   Lower extremity swelling, pain - Patient indicates she had ambulated and was more active yesterday.  Symptoms have completely resolved 9/17.  Negative exam.  DVT has been ruled out Plan: -d/c IV heparin -start heparin SQ proph   Status post laminectomy Plan: -per NSGY -continue PT/OT efforts   UTI Plan: -bactrim (consider 3 days then stop)  Canary Brim, NP-C [facility] Pgr: [phone] or [phone]     Pt seen and examined with NP Nathaniel Man does NOT have DVT or PE on studies Ok to resume activity and d/c IV heparin but  cont DVT proph with sq heparin   Caryl Bis  [phone]  Cell  [phone]  If no response or cell goes to voicemail, call beeper [phone]

## 2012-02-15 NOTE — Progress Notes (Signed)
Right foot and ankle remain swollen and warm to touch, pulses palpable; facial and neck redness also noted; patient started on heparin drip per protocol; urinary  tension  noted post discontinuation of foley- bladder scanned X 2-  showed 500 ml and 950 ml in bladder respectively; patient in & out cath x 2 to 1200 and 1000 outputs respectively- urine. yellow/clear.

## 2012-02-15 NOTE — Progress Notes (Signed)
PT Cancellation Note  Treatment cancelled today due to medical issues with patient which prohibited therapy. Patient was started on Heparin drip last night around 10pm. Per patient and RN, patient with possible blood clot and on bedrest, however did not see bedrest order in chart. Per department protocol patient must be on Heparin x24 hours prior to ambulation. Will continue to monitor patient and at this time will not plan on activity until tomorrow AM unless otherwise ordered.  Thanks 02/15/2012 Fredrich Birks PTA [phone] pager [phone] office     Fredrich Birks 02/15/2012, 8:02 AM

## 2012-02-16 MED ORDER — MORPHINE SULFATE 2 MG/ML IJ SOLN
1.0000 mg | INTRAMUSCULAR | Status: DC | PRN
  Administered 2012-02-21 – 2012-02-22 (×4): 2 mg via INTRAVENOUS
  Filled 2012-02-16 (×4): qty 1

## 2012-02-16 MED ORDER — TAMSULOSIN HCL 0.4 MG PO CAPS
0.4000 mg | ORAL_CAPSULE | Freq: Every day | ORAL | Status: DC
  Administered 2012-02-16 – 2012-02-18 (×3): 0.4 mg via ORAL
  Filled 2012-02-16 (×3): qty 1

## 2012-02-16 NOTE — Plan of Care (Signed)
Problem: Discharge Progression Outcomes Goal: Pain controlled with appropriate interventions Outcome: Progressing Patient able to verbalize pain reduction with current plan in place for pain and back spasms.

## 2012-02-16 NOTE — Progress Notes (Signed)
Name: Kim Mcgee MRN: [id number] DOB: 10-28-1940  LOS: 9  CRITICAL CARE FOLLOWUP NOTE  History of Present Illness: 71 year old female with a past medical history significant for GERD who was admitted on 02/07/2012 for an elective decompressive lumbar laminectomy. She tolerated the procedure well without major complication but on 02/09/2012 in the late afternoon she developed the acute onset of shortness of breath.  This resolved and was associated with some chest discomfort. Cardiology was consulted due to possible EKG changes but they felt these were nonspecific. Pulmonary critical care was consulted for further evaluation.  PCCM followed while in ICU, signed off 9/12.   Re-consulted 9/17 for possible DVT.     Subjective: NAD at rest   Antibiotics: Vanc (surg prophylaxis?) 9/9>> Bactrim 9/14 (UTI)>>>  Tests / Events: 9/9 Lumbar Laminectomy 02/09/2012 CT chest>> no evidence of pulmonary embolism, there is extensive atelectasis in the bases bilaterally with trace pleural effusions 9/17: venous doppler neg for DVT     Vital Signs:   Filed Vitals:   02/15/12 1810 02/15/12 2256 02/16/12 0207 02/16/12 0551  BP:  145/61 116/53 128/76  Pulse: 72 79 83 87  Temp: 98 F (36.7 C) 97.7 F (36.5 C) 97.8 F (36.6 C) 97.8 F (36.6 C)  TempSrc:  Oral Oral Oral  Resp: 20 18 15 16   Height:      Weight:      SpO2: 93% 97% 91% 93%    Physical Examination: Gen: comfortable in bed, no acute distress. Complains of urinary retention,currently has foley HEENT: NCAT, PERRL, EOMi, OP clear,  Neck: supple without masses PULM: CTA CV: RRR, no mgr, no JVD AB: BS+, soft, nontender, no hsm Ext: warm, trace edema, no clubbing, no cyanosis Derm: no rash or skin breakdown Neuro: A&Ox4, maew Psyche: Normal mood and affect  Labs and Imaging:   Venous doppler: neg for dvt CT angio chest : neg for PE   CBC    Component Value Date/Time   WBC 7.2 02/15/2012 0540   RBC 3.80* 02/15/2012 0540   HGB  10.7* 02/15/2012 0540   HCT 32.6* 02/15/2012 0540   PLT 401* 02/15/2012 0540   MCV 85.8 02/15/2012 0540   MCH 28.2 02/15/2012 0540   MCHC 32.8 02/15/2012 0540   RDW 14.2 02/15/2012 0540   LYMPHSABS 1.6 02/10/2012 0910   MONOABS 0.9 02/10/2012 0910   EOSABS 0.2 02/10/2012 0910   BASOSABS 0.0 02/10/2012 0910    BMET    Component Value Date/Time   NA 138 02/15/2012 0540   K 4.0 02/15/2012 0540   CL 102 02/15/2012 0540   CO2 29 02/15/2012 0540   GLUCOSE 88 02/15/2012 0540   BUN 6 02/15/2012 0540   CREATININE 0.68 02/15/2012 0540   CALCIUM 9.1 02/15/2012 0540   GFRNONAA 86* 02/15/2012 0540   GFRAA >90 02/15/2012 0540    Assessment and Plan:  Active Problems:  Shortness of breath  Spinal stenosis  Hypoxemia  H/O laminectomy  GERD (gastroesophageal reflux disease)  Vocal cord dysfunction  Chest pain  71 year old female s/p laminectomy she developed the sudden onset of shortness of breath in the setting of mild hypoxemia. Her symptoms have now resolved. Apparently she had nonspecific EKG changes which had been reviewed by cardiology and felt to be not consistent with ischemic heart disease.  9/12 PCCM signed off.   02/17/15 evening had lower extremity swelling & pain concerning for DVT and was started on empiric heparin via ELINK.  Preliminary lower extremity doppler 9/17 negative for DVT.  Lower extremity swelling, pain - Patient indicates she had ambulated and was more active yesterday.  Symptoms have completely resolved 9/17.  Negative exam.  DVT has been ruled out Plan: -d/c IV heparin on 9/17 -cont  heparin SQ proph   Status post laminectomy Plan: -per NSGY -continue PT/OT efforts   UTI Plan: -bactrim (consider 3 days then stop)  SHe does NOT have DVT or PE on studies Ok to resume activity and d/c IV heparin but cont DVT proph with sq heparin  PCCM to sign off 9/18  [facility] Minor ACNP Adolph Pollack PCCM Pager [phone] till 3 pm If no answer page [phone] 02/16/2012, 12:57 PM

## 2012-02-16 NOTE — Progress Notes (Signed)
Tech came out of room and made this nurse aware that patient appeared confused.  Patient sitting on the side of the bed when nurse entered the room. She was wanting to go to the bathroom; stating she had to urinate. Reminded her that she had a foley collecting her urine and she said she still felt like she had to pee.  Nurse explained that was a totally normal sensation.  Encouraged patient to lay back in bed and was repositioned.  Instructed patient that her confusion was most likely due to the combination of medications she had received earlier. Will continue to monitor patient.

## 2012-02-16 NOTE — Progress Notes (Signed)
Physical Therapy Treatment Patient Details Name: Kim Mcgee MRN: [id number] DOB: 1940-10-20 Today's Date: 02/16/2012 Time: 9147-8295 PT Time Calculation (min): 24 min  PT Assessment / Plan / Recommendation Comments on Treatment Session  Patient continues to make great progress. Could benefit from practicing stairs with husband next session    Follow Up Recommendations  Home health PT    Barriers to Discharge        Equipment Recommendations  Rolling walker with 5" wheels;3 in 1 bedside comode    Recommendations for Other Services    Frequency Min 5X/week   Plan Discharge plan remains appropriate;Frequency remains appropriate    Precautions / Restrictions Precautions Precautions: Back Precaution Comments: Patient able to recall 2/3. Cues for no arching Required Braces or Orthoses: Spinal Brace Spinal Brace: Applied in sitting position Restrictions Weight Bearing Restrictions: No   Pertinent Vitals/Pain     Mobility  Bed Mobility Bed Mobility: Rolling Left;Left Sidelying to Sit Rolling Left: 6: Modified independent (Device/Increase time) Left Sidelying to Sit: 6: Modified independent (Device/Increase time) Sitting - Scoot to Edge of Bed: 5: Supervision Details for Bed Mobility Assistance: Removed bed rail and made HOB flat.  Pt used good technique. Transfers Sit to Stand: 5: Supervision;From bed;With upper extremity assist Stand to Sit: With upper extremity assist;To chair/3-in-1;5: Supervision Ambulation/Gait Ambulation/Gait Assistance: 5: Supervision Ambulation Distance (Feet): 350 Feet Assistive device: Rolling walker Ambulation/Gait Assistance Details: Cues for posture Gait Pattern: Step-through pattern Stairs: Yes Stairs Assistance: 4: Min assist Stairs Assistance Details (indicate cue type and reason): Min A for HHA on Left Stair Management Technique: One rail Right Number of Stairs: 3     Exercises     PT Diagnosis:    PT Problem List:   PT Treatment  Interventions:     PT Goals Acute Rehab PT Goals PT Goal: Rolling Supine to Left Side - Progress: Progressing toward goal PT Goal: Supine/Side to Sit - Progress: Progressing toward goal PT Goal: Sit to Stand - Progress: Progressing toward goal PT Goal: Stand to Sit - Progress: Progressing toward goal PT Goal: Ambulate - Progress: Progressing toward goal PT Goal: Up/Down Stairs - Progress: Progressing toward goal Additional Goals PT Goal: Additional Goal #1 - Progress: Progressing toward goal  Visit Information  Last PT Received On: 02/16/12 Assistance Needed: +1    Subjective Data      Cognition  Overall Cognitive Status: Appears within functional limits for tasks assessed/performed Arousal/Alertness: Awake/alert Orientation Level: Appears intact for tasks assessed Behavior During Session: [facility] for tasks performed    Balance     End of Session PT - End of Session Equipment Utilized During Treatment: Gait belt;Back brace Activity Tolerance: Patient tolerated treatment well Patient left: in chair;with call bell/phone within reach Nurse Communication: Mobility status   GP     Fredrich Birks 02/16/2012, 12:01 PM 02/16/2012 Fredrich Birks PTA [phone] pager [phone] office

## 2012-02-16 NOTE — Progress Notes (Signed)
Doing well. C/o appropriate incisional soreness. Some Right leg pain pain No Numbness, tingling, weakness No Nausea /vomiting Amb well, foley inplace  Temp:  [97.7 F (36.5 C)-98.1 F (36.7 C)] 97.8 F (36.6 C) (09/18 0551) Pulse Rate:  [72-87] 87  (09/18 0551) Resp:  [15-20] 16  (09/18 0551) BP: (112-145)/(47-76) 128/76 mmHg (09/18 0551) SpO2:  [91 %-97 %] 93 % (09/18 0551) Good strength and sensation Incision CDI  Venous dop. - no DVT  Plan: Increase activity - start flomax - d/c foley in am

## 2012-02-16 NOTE — Progress Notes (Signed)
Occupational Therapy Treatment Patient Details Name: Kim Mcgee MRN: [id number] DOB: [birth year] Today's Date: 02/16/2012 Time: 1478-2956 OT Time Calculation (min): 24 min  OT Assessment / Plan / Recommendation Comments on Treatment Session Shower transfer education completed. Also instructed pt in scenarios at home in which back precautions may be jeopardized.    Follow Up Recommendations  Home health OT;Supervision/Assistance - 24 hour    Barriers to Discharge       Equipment Recommendations  Rolling walker with 5" wheels;3 in 1 bedside comode    Recommendations for Other Services    Frequency Min 2X/week   Plan Discharge plan remains appropriate    Precautions / Restrictions Precautions Precautions: Back Precaution Comments: pt able to state 2/3 back precautions (not arching) Required Braces or Orthoses: Spinal Brace Spinal Brace: Thoracolumbosacral orthotic Restrictions Weight Bearing Restrictions: No   Pertinent Vitals/Pain     ADL  Upper Body Dressing: Performed;Set up Where Assessed - Upper Body Dressing: Unsupported sitting Toilet Transfer: Simulated;Supervision/safety Toilet Transfer Method: Sit to Barista: Comfort height toilet Tub/Shower Transfer: Performed;Minimal assistance (hand held) Web designer Method: Science writer:  (pt has built in seat) Equipment Used: Gait belt;Back brace;Rolling walker Transfers/Ambulation Related to ADLs: supervision with RW ADL Comments: Pt educated on optimal chair for home use and positioning to adhere to back precautions.  Reminded to change position every hour.  Pt has a borrowed shower seat she may use if built in shower seat is too low.    OT Diagnosis:    OT Problem List:   OT Treatment Interventions:     OT Goals ADL Goals Pt Will Perform Grooming: Independently;Standing at sink Pt Will Perform Upper Body Bathing: with set-up;Sitting in shower Pt Will  Perform Lower Body Bathing: with supervision;Sit to stand in shower;Sitting, chair Pt Will Perform Lower Body Dressing: with modified independence;with adaptive equipment;Sit to stand from chair;Sit to stand from bed Pt Will Transfer to Toilet: with modified independence;Ambulation;with DME ADL Goal: Toilet Transfer - Progress: Progressing toward goals Pt Will Perform Toileting - Hygiene: with modified independence;with adaptive equipment;Sit to stand from 3-in-1/toilet Pt Will Perform Tub/Shower Transfer: Shower transfer;with supervision;Ambulation;with DME ADL Goal: Tub/Shower Transfer - Progress: Progressing toward goals Additional ADL Goal #1: Pt verbalize/generalize 3/3 back precautions for use with all functional activities.  ADL Goal: Additional Goal #1 - Progress: Progressing toward goals  Visit Information  Last OT Received On: 02/16/12 Assistance Needed: +1 PT/OT Co-Evaluation/Treatment: Yes    Subjective Data      Prior Functioning       Cognition  Overall Cognitive Status: Appears within functional limits for tasks assessed/performed Arousal/Alertness: Awake/alert Orientation Level: Appears intact for tasks assessed Behavior During Session: [facility] for tasks performed    Mobility  Shoulder Instructions Bed Mobility Bed Mobility: Rolling Left;Left Sidelying to Sit Rolling Left: 5: Supervision Left Sidelying to Sit: 5: Supervision;HOB flat Sitting - Scoot to Edge of Bed: 5: Supervision Details for Bed Mobility Assistance: Removed bed rail and made HOB flat.  Pt used good technique. Transfers Transfers: Sit to Stand;Stand to Sit Sit to Stand: 5: Supervision;With upper extremity assist;From bed Stand to Sit: 5: Supervision;With upper extremity assist;To chair/3-in-1       Exercises      Balance     End of Session OT - End of Session Equipment Utilized During Treatment: Gait belt;Back brace Activity Tolerance: Patient tolerated treatment well Patient left: in  chair;with call bell/phone within reach  GO     Marieelena Bartko,  [place] [facility] 02/16/2012, 10:02 AM [phone]

## 2012-02-17 LAB — GLUCOSE, CAPILLARY: Glucose-Capillary: 110 mg/dL — ABNORMAL HIGH (ref 70–99)

## 2012-02-17 MED ORDER — PREGABALIN 50 MG PO CAPS: 50.0000 mg | ORAL_CAPSULE | Freq: Every day | ORAL | Status: DC

## 2012-02-17 MED ORDER — OXYCODONE-ACETAMINOPHEN 5-325 MG PO TABS: 1.0000 | ORAL_TABLET | ORAL | Status: DC | PRN

## 2012-02-17 MED ORDER — TAMSULOSIN HCL 0.4 MG PO CAPS: 0.4000 mg | ORAL_CAPSULE | Freq: Every day | ORAL | Status: DC

## 2012-02-17 MED ORDER — CYCLOBENZAPRINE HCL 10 MG PO TABS: 10.0000 mg | ORAL_TABLET | Freq: Three times a day (TID) | ORAL | Status: DC | PRN

## 2012-02-17 MED ORDER — PREGABALIN 50 MG PO CAPS
50.0000 mg | ORAL_CAPSULE | Freq: Every day | ORAL | Status: DC
  Administered 2012-02-17 – 2012-02-21 (×5): 50 mg via ORAL
  Filled 2012-02-17 (×5): qty 1

## 2012-02-17 NOTE — Discharge Summary (Addendum)
, Physician Discharge Summary  Patient ID: Kim Mcgee MRN: [id number] DOB/AGE: 12-05-1940 71 y.o.  Admit date: 02/07/2012 Discharge date: 02/17/2012  Admission Diagnoses:Lumbar stenosis, Lumbar radiculopathy, Lumbago   Discharge Diagnoses: Lumbar stenosis, Lumbar radiculopathy, Lumbago,  UTI , urinary retention,   Active Problems:  Shortness of breath  Spinal stenosis  Hypoxemia  H/O laminectomy  GERD (gastroesophageal reflux disease)  Vocal cord dysfunction  Chest pain   Discharged Condition: good  Hospital Course: pt admited and underwent surgery below -  Post op made progress - had some SOB associated with chronic dysphagia problems and was transferred to ICU for observatin - pt did well and returned to the floor - worked with PT/Ot -  Had urinary retention and foley replaced - and UTI found and pt placed on abx -   Then had R leg swelling - started on iv heparin for presumed DVTY - but next day venous dopplers negative and iv heparin stopped - pt continued to progress -  Started on flomax and foley removed.  Will be d/c when voiding  Consults: pulmonary/intensive care  Significant Diagnostic Studies: venous dopplers  Treatments: surgery: Decompressive laminectomy decompressing the L2, L3, L4,L5 roots (4L) , PLIF L3-4, L4-5, interbody cages L3-4, L4-5 , Segmented pedicle screw fixation L2-L5 , Posterior lateral fusion L2-3, L3-4, L4-5 , auutograft , allograft , bone marrow aspirate   Discharge Exam: Blood pressure 117/60, pulse 68, temperature 97.9 F (36.6 C), temperature source Oral, resp. rate 20, height 5\' 2"  (1.575 m), weight 89.1 kg (196 lb 6.9 oz), SpO2 95.00%. Wound:c/d/i  - motor intact - derease sensation right ant thigh  Disposition: home     Medication List     As of 02/23/2012  7:58 AM    STOP taking these medications         naproxen sodium 220 MG tablet   Commonly known as: ANAPROX      TAKE these medications         albuterol 108 (90 BASE)  MCG/ACT inhaler   Commonly known as: PROVENTIL HFA;VENTOLIN HFA   Inhale 2 puffs into the lungs every 6 (six) hours as needed. For shortness of breath      bethanechol 25 MG tablet   Commonly known as: URECHOLINE   Take 1 tablet (25 mg total) by mouth 3 (three) times daily.      cyclobenzaprine 10 MG tablet   Commonly known as: FLEXERIL   Take 1 tablet (10 mg total) by mouth 3 (three) times daily as needed for muscle spasms. For pain      digoxin 0.125 MG tablet   Commonly known as: LANOXIN   Take 0.125 mg by mouth daily after breakfast.      Fluticasone-Salmeterol 500-50 MCG/DOSE Aepb   Commonly known as: ADVAIR   Inhale 1 puff into the lungs 2 (two) times daily.      levothyroxine 125 MCG tablet   Commonly known as: SYNTHROID, LEVOTHROID   Take 125 mcg by mouth daily before breakfast.      lovastatin 20 MG tablet   Commonly known as: MEVACOR   Take 20 mg by mouth at bedtime.      metoprolol succinate 25 MG 24 hr tablet   Commonly known as: TOPROL-XL   Take 12.5 mg by mouth daily after breakfast.      omeprazole 20 MG capsule   Commonly known as: PRILOSEC   Take 20 mg by mouth daily with breakfast.      oxazepam 10  MG capsule   Commonly known as: SERAX   Take 10 mg by mouth at bedtime as needed.      oxyCODONE-acetaminophen 5-325 MG per tablet   Commonly known as: PERCOCET/ROXICET   Take 1-2 tablets by mouth every 4 (four) hours as needed.      polyethylene glycol packet   Commonly known as: MIRALAX / GLYCOLAX   Take 17 g by mouth daily.                  pregabalin 75 MG capsule   Commonly known as: LYRICA   Take 1 capsule (75 mg total) by mouth 2 (two) times daily.      Tamsulosin HCl 0.4 MG Caps   Commonly known as: FLOMAX   Take 1 capsule (0.4 mg total) by mouth daily.      traMADol 50 MG tablet   Commonly known as: ULTRAM   Take 50 mg by mouth every 6 (six) hours as needed. For pain      verapamil 240 MG CR tablet   Commonly known as: CALAN-SR    Take 240 mg by mouth daily with breakfast.        addendum: -  Pt had increased pain back,right buttockn  - xrays done - look great  - MRI done - significant improvement from pre-op  - there is post op seroma, but no thecal sac compression Urology saw pt and added urecholine and rec I&O caths Also increased lyrica - Pt improving - We consulted rehab - and pt hopefully will be d/c to rehab  Signed: Clydene Fake, MD 02/17/2012, 10:41 AM

## 2012-02-17 NOTE — Progress Notes (Signed)
Physical Therapy Treatment Patient Details Name: Kim Mcgee MRN: [id number] DOB: 1940/11/26 Today's Date: 02/17/2012 Time: 4098-1191 PT Time Calculation (min): 20 min  PT Assessment / Plan / Recommendation Comments on Treatment Session  Patient progressing towards goals. Ambulating in hall with husband    Follow Up Recommendations  Home health PT    Barriers to Discharge        Equipment Recommendations  Rolling walker with 5" wheels;3 in 1 bedside comode    Recommendations for Other Services    Frequency Min 5X/week   Plan Discharge plan remains appropriate;Frequency remains appropriate    Precautions / Restrictions Precautions Precautions: Back Precaution Comments: Patient able to recall all preacutions Required Braces or Orthoses: Spinal Brace Spinal Brace: Thoracolumbosacral orthotic;Applied in sitting position   Pertinent Vitals/Pain     Mobility  Transfers Sit to Stand: 6: Modified independent (Device/Increase time) Stand to Sit: 6: Modified independent (Device/Increase time) Ambulation/Gait Ambulation/Gait Assistance: 5: Supervision Ambulation Distance (Feet): 400 Feet Assistive device: Rolling walker Ambulation/Gait Assistance Details: Cues to stand upright and closer to RW Gait Pattern: Step-through pattern;Decreased stride length;Trunk flexed Stairs Assistance: 4: Min guard Stairs Assistance Details (indicate cue type and reason): Cues for technique Stair Management Technique: Two rails;Alternating pattern Number of Stairs: 3     Exercises     PT Diagnosis:    PT Problem List:   PT Treatment Interventions:     PT Goals Acute Rehab PT Goals PT Goal: Supine/Side to Sit - Progress: Met PT Goal: Sit to Stand - Progress: Met PT Goal: Stand to Sit - Progress: Met PT Goal: Ambulate - Progress: Met PT Goal: Up/Down Stairs - Progress: Progressing toward goal  Visit Information  Last PT Received On: 02/17/12 Assistance Needed: +1    Subjective Data     Cognition  Overall Cognitive Status: Appears within functional limits for tasks assessed/performed Arousal/Alertness: Awake/alert Orientation Level: Appears intact for tasks assessed Behavior During Session: [facility] for tasks performed    Balance     End of Session PT - End of Session Equipment Utilized During Treatment: Gait belt Activity Tolerance: Patient tolerated treatment well Patient left: in chair;with call bell/phone within reach Nurse Communication: Mobility status   GP     Fredrich Birks 02/17/2012, 10:00 AM  02/17/2012 Fredrich Birks PTA [phone] pager [phone] office

## 2012-02-17 NOTE — Progress Notes (Signed)
Doing well. C/o appropriate incisional soreness. Some right hip/back pain - some thigh numbness Foley removed - has not voided yet Amb well  Temp:  [97.6 F (36.4 C)-98.1 F (36.7 C)] 97.9 F (36.6 C) (09/19 1000) Pulse Rate:  [68-93] 68  (09/19 1007) Resp:  [16-20] 20  (09/19 1000) BP: (95-158)/(47-61) 117/60 mmHg (09/19 1000) SpO2:  [90 %-99 %] 95 % (09/19 1000) Good strength  Incision CDI  Plan: D/c when voiding Cont to increase activity

## 2012-02-18 LAB — BASIC METABOLIC PANEL
CO2: 27 mEq/L (ref 19–32)
GFR calc non Af Amer: 86 mL/min — ABNORMAL LOW (ref >90)
Glucose, Bld: 126 mg/dL — ABNORMAL HIGH (ref 70–99)
Potassium: 4.2 mEq/L (ref 3.5–5.1)
Sodium: 138 mEq/L (ref 135–145)

## 2012-02-18 LAB — CBC WITH DIFFERENTIAL/PLATELET
Basophils Absolute: 0 10*3/uL (ref 0.0–0.1)
Eosinophils Absolute: 0.2 10*3/uL (ref 0.0–0.7)
Lymphocytes Relative: 30 % (ref 12–46)
Lymphs Abs: 2.2 10*3/uL (ref 0.7–4.0)
Neutrophils Relative %: 58 % (ref 43–77)
Platelets: 448 10*3/uL — ABNORMAL HIGH (ref 150–400)
RBC: 3.93 MIL/uL (ref 3.87–5.11)
WBC: 7.4 10*3/uL (ref 4.0–10.5)

## 2012-02-18 NOTE — Progress Notes (Signed)
Bladder distended, pre and post void residual showed 600 and 75 respectively; in and out cath done showed 600 clear yellow urine; every 6 hours PRN in and out off schedule per patient request due to adequate  intermittent urine output.

## 2012-02-18 NOTE — Progress Notes (Signed)
Subjective: Patient reports Continued difficulty with voiding such that Foley catheter was placed, episodes of lightheadedness and dizziness when standing for a few minutes.  Objective: Vital signs in last 24 hours: Temp:  [97.4 F (36.3 C)-98 F (36.7 C)] 97.7 F (36.5 C) (09/20 1027) Pulse Rate:  [80-94] 88  (09/20 1027) Resp:  [16-20] 18  (09/20 1027) BP: (102-148)/(43-56) 123/56 mmHg (09/20 1027) SpO2:  [87 %-98 %] 98 % (09/20 1027)  Intake/Output from previous day: 09/19 0701 - 09/20 0700 In: 715 [P.O.:715] Out: 3337 [Urine:3337] Intake/Output this shift:    Incision is clean and dry vital signs are currently stable operation is in bed motor function is good in lower extremities however patient now has Foley catheter.  Lab Results: No results found for this basename: WBC:2,HGB:2,HCT:2,PLT:2 in the last 72 hours BMET No results found for this basename: NA:2,K:2,CL:2,CO2:2,GLUCOSE:2,BUN:2,CREATININE:2,CALCIUM:2 in the last 72 hours  Studies/Results: No results found.  Assessment/Plan: Episodes of hypotension and vagal effects when standing likely due to Flomax patient now has Foley catheter and therefore I will stop the Flomax. Discussed possibly the patient may need to be discharged with Foley catheter in place.  LOS: 11 days   recheck CBC in be met if patient is feeling better consider discharge in morning   Geonna Lockyer J 02/18/2012, 12:57 PM

## 2012-02-18 NOTE — Progress Notes (Signed)
RN called to room.Pt complaining of dizziness and nausea, needed help back to bed (was previously independent). Stated had a dizzy episode last night also. md notifed awaiting call back.

## 2012-02-18 NOTE — Progress Notes (Signed)
PT Cancellation Note  Treatment cancelled today due to medical issues with patient which prohibited therapy.  Pt having episodes of dizziness with OOB.  Has ambulated earlier with husband.  Declined PTx2 secondary to dizziness.  MD feels it is med related.  Will continue to follow as status allows.  Newell Coral 02/18/2012, 2:29 PM  Newell Coral, PTA Acute Rehab [phone] (office)

## 2012-02-18 NOTE — Progress Notes (Signed)
Pt able to void only small amounts,<259ml. Bladder scan performed showing 558 ml. Two in and out catheters performed. According to policy foley reinserted. 600 ml drained.

## 2012-02-19 ENCOUNTER — Inpatient Hospital Stay (HOSPITAL_COMMUNITY): Payer: Medicare Other

## 2012-02-19 MED ORDER — MAGNESIUM CITRATE PO SOLN
1.0000 | Freq: Once | ORAL | Status: AC
  Administered 2012-02-19: 1 via ORAL
  Filled 2012-02-19: qty 296

## 2012-02-19 NOTE — Progress Notes (Signed)
Subjective: Patient reports Very sleepy this morning apparently patient had some confusional episode at night  Objective: Vital signs in last 24 hours: Temp:  [97.5 F (36.4 C)-98.1 F (36.7 C)] 97.6 F (36.4 C) (09/21 0643) Pulse Rate:  [77-88] 77  (09/21 0643) Resp:  [16-20] 16  (09/21 0643) BP: (120-153)/(48-63) 149/60 mmHg (09/21 0643) SpO2:  [92 %-99 %] 99 % (09/21 0643)  Intake/Output from previous day: 09/20 0701 - 09/21 0700 In: -  Out: 1300 [Urine:1300] Intake/Output this shift:    Arousable to voice but prefers to be left alone at this time  Lab Results:  Basename 02/18/12 1306  WBC 7.4  HGB 11.0*  HCT 34.4*  PLT 448*   BMET  Basename 02/18/12 1306  NA 138  K 4.2  CL 100  CO2 27  GLUCOSE 126*  BUN 7  CREATININE 0.68  CALCIUM 9.4    Studies/Results: No results found.  Assessment/Plan: Stable for now husband reported episode of vagal reaction yesterday evening.  LOS: 12 days  Will observe her this morning Foley remains in place patient is stable may consider discharge this afternoon   Savita Runner J 02/19/2012, 8:34 AM

## 2012-02-20 LAB — URINALYSIS, ROUTINE W REFLEX MICROSCOPIC
Specific Gravity, Urine: 1.01 (ref 1.005–1.030)
Urobilinogen, UA: 0.2 mg/dL (ref 0.0–1.0)

## 2012-02-20 LAB — URINE MICROSCOPIC-ADD ON

## 2012-02-20 MED ORDER — POLYETHYLENE GLYCOL 3350 17 G PO PACK
17.0000 g | PACK | Freq: Every day | ORAL | Status: DC
  Administered 2012-02-20 – 2012-02-25 (×6): 17 g via ORAL
  Filled 2012-02-20 (×6): qty 1

## 2012-02-20 NOTE — Progress Notes (Addendum)
Foley cath. Removed at 0845. Bladder scan at 1200 only 70ml. Will continue to monitor. Possible D/C home today if able to void. Patient was very active yesterday; walked the unit four times; good results post laxatives from her constipation.  Now c/o increased back spasms and some radiation into her right hip and upper leg. C/o increased Weakness getting up and down; "can't walk like yesterday".  Patient has been very careful; following back precautions and application of her brace.  Patient was able to void this afternoon at 1400. Measured . Post bladder scan readings ranged from 80-242ml. Patient still very uncomfortable with pain and back spasms.  Dr. Jordan Likes was notified. Repeat specimens sent to lab. Patient instructed to rest; prn meds. Given.  Dr. Jordan Likes recalled at 1900 with same report of increased weakness in Gait OOB; leg strength unchanged upon assessment. Rest the night; Dr. Phoebe Perch to see in am.

## 2012-02-20 NOTE — Progress Notes (Signed)
Patient ID: Kim Mcgee, female   DOB: [birth year], 71 y.o.   MRN: [id number] Patient feels better today after having had bowel movement and now off of Flomax for one full day. Nausea is essentially gone. Foley catheter is removed this morning.

## 2012-02-21 ENCOUNTER — Inpatient Hospital Stay (HOSPITAL_COMMUNITY): Payer: Medicare Other

## 2012-02-21 MED ORDER — TAMSULOSIN HCL 0.4 MG PO CAPS
0.4000 mg | ORAL_CAPSULE | Freq: Every day | ORAL | Status: DC
  Administered 2012-02-21 – 2012-02-25 (×5): 0.4 mg via ORAL
  Filled 2012-02-21 (×5): qty 1

## 2012-02-21 NOTE — Progress Notes (Signed)
Physical medicine and rehabilitation consult requested. Patient is status post lumbar 2, 3, 4, 5 decompression with PLIF 02/17/2012. Patient had been progressing nicely and was supervision ambulate 400 feet as of 02/17/2012 with a rolling walker and anticipation of discharge home. Noted today 02/21/2012 patient with increased muscle spasms mobility limited with reports of increased pain. MRI lumbar spine as well as lumbar spine films are pending today 02/21/2012. Will await formal x-ray reports and findings and then return for followup formal rehabilitation consult if needed after followup by physical therapy to determine level of care. All issues in regards of this were discussed with patient and husband and they fully agree to hold on full rehabilitation consult until x-rays completed.

## 2012-02-21 NOTE — Progress Notes (Signed)
Physical Therapy Treatment Patient Details Name: Kim Mcgee MRN: [id number] DOB: 1940/06/30 Today's Date: 02/21/2012 Time: 1324-4010 PT Time Calculation (min): 24 min  PT Assessment / Plan / Recommendation Comments on Treatment Session  Pt with significant decline in functional mobility today with reports of increased pain (reports as high as 9/10) and spasms in her lower back and right hip with standing. Pt only able to perform stand pivot transfer to the recliner today because of fatigue and pain (whereas 2 days ago pt walking the entire unit x4). RN made aware of my concerns and the need for 2 person assist to transfer back to bed. Pt very concerned about spasms and her limited mobility. Goals remain appropriate and time to achieve has been updated.     Follow Up Recommendations  Home health PT;Supervision for mobility/OOB    Barriers to Discharge        Equipment Recommendations  Rolling walker with 5" wheels;3 in 1 bedside comode    Recommendations for Other Services    Frequency Min 5X/week   Plan      Precautions / Restrictions Precautions Precautions: Back   Pertinent Vitals/Pain Pt c/o 9/10 pain with all standing today, 6/10 sitting EOB.     Mobility  Bed Mobility Rolling Left: 4: Min guard Left Sidelying to Sit: 4: Min assist Sitting - Scoot to Edge of Bed: 4: Min assist Details for Bed Mobility Assistance: min tactile cues for safe technique (rolling, decreased twisting), use of pad for reciprocal scooting Transfers Transfers: Sit to Stand;Stand to Sit;Stand Pivot Transfers Sit to Stand: 3: Mod assist;With upper extremity assist;From bed Stand to Sit: 4: Min assist;With armrests;To chair/3-in-1;With upper extremity assist Stand Pivot Transfers: 4: Min assist Details for Transfer Assistance: facilitation at sacrum/hips for follow through to full standing, difficulty bring arms up to RW 2/2 pain and c/o spasms; sit<>stand x2 as pt with increase pain needing seated  rest break in prep for SPT to chair; very slow painful and weak movement today, legs appear to have increased shakiness during transfer and she could not tolerate standing for long periods of time Ambulation/Gait Ambulation/Gait Assistance: Not tested (comment) (pt could not tolerate ambulation today)    Exercises General Exercises - Lower Extremity Ankle Circles/Pumps: AROM;Both;20 reps;Supine Quad Sets: AROM;Both;5 reps;Supine     PT Goals Acute Rehab PT Goals PT Goal Formulation: With patient Time For Goal Achievement: 02/28/12 Potential to Achieve Goals: Good PT Goal: Rolling Supine to Left Side - Progress: Progressing toward goal PT Goal: Supine/Side to Sit - Progress: Progressing toward goal PT Goal: Sit to Stand - Progress: Progressing toward goal PT Goal: Stand to Sit - Progress: Progressing toward goal PT Goal: Ambulate - Progress: Not progressing PT Goal: Up/Down Stairs - Progress: Not progressing  Visit Information  Last PT Received On: 02/21/12 Assistance Needed: +2 (+2 to walk)    Subjective Data  Subjective: I just don't know what happened. I am so much more painful and weak.    Cognition  Overall Cognitive Status: Appears within functional limits for tasks assessed/performed Arousal/Alertness: Awake/alert Orientation Level: Appears intact for tasks assessed Behavior During Session: [facility] for tasks performed    Balance     End of Session PT - End of Session Equipment Utilized During Treatment: Gait belt Activity Tolerance: Patient limited by pain;Patient limited by fatigue Patient left: in chair;with call bell/phone within reach Nurse Communication: Mobility status;Other (comment) (pt's decrease in activity tolerance)   GP     WHITLOW,Akiera Allbaugh HELEN 02/21/2012, 9:57  AM

## 2012-02-21 NOTE — Progress Notes (Signed)
Subjective: Patient reports increase back and right hip pain - trouble standing/ambulating because pain - still with  Urinary retention.  Objective: Vital signs in last 24 hours: Temp:  [97.5 F (36.4 C)-98.3 F (36.8 C)] 97.6 F (36.4 C) (09/23 0600) Pulse Rate:  [72-98] 85  (09/23 0600) Resp:  [20] 20  (09/23 0600) BP: (134-154)/(61-74) 149/62 mmHg (09/23 0600) SpO2:  [92 %-98 %] 95 % (09/23 0600)  Intake/Output from previous day: 09/22 0701 - 09/23 0700 In: 180 [P.O.:180] Out: 2651 [Urine:2650; Stool:1] Intake/Output this shift:    Wound:c/d/i  Lab Results:  Basename 02/18/12 1306  WBC 7.4  HGB 11.0*  HCT 34.4*  PLT 448*   BMET  Basename 02/18/12 1306  NA 138  K 4.2  CL 100  CO2 27  GLUCOSE 126*  BUN 7  CREATININE 0.68  CALCIUM 9.4    Studies/Results: Dg Abd 2 Views  05-Mar-2012  *RADIOLOGY REPORT*  Clinical Data: Constipation  ABDOMEN - 2 VIEW  Comparison: None  Findings: There is nonspecific nonobstructive bowel gas pattern. Postsurgical changes noted lumbar spine L2-L5 level.  Some stool noted right colon transverse colon and left colon.  No free abdominal air.  IMPRESSION: Nonspecific nonobstructive bowel gas pattern.  Postsurgical changes lumbar spine.  No free abdominal air.  Some stool noted right, transverse and left colon.   Original Report Authenticated By: Natasha Mead, M.D.     Assessment/Plan: Will get xrays and MRI -   Consult rehab  LOS: 14 days     Zakhari Fogel R, MD 02/21/2012, 10:47 AM

## 2012-02-22 ENCOUNTER — Inpatient Hospital Stay (HOSPITAL_COMMUNITY): Payer: Medicare Other

## 2012-02-22 MED ORDER — GADOBENATE DIMEGLUMINE 529 MG/ML IV SOLN
18.0000 mL | Freq: Once | INTRAVENOUS | Status: AC
  Administered 2012-02-22: 18 mL via INTRAVENOUS

## 2012-02-22 MED ORDER — BETHANECHOL CHLORIDE 25 MG PO TABS
25.0000 mg | ORAL_TABLET | Freq: Three times a day (TID) | ORAL | Status: DC
  Administered 2012-02-22 – 2012-02-25 (×8): 25 mg via ORAL
  Filled 2012-02-22 (×10): qty 1

## 2012-02-22 MED ORDER — PREGABALIN 75 MG PO CAPS
75.0000 mg | ORAL_CAPSULE | Freq: Two times a day (BID) | ORAL | Status: DC
  Administered 2012-02-22 – 2012-02-25 (×6): 75 mg via ORAL
  Filled 2012-02-22 (×6): qty 1

## 2012-02-22 NOTE — Progress Notes (Signed)
Subjective: Patient reports back, Right buttock pain/spasm ,  Sl improved today  Objective: Vital signs in last 24 hours: Temp:  [97.7 F (36.5 C)-98.2 F (36.8 C)] 97.7 F (36.5 C) (09/24 1000) Pulse Rate:  [79-90] 87  (09/24 1000) Resp:  [18] 18  (09/24 1000) BP: (126-152)/(59-99) 129/66 mmHg (09/24 1000) SpO2:  [93 %-98 %] 93 % (09/24 1000)  Intake/Output from previous day: 09/23 0701 - 09/24 0700 In: 60 [P.O.:60] Out: 2650 [Urine:2650] Intake/Output this shift: Total I/O In: -  Out: 600 [Urine:600]  Wound:no drainage Neuro  - no change  Lab Results: No results found for this basename: WBC:2,HGB:2,HCT:2,PLT:2 in the last 72 hours BMET No results found for this basename: NA:2,K:2,CL:2,CO2:2,GLUCOSE:2,BUN:2,CREATININE:2,CALCIUM:2 in the last 72 hours  Studies/Results: Dg Lumbar Spine Complete  02/21/2012  *RADIOLOGY REPORT*  Clinical Data: Recent back surgery, increasing back pain.  LUMBAR SPINE - COMPLETE 4+ VIEW  Comparison: 12/13/2011 radiograph, 02/07/2012 intraoperative spot views.  Findings: Status post interbody fusion at L3-4 and L4-5, with posterior rod and bilateral pedicle screw fixation of L2-L5.  Grade 1 anterolisthesis of L4-L5 is similar to prior.  Multilevel degenerative changes.  No acute fracture or change in alignment. Hardware appears intact, without surrounding lucency.  IMPRESSION: L2-L5 PLIF. No static radiographic evidence of acute osseous change or hardware complication.   Original Report Authenticated By: Waneta Martins, M.D.    MRI - good central decompression - post -op seroma, but no thecal sac compression,  Some Right L4-5 foremenal narrowing - but better than pre-op  Assessment/Plan: Need to increase activity - no reason for any reexploration of lumbar area at this time -  Recommend rehab vs SNF placement -   LOS: 15 days     Vaneta Hammontree R, MD 02/22/2012, 3:00 PM

## 2012-02-22 NOTE — Progress Notes (Signed)
Physical Therapy Treatment Patient Details Name: Kim Mcgee MRN: [id number] DOB: 1940-08-28 Today's Date: 02/22/2012 Time: 4098-1191 PT Time Calculation (min): 37 min  PT Assessment / Plan / Recommendation Comments on Treatment Session  Cleared by surgeon for progressive mobilization following MRI. Pt still very painful with mobility today but was able to ambulate approx 18 ft with minA. Husband and wife prefer inpatient rehab prior to d/c home. Because of limited number of beds at [facility] patient and wife educated on the possibility of inpatient rehab or SNF in another location. Husband appears very stressed and I spent at least 10 minutes in the beginning of the session educating patient and family on the process of rehab. Husband still very frustrated and  would like to take patient home if she can walk to and from a bathroom OK.     Follow Up Recommendations  Inpatient Rehab    Barriers to Discharge        Equipment Recommendations  Rolling walker with 5" wheels;3 in 1 bedside comode    Recommendations for Other Services Rehab consult  Frequency     Plan Discharge plan needs to be updated;Frequency remains appropriate    Precautions / Restrictions Precautions Precautions: Fall;Back Spinal Brace: Thoracolumbosacral orthotic;Applied in sitting position       Mobility  Bed Mobility Rolling Left: 5: Supervision;With rail Left Sidelying to Sit: 5: Supervision;HOB flat Details for Bed Mobility Assistance: occasional verbal cues for technique Transfers Transfers: Sit to Stand;Stand to Sit Sit to Stand: 4: Min assist;With upper extremity assist;From bed;From elevated surface Stand to Sit: To chair/3-in-1;With upper extremity assist;With armrests Details for Transfer Assistance: cues for hand placement and facilitation for power up to stand, painful during end range of movement; uses RW to stabilize herself well but still very painful; pt began sitting prior to proper set up of  chair/backing completely up to chair and legs became shaky needing minA to control her descent and make sure she got full seat support on the chair Ambulation/Gait Ambulation/Gait Assistance: 4: Min assist Ambulation Distance (Feet): 18 Feet Assistive device: Rolling walker Ambulation/Gait Assistance Details: cues for upright posture and safe distance from RW to prevent too much bending especially when turning Gait Pattern: Step-through pattern;Trunk flexed;Lateral trunk lean to left General Gait Details: leaning away from more painful hip Stairs: No    Exercises General Exercises - Lower Extremity Ankle Circles/Pumps: AROM;Both;20 reps;Seated Short Arc Quad: AROM;Both;5 reps;Supine    PT Goals Acute Rehab PT Goals PT Goal: Rolling Supine to Left Side - Progress: Progressing toward goal PT Goal: Supine/Side to Sit - Progress: Progressing toward goal PT Goal: Sit to Stand - Progress: Progressing toward goal PT Goal: Stand to Sit - Progress: Progressing toward goal Pt will Ambulate: 51 - 150 feet;with supervision;with rolling walker PT Goal: Ambulate - Progress: Revised due to lack of progress PT Goal: Up/Down Stairs - Progress: Not progressing Additional Goals PT Goal: Additional Goal #1 - Progress: Progressing toward goal  Visit Information  Last PT Received On: 02/22/12 Assistance Needed: +1 (+2 for safety with ambulation)    Subjective Data  Subjective: If you can get me walking to and from the bathroom my husband can take me home.  Patient Stated Goal: home or inpatient rehab   Cognition  Overall Cognitive Status: Appears within functional limits for tasks assessed/performed Arousal/Alertness: Awake/alert Orientation Level: Appears intact for tasks assessed Behavior During Session: [facility] for tasks performed    Balance     End of Session PT -  End of Session Equipment Utilized During Treatment: Gait belt Activity Tolerance: Patient limited by fatigue;Patient limited by  pain Patient left: in chair;with call bell/phone within reach Nurse Communication: Mobility status   GP     [facility] HELEN 02/22/2012, 4:21 PM

## 2012-02-22 NOTE — Clinical Social Work Psychosocial (Signed)
     Clinical Social Work Department BRIEF PSYCHOSOCIAL ASSESSMENT 02/22/2012  Patient:  Kim Mcgee, Kim Mcgee     Account Number:  [id number]     Admit date:  02/07/2012  Clinical Social Worker:  Peggyann Shoals  Date/Time:  02/22/2012 03:56 PM  Referred by:  Physician  Date Referred:  02/22/2012 Referred for  SNF Placement   Other Referral:   Interview type:  Family Other interview type:    PSYCHOSOCIAL DATA Living Status:  HUSBAND Admitted from facility:   Level of care:   Primary support name:  Suzzette Righter Primary support relationship to patient:  SPOUSE Degree of support available:   supportive    CURRENT CONCERNS Current Concerns  Post-Acute Placement   Other Concerns:    SOCIAL WORK ASSESSMENT / PLAN CSW met with pt to address consult. CSW introduced herself and explained role of social work. CSW also explained the process of discharging to a SNF. Pt shared that she is interested in [facility], however there are no bed availability at this time.    CSW will inititate SNF search in [place] area, closer to home. CSW will follow up with bed offers. CSW will continue to follow.   Assessment/plan status:  Psychosocial Support/Ongoing Assessment of Needs Other assessment/ plan:   Information/referral to community resources:   SNF List    PATIENTS/FAMILYS RESPONSE TO PLAN OF CARE: Pt was alert and oriented. Pt and husband are agreeable to SNF search.

## 2012-02-22 NOTE — Progress Notes (Signed)
Will hold OT treatment today until MRI complete. RN aware.

## 2012-02-22 NOTE — Progress Notes (Signed)
PT Cancellation Note  Will hold PT treatment today until MRI complete. RN aware.  [facility] HELEN 02/22/2012, 8:56 AM Pager: [phone]

## 2012-02-22 NOTE — Clinical Social Work Placement (Addendum)
    Clinical Social Work Department CLINICAL SOCIAL WORK PLACEMENT NOTE 02/22/2012  Patient:  Kim Mcgee, Kim Mcgee  Account Number:  [id number] Admit date:  02/07/2012  Clinical Social Worker:  Peggyann Shoals  Date/time:  02/22/2012 04:05 PM  Clinical Social Work is seeking post-discharge placement for this patient at the following level of care:   SKILLED NURSING   (*CSW will update this form in Epic as items are completed)   02/22/2012  Patient/family provided with [facility] Department of Clinical Social Work's list of facilities offering this level of care within the geographic area requested by the patient (or if unable, by the patient's family).  02/22/2012  Patient/family informed of their freedom to choose among providers that offer the needed level of care, that participate in Medicare, Medicaid or managed care program needed by the patient, have an available bed and are willing to accept the patient.  02/22/2012  Patient/family informed of [facility]' ownership interest in [facility], as well as of the fact that they are under no obligation to receive care at this facility.  PASARR submitted to EDS on  PASARR number received from EDS on   FL2 transmitted to all facilities in geographic area requested by pt/family on  02/22/2012 FL2 transmitted to all facilities within larger geographic area on   Patient informed that his/her managed care company has contracts with or will negotiate with  certain facilities, including the following:     Patient/family informed of bed offers received: 02/23/2012  Patient chooses bed at [facility]   Physician recommends and patient chooses bed at  SNF    Patient to be transferred to [facility] on 9/276/2013 Patient to be transferred to facility by [facility]  The following physician request were entered in Epic:   Additional Comments: Seeking SNF placement in [place], therefore PASARR# is needed.

## 2012-02-22 NOTE — Consult Note (Signed)
Urology Consult   Physician requesting consult: Phoebe Perch   Reason for consult: Urinary retention  History of Present Illness: Kim Mcgee is a 71 y.o. female who is approximately 2 weeks out from a lumbar spine procedure. She is had an extended postoperative course, with urinary retention. She is currently on in and out catheterization, with catheterized volumes up to 1000 cc. She did have an indwelling Foley catheter for some time. She did have pyuria, on a urinalysis from 9/14 and has been on antibiotics. I do not see any culture results from that. A current urine culture is pending.  Currently, she does feel the urge to void, but has pain in her sacral area, which she feels as to the difficulty voiding. She has not had a bowel movement in approximately one week.  Prior to her surgery, for quite some time, she had incomplete emptying of her bladder. She was seen by a urologist in [place], [place] for some time for this issue. She states that she had residual urine volumes over 10 ounces. Despite that, she had no significant urologic sequelae other than one UTI approximately 6 weeks before the surgery, which was treated with oral antibiotics. She was minimally symptomatic at that time. She has been on Flomax prior to her surgery. She thinks that that may have helped her urination.  She did not have any formal urodynamic studies.  Past Medical History  Diagnosis Date  . Hypertension   . Hypothyroidism   . Anxiety   . Asthma   . Shortness of breath     /w walking long distance   . Dysrhythmia   . GERD (gastroesophageal reflux disease)   . Cancer     basal cell CA  . Arthritis     lumbar DDD  . Normal cardiac stress test   . Swallowing difficulty     being seen by ENT- had endoscopy- told that its probably related to past neck surgeries     Past Surgical History  Procedure Date  . Total thyroidectomy     1990's  . Appendectomy   . Tonsillectomy   . Cardiac catheterization    "long time ago"   . Tubal ligation   . Cervical fusion   . Eye surgery     L eye- cataract removed  - /w IOL      Current Hospital Medications: Scheduled Meds:   . digoxin  0.125 mg Oral QPC breakfast  . docusate sodium  100 mg Oral BID  . gadobenate dimeglumine  18 mL Intravenous Once  . heparin subcutaneous  5,000 Units Subcutaneous Q8H  . levothyroxine  125 mcg Oral QAC breakfast  . metoprolol succinate  12.5 mg Oral QPC breakfast  . pantoprazole  40 mg Oral BID AC  . polyethylene glycol  17 g Oral Daily  . pregabalin  75 mg Oral BID  . simvastatin  10 mg Oral q1800  . Tamsulosin HCl  0.4 mg Oral Daily  . verapamil  240 mg Oral Q breakfast  . DISCONTD: pregabalin  50 mg Oral QHS  . DISCONTD: sulfamethoxazole-trimethoprim  1 tablet Oral Q12H   Continuous Infusions:  PRN Meds:.acetaminophen, acetaminophen, albuterol, alum & mag hydroxide-simeth, bisacodyl, bisacodyl, famotidine, LORazepam, magnesium hydroxide, methocarbamol, morphine injection, ondansetron (ZOFRAN) IV, ondansetron (ZOFRAN) IV, oxyCODONE-acetaminophen, promethazine, promethazine, sodium phosphate, zolpidem  Allergies:  Allergies  Allergen Reactions  . Macrodantin (Nitrofurantoin)     Chest pain  . Penicillins Rash  . Tape Rash    History reviewed. No pertinent  family history.  Social History:  reports that she has never smoked. She does not have any smokeless tobacco history on file. She reports that she does not drink alcohol or use illicit drugs.  ROS: A complete review of systems was performed.  All systems are negative except for pertinent findings as noted.  Physical Exam:  Vital signs in last 24 hours: Temp:  [97.7 F (36.5 C)-98.2 F (36.8 C)] 97.7 F (36.5 C) (09/24 1000) Pulse Rate:  [79-90] 87  (09/24 1000) Resp:  [18] 18  (09/24 1000) BP: (126-152)/(59-99) 129/66 mmHg (09/24 1000) SpO2:  [93 %-98 %] 93 % (09/24 1000) General:  Alert and oriented, No acute distress HEENT:  Normocephalic, atraumatic Neck: No JVD or lymphadenopathy   Laboratory Data:  No results found for this basename: WBC:5,HGB:5,HCT:5,PLT:5 in the last 72 hours  No results found for this basename: NA:5,K:5,CL:5,CO3:5,GLUCOSE:5,BUN:5,CALCIUM:5,CREATININE:5 in the last 72 hours   No results found for this or any previous visit (from the past 24 hour(s)). Recent Results (from the past 240 hour(s))  URINE CULTURE     Status: Normal (Preliminary result)   Collection Time   02/20/12  3:37 PM      Component Value Range Status Comment   Specimen Description URINE, CLEAN CATCH   Final    Special Requests PT ON BACTRIUM   Final    Culture  Setup Time 02/20/2012 20:12   Final    Colony Count PENDING   Incomplete    Culture Culture reincubated for better growth   Final    Report Status PENDING   Incomplete     Renal Function:  [facility] 02/18/12 1306  CREATININE 0.68   Estimated Creatinine Clearance: 66.9 ml/min (by C-G formula based on Cr of 0.68).  Radiologic Imaging:   Impression/Assessment:  Urinary retention. This is multifactorial. She did have problems with emptying completely prior to her surgery, and had been seen by a urologist in the past. She is constipated, and pain, and is bedbound/weak. All of these factors, I think, are leading to her current problem.  Plan:  1. I have reassured her that I think she will eventually get back to her preoperative status  2. Regarding her constipation, I would strongly suggest she get on a regular bowel regimen.  3. I will and Urecholine, a cholinergic medicine, to her regimen. This may help increased bladder tone and improve bladder emptying. I would recommend sending her home on that. I would also continue the Flomax for now.  4. Regarding her bladder drainage, she has learned intermittent catheterization in the past. I would recommend that the nursing staff worked with her, teaching her to do that. That can be done by her 4 times a day.  She can go home on that. If unsuccessful, she will need to go home with Foley catheter  5. I will sign off-if further help is needed, please contact me. Pager [phone]  5. Following discharge, she should followup with Dr. Gershon Mussel, her urologist in [place].

## 2012-02-22 NOTE — Progress Notes (Signed)
patient complained of being feeling nauseous, had one episode of vomiting, antiemetic given as ordered. Will continue to monitor.

## 2012-02-23 LAB — URINE CULTURE: Colony Count: 60000

## 2012-02-23 MED ORDER — BETHANECHOL CHLORIDE 25 MG PO TABS: 25.0000 mg | ORAL_TABLET | Freq: Three times a day (TID) | ORAL | Status: DC

## 2012-02-23 MED ORDER — POLYETHYLENE GLYCOL 3350 17 G PO PACK: 17.0000 g | PACK | Freq: Every day | ORAL | Status: DC

## 2012-02-23 MED ORDER — PREGABALIN 75 MG PO CAPS: 75.0000 mg | ORAL_CAPSULE | Freq: Two times a day (BID) | ORAL | Status: DC

## 2012-02-23 MED ORDER — TAMSULOSIN HCL 0.4 MG PO CAPS: 0.4000 mg | ORAL_CAPSULE | Freq: Every day | ORAL | Status: DC

## 2012-02-23 NOTE — Progress Notes (Signed)
Physical medicine rehabilitation consult followup from prior progress note 02/21/2012. Await neurosurgery comments and recommendations in regards to MRI lumbar spine 02/22/2012 and resuming physical therapy to assess progress and questionable need for inpatient rehabilitation services.

## 2012-02-23 NOTE — Progress Notes (Signed)
Pt bladdered scanned 9am, 402 was read. 300 drained off pt. Pt now voiding. Under no s/s distress.

## 2012-02-23 NOTE — Progress Notes (Signed)
Occupational Therapy Treatment Patient Details Name: Kim Mcgee MRN: [id number] DOB: [birth year] Today's Date: 02/23/2012 Time: 6440-3474 OT Time Calculation (min): 28 min  OT Assessment / Plan / Recommendation Comments on Treatment Session pt did better today according to PT who also saw her yesterday    Follow Up Recommendations  Home health OT;Supervision/Assistance - 24 hour       Equipment Recommendations  3 in 1 bedside comode             Precautions / Restrictions Precautions Precautions: Fall;Back Spinal Brace: Thoracolumbosacral orthotic;Applied in sitting position       ADL  Toilet Transfer: Performed;Moderate assistance Toilet Transfer Equipment: Comfort height toilet Where Assessed - Toileting Clothing Manipulation and Hygiene: Standing Tub/Shower Transfer: +1 Total assistance;Other (comment) (pt has been educated in toilet aid and says husband will buy) ADL Comments: see PT note also      OT Goals ADL Goals ADL Goal: Toilet Transfer - Progress: Progressing toward goals ADL Goal: Toileting - Hygiene - Progress: Progressing toward goals  Visit Information  Last OT Received On: 02/23/12 Assistance Needed: +2 (chair follow with ambulation) PT/OT Co-Evaluation/Treatment: Yes    Subjective Data  Subjective: let me stand and then i can decide what goal will be      Cognition  Overall Cognitive Status: Appears within functional limits for tasks assessed/performed Arousal/Alertness: Awake/alert Orientation Level: Appears intact for tasks assessed Behavior During Session: [facility] for tasks performed    Mobility  Shoulder Instructions Bed Mobility Bed Mobility: Sitting - Scoot to Edge of Bed Rolling Left: 6: Modified independent (Device/Increase time);With rail Right Sidelying to Sit: 5: Supervision;HOB flat;With rails Sitting - Scoot to Edge of Bed: 4: Min assist Details for Bed Mobility Assistance: slow transition from sidelying ->sit secondary to pain  and weaknes, use of pad for reciprocal scooting EOB Transfers Transfers: Sit to Stand;Stand to Sit Sit to Stand: 3: Mod assist;With upper extremity assist;From bed;From toilet Stand to Sit: To toilet;To chair/3-in-1;With upper extremity assist Details for Transfer Assistance: cues for safe hand placement, mod-min facilitation for follow through to stand; facilitation at knees and trunk for trunk/knee extension and tall posture, slow and difficult transitioning hands to RW once standing secondary to pain and lower extremity weakness             End of Session OT - End of Session Equipment Utilized During Treatment: Gait belt Activity Tolerance: Patient limited by pain Patient left: in chair;with call bell/phone within reach       [facility], Karin Golden D 02/23/2012, 11:42 AM

## 2012-02-23 NOTE — Progress Notes (Signed)
I met with patient and her spouse at bedside. Patient would benefit from an inpt rehab stay pending bed availability over the next 24 to 48 hrs. We can clarify that by tomorrow. They are aware that SNF rehab would be an option if bed not available and I have discussed with SW. We will follow up in the morning. Please call [phone] with questions.

## 2012-02-23 NOTE — Progress Notes (Signed)
Physical Therapy Treatment Patient Details Name: Kim Mcgee MRN: [id number] DOB: 1940-07-23 Today's Date: 02/23/2012 Time: 4098-1191 PT Time Calculation (min): 30 min  PT Assessment / Plan / Recommendation Comments on Treatment Session  Kim Mcgee is making slow progress but able to ambulate more today. Encouraged her to ambulate with nursing staff another time today. Still needing increased assist for sit->stand secondary to pain and weakness. Continue to rec. rehab prior to d/c home.     Follow Up Recommendations  Inpatient Rehab    Barriers to Discharge        Equipment Recommendations  Rolling walker with 5" wheels;3 in 1 bedside comode    Recommendations for Other Services    Frequency     Plan Discharge plan remains appropriate;Frequency remains appropriate    Precautions / Restrictions Precautions Precautions: Fall;Back Spinal Brace: Thoracolumbosacral orthotic;Applied in sitting position       Mobility  Bed Mobility Bed Mobility: Sitting - Scoot to Edge of Bed Rolling Left: 6: Modified independent (Device/Increase time);With rail Right Sidelying to Sit: 5: Supervision;HOB flat;With rails Sitting - Scoot to Edge of Bed: 4: Min assist Details for Bed Mobility Assistance: slow transition from sidelying ->sit secondary to pain and weaknes, use of pad for reciprocal scooting EOB Transfers Transfers: Sit to Stand;Stand to Sit Sit to Stand: 3: Mod assist;With upper extremity assist;From bed;From toilet Stand to Sit: To toilet;To chair/3-in-1;With upper extremity assist Details for Transfer Assistance: cues for safe hand placement, mod-min facilitation for follow through to stand; facilitation at knees and trunk for trunk/knee extension and tall posture, slow and difficult transitioning hands to RW once standing secondary to pain and lower extremity weakness Ambulation/Gait Ambulation Distance (Feet): 55 Feet Assistive device: Rolling walker Ambulation/Gait Assistance  Details: min tactile cues for upright posture and safe technique with RW, slow easy breaths and relaxation of upper extremities, pt easily fatigued and gets very hot with increased diaphoresis, chair to follow for easy sit when needed Gait Pattern: Trunk flexed    Exercises       PT Goals Acute Rehab PT Goals PT Goal: Rolling Supine to Left Side - Progress: Met PT Goal: Supine/Side to Sit - Progress: Progressing toward goal PT Goal: Sit to Stand - Progress: Progressing toward goal PT Goal: Stand to Sit - Progress: Progressing toward goal PT Goal: Ambulate - Progress: Progressing toward goal  Visit Information  Last PT Received On: 02/23/12 Assistance Needed: +2 (chair follow with ambulation)    Subjective Data  Subjective: I feel  whole lot better than yesterday but still hurting.    Cognition  Overall Cognitive Status: Appears within functional limits for tasks assessed/performed Arousal/Alertness: Awake/alert Orientation Level: Appears intact for tasks assessed Behavior During Session: [facility] for tasks performed    Balance     End of Session PT - End of Session Equipment Utilized During Treatment: Gait belt Activity Tolerance: Patient tolerated treatment well;Patient limited by fatigue;Patient limited by pain Patient left: in chair;with call bell/phone within reach Nurse Communication: Mobility status   GP     [facility] HELEN 02/23/2012, 11:40 AM

## 2012-02-23 NOTE — Progress Notes (Signed)
Doing well. Improved back/hip pain  Starting to ambulate again -  +BM today  Temp:  [97.6 F (36.4 C)-98 F (36.7 C)] 97.6 F (36.4 C) (09/25 0600) Pulse Rate:  [85-89] 86  (09/25 0600) Resp:  [18] 18  (09/25 0600) BP: (107-159)/(48-71) 159/68 mmHg (09/25 0600) SpO2:  [93 %-96 %] 95 % (09/25 0600) Good strength and sensation Incision CDI  Plan: Await rehab - ok to d/c to rehab when/if accepted Appreciate urology input

## 2012-02-23 NOTE — Consult Note (Signed)
Physical Medicine and Rehabilitation Consult Reason for Consult: Lumbar stenosis with radiculopathy Referring Physician: Dr. Phoebe Perch   HPI: Kim Mcgee is a 71 y.o. right-handed female admitted 02/07/2012 with ongoing low back pain radiating to the lower extremities as well his urinary retention and no change with conservative care including epidural steroid injections. X-rays and imaging with lumbar 2, 3, 4 stenosis with radiculopathy. Underwent decompressive laminectomy, decompressing lumbar 2, 3, 4 and lumbar 5 roots with PLIF lumbar 3-4, L4-5 02/07/2012 per Dr. Phoebe Perch. Patient fitted with a thoraco lumbosacral orthotic to be applied in the sitting position. Hospital course complicated by urinary retention with consult to urology services with Urecholine added. Plan Foley catheter tube versus intermittent catheterizations. Bouts of constipation resolved with laxative assistance. Patient with increased back pain 02/21/2012 with lumbar spine films showing good decompression. MRI lumbar spine completed 9/24/ 13 with good central decompression noted postoperative seroma but no thecal sac compression there was some right L4-5 foraminal narrowing but better than preoperatively and no further surgical intervention was recommended at this time. Therapies have been ongoing with mobility variable. PT has recommended physical medicine rehabilitation service consult to consider inpatient rehabilitation services   Review of Systems  Gastrointestinal: Positive for constipation.  Genitourinary:       Urinary retention  Musculoskeletal: Positive for myalgias and back pain.  Psychiatric/Behavioral:       Anxiety  All other systems reviewed and are negative.   Past Medical History  Diagnosis Date  . Hypertension   . Hypothyroidism   . Anxiety   . Asthma   . Shortness of breath     /w walking long distance   . Dysrhythmia   . GERD (gastroesophageal reflux disease)   . Cancer     basal cell CA  .  Arthritis     lumbar DDD  . Normal cardiac stress test   . Swallowing difficulty     being seen by ENT- had endoscopy- told that its probably related to past neck surgeries    Past Surgical History  Procedure Date  . Total thyroidectomy     1990's  . Appendectomy   . Tonsillectomy   . Cardiac catheterization     "long time ago"   . Tubal ligation   . Cervical fusion   . Eye surgery     L eye- cataract removed  - /w IOL    History reviewed. No pertinent family history. Social History:  reports that she has never smoked. She does not have any smokeless tobacco history on file. She reports that she does not drink alcohol or use illicit drugs. Allergies:  Allergies  Allergen Reactions  . Macrodantin (Nitrofurantoin)     Chest pain  . Penicillins Rash  . Tape Rash   Medications Prior to Admission  Medication Sig Dispense Refill  . albuterol (PROVENTIL HFA;VENTOLIN HFA) 108 (90 BASE) MCG/ACT inhaler Inhale 2 puffs into the lungs every 6 (six) hours as needed. For shortness of breath      . digoxin (LANOXIN) 0.125 MG tablet Take 0.125 mg by mouth daily after breakfast.       . Fluticasone-Salmeterol (ADVAIR) 500-50 MCG/DOSE AEPB Inhale 1 puff into the lungs 2 (two) times daily.       Marland Kitchen levothyroxine (SYNTHROID, LEVOTHROID) 125 MCG tablet Take 125 mcg by mouth daily before breakfast.       . lovastatin (MEVACOR) 20 MG tablet Take 20 mg by mouth at bedtime.      . metoprolol succinate (TOPROL-XL)  25 MG 24 hr tablet Take 12.5 mg by mouth daily after breakfast.       . omeprazole (PRILOSEC) 20 MG capsule Take 20 mg by mouth daily with breakfast.      . traMADol (ULTRAM) 50 MG tablet Take 50 mg by mouth every 6 (six) hours as needed. For pain      . verapamil (CALAN-SR) 240 MG CR tablet Take 240 mg by mouth daily with breakfast.       . DISCONTD: cyclobenzaprine (FLEXERIL) 10 MG tablet Take 1 tablet by mouth Three times daily as needed. For pain      . DISCONTD: naproxen sodium (ANAPROX)  220 MG tablet Take 220 mg by mouth 2 (two) times daily as needed. For pain      . oxazepam (SERAX) 10 MG capsule Take 10 mg by mouth at bedtime as needed.        Home: Home Living Lives With: Spouse Available Help at Discharge: Family;Available 24 hours/day Type of Home: House Home Access: Stairs to enter [employer] of Steps: 1 Entrance Stairs-Rails: None Home Layout: One level Bathroom Shower/Tub: Health visitor: Standard Home Adaptive Equipment: Built-in shower seat  Functional History: Prior Function Able to Take Stairs?: Yes Driving: Yes Vocation: Retired Functional Status:  Mobility: Bed Mobility Bed Mobility: Rolling Left;Left Sidelying to Sit Rolling Right: 4: Min assist;With rail Rolling Left: 5: Supervision;With rail Right Sidelying to Sit: 4: Min assist;With rails;HOB flat Left Sidelying to Sit: 5: Supervision;HOB flat Sitting - Scoot to Edge of Bed: 4: Min assist Sit to Sidelying Right: 4: Min assist;With rail;HOB flat Transfers Transfers: Sit to Stand;Stand to Sit Sit to Stand: 4: Min assist;With upper extremity assist;From bed;From elevated surface Sit to Stand: Patient Percentage: 60% Stand to Sit: To chair/3-in-1;With upper extremity assist;With armrests Stand Pivot Transfers: 4: Min assist Ambulation/Gait Ambulation/Gait Assistance: 4: Min assist Ambulation Distance (Feet): 18 Feet Assistive device: Rolling walker Ambulation/Gait Assistance Details: cues for upright posture and safe distance from RW to prevent too much bending especially when turning Gait Pattern: Step-through pattern;Trunk flexed;Lateral trunk lean to left General Gait Details: leaning away from more painful hip Stairs: No Stairs Assistance: 4: Min guard Stairs Assistance Details (indicate cue type and reason): Cues for technique Stair Management Technique: Two rails;Alternating pattern Number of Stairs: 3     ADL: ADL Eating/Feeding:  Performed;Independent Where Assessed - Eating/Feeding: Chair Grooming: Simulated;Supervision/safety;Wash/dry hands Where Assessed - Grooming: Unsupported standing Lower Body Bathing: Simulated;Minimal assistance (with AE) Where Assessed - Lower Body Bathing: Supported sit to stand Upper Body Dressing: Performed;Set up Where Assessed - Upper Body Dressing: Unsupported sitting Lower Body Dressing: Performed;Minimal assistance Where Assessed - Lower Body Dressing: Unsupported sit to stand Toilet Transfer: Simulated;Supervision/safety Toilet Transfer Method: Sit to Barista: Comfort height toilet Tub/Shower Transfer: Performed;Minimal assistance (hand held) Web designer Method: Science writer:  (pt has built in seat) Equipment Used: Gait belt;Back brace;Rolling walker Transfers/Ambulation Related to ADLs: supervision with RW ADL Comments: Pt educated on optimal chair for home use and positioning to adhere to back precautions.  Reminded to change position every hour.  Pt has a borrowed shower seat she may use if built in shower seat is too low.  Cognition: Cognition Arousal/Alertness: Awake/alert Orientation Level: Oriented X4 Cognition Overall Cognitive Status: Appears within functional limits for tasks assessed/performed Arousal/Alertness: Awake/alert Orientation Level: Appears intact for tasks assessed Behavior During Session: [facility] for tasks performed Cognition - Other Comments: Nursing reports pt was mildly confused  earlier this morning but pt was A&O x4.  Blood pressure 159/68, pulse 86, temperature 97.6 F (36.4 C), temperature source Oral, resp. rate 18, height 5\' 2"  (1.575 m), weight 89.1 kg (196 lb 6.9 oz), SpO2 95.00%. Physical Exam  Vitals reviewed. Constitutional: She is oriented to person, place, and time. She appears well-developed.  HENT:  Head: Normocephalic.  Eyes:       Pupils round and reactive to light    Neck: Neck supple. No thyromegaly present.  Cardiovascular: Normal rate and regular rhythm.   Pulmonary/Chest: Breath sounds normal. No respiratory distress. She has no wheezes.  Abdominal: Bowel sounds are normal. She exhibits no distension. There is no tenderness. There is no rebound.  Neurological: She is alert and oriented to person, place, and time.       Follows full commands  Skin:       Back incision clean and dry  Psychiatric: She has a normal mood and affect.  Length is 4/5 in both hip flexors knee extensors ankle dorsiflexors and plantar flexors Upper limbs 5/5 in the deltoid, biceps, triceps, grip Sensation is intact to light touch and proprioception in both Upper and lower limbs Tone is normal No results found for this or any previous visit (from the past 24 hour(s)). Dg Lumbar Spine Complete  02/21/2012  *RADIOLOGY REPORT*  Clinical Data: Recent back surgery, increasing back pain.  LUMBAR SPINE - COMPLETE 4+ VIEW  Comparison: 12/13/2011 radiograph, 02/07/2012 intraoperative spot views.  Findings: Status post interbody fusion at L3-4 and L4-5, with posterior rod and bilateral pedicle screw fixation of L2-L5.  Grade 1 anterolisthesis of L4-L5 is similar to prior.  Multilevel degenerative changes.  No acute fracture or change in alignment. Hardware appears intact, without surrounding lucency.  IMPRESSION: L2-L5 PLIF. No static radiographic evidence of acute osseous change or hardware complication.   Original Report Authenticated By: Waneta Martins, M.D.    Mr Lumbar Spine W Wo Contrast  02/22/2012  *RADIOLOGY REPORT*  Clinical Data: Increasing low back and right hip pain and leg weakness.  MRI LUMBAR SPINE WITHOUT AND WITH CONTRAST  Technique:  Multiplanar and multiecho pulse sequences of the lumbar spine were obtained without and with intravenous contrast.  Contrast: 18mL MULTIHANCE GADOBENATE DIMEGLUMINE 529 MG/ML IV SOLN  Comparison: Radiographs dated 02/21/2012 and MRI dated  12/01/2011  Findings: Tip of the conus is at L2 and appears normal.  T11-12 through L1-2:  Normal.  L2-3:  Persistent small soft disc protrusion.  Excellent posterior decompression of the thecal sac.  There is a prominent seroma in the operative bed extending from L2 through L4-5 extending to just under the skin surface.  There is slight peripheral enhancement. Does the patient have any symptoms suggestive of infection?  The signal intensity of this fluid collection is substantially higher than cerebral spinal fluid.  I do not think this is a CSF leak.  L3-4: Interbody and posterior fusion performed.  Excellent decompression of the thecal sac with no residual impingement.  L4-5:  Interbody and posterior fusion with excellent decompression of the thecal sac.  On the sagittal images there appears to be residual compression of the right L4 nerve root at the lateral aspect of the neural foramen best seen on images #3 of series 3,4,5 and 8.  Does the patient have a right L4 radiculopathy?  L5-S1:  Chronic narrowing of the disc space.  No neural impingement.  IMPRESSION:  1.  The right L4 nerve appears to be impinged upon in  the lateral aspect of the right neural foramen at L4-5. 2.  Large postoperative fluid collection in the surgical bed as described. 3.  Excellent decompression of the spinal canal from L2-3 through L4-5.  No residual significant neural impingement at L2-3 or L3-4.   Original Report Authenticated By: Gwynn Burly, M.D.     Assessment/Plan: Diagnosis: Lumbar spinal stenosis status post decompression and fusion 1. Does the need for close, 24 hr/day medical supervision in concert with the patient's rehab needs make it unreasonable for this patient to be served in a less intensive setting? Yes 2. Co-Morbidities requiring supervision/potential complications: Pain, urinary retention, 3. Due to bladder management, bowel management, safety, skin/wound care, disease management, medication  administration and pain management, does the patient require 24 hr/day rehab nursing? Yes 4. Does the patient require coordinated care of a physician, rehab nurse, PT (1-2 hrs/day, 5 days/week) and OT (1-2 hrs/day, 5 days/week) to address physical and functional deficits in the context of the above medical diagnosis(es)? Yes Addressing deficits in the following areas: balance, endurance, locomotion, strength, transferring, bowel/bladder control, bathing, dressing, feeding, grooming and toileting 5. Can the patient actively participate in an intensive therapy program of at least 3 hrs of therapy per day at least 5 days per week? Yes 6. The potential for patient to make measurable gains while on inpatient rehab is excellent 7. Anticipated functional outcomes upon discharge from inpatient rehab are Modified independent with mobility with PT, Supervision with ADLs with OT, Not applicable with SLP. 8. Estimated rehab length of stay to reach the above functional goals is: 7-10 days 9. Does the patient have adequate social supports to accommodate these discharge functional goals? Yes 10. Anticipated D/C setting: Home 11. Anticipated post D/C treatments: HH therapy 12. Overall Rehab/Functional Prognosis: excellent  RECOMMENDATIONS: This patient's condition is appropriate for continued rehabilitative care in the following setting: CIR Patient has agreed to participate in recommended program. Yes Note that insurance prior authorization may be required for reimbursement for recommended care.  Comment:    02/23/2012

## 2012-02-23 NOTE — Progress Notes (Signed)
Bladder scan revealed urine. I/O cath patient while describing the process. Pt expressed not feeling comfortable catheterizing herself as she should not bend secondary to surgery. RN performed catheterization. 800 ml urine returned. Patient expressed comfort. Will cont to monitor.

## 2012-02-23 NOTE — Progress Notes (Signed)
Pt. States she's having "jelly" like stool. MD made aware. Pt under no s/s distress.

## 2012-02-24 NOTE — Progress Notes (Signed)
Doing well. Feels better Less pain Now voiding and having BM  Temp:  [97.8 F (36.6 C)-98.4 F (36.9 C)] 97.8 F (36.6 C) (09/26 0538) Pulse Rate:  [80-91] 87  (09/26 0538) Resp:  [16-20] 20  (09/26 0538) BP: (123-145)/(55-69) 131/61 mmHg (09/26 0538) SpO2:  [93 %-96 %] 93 % (09/26 0538) Good strength and sensation Incision CDI  Plan: Await rehab

## 2012-02-24 NOTE — Clinical Social Work Note (Signed)
Clinical Social Work  CSW met with pt and husband to address dishcarge plan. Pt has declined CIR and would like SNF. Pt has chosen [facility]. [facility] Debor[facility] Sprang will be able to admit pt on Friday, 02/25/12. Pt and husband are agreeable to discharge plan. CSW will continue to follow to facilitate discharge to [facility].   Dede Query, MSW, Theresia Majors [phone]

## 2012-02-24 NOTE — Progress Notes (Signed)
Rehab admissions - I spoke with patient and her husband.  They have decided to go to rehab in [place] closer to their home.  Social worker is aware.  I will defer inpatient rehab admission.  Call me for questions.  #[phone]

## 2012-02-24 NOTE — Clinical Social Work Note (Signed)
LATER ENTRY  Clinical Social Work  CSW met with pt and pt's husband to address discharge plan. CSW discussed SNF as a back up to CIR. CSW provided bed offers and address transportation via [facility]. CSW also addressed SNF placement in [place], as pt is a resident of [place]. CSW answered questions pt and husband had around discharging to SNF. CSW will continue to follow to facilitate discharge plan.   Dede Query, MSW, Theresia Majors [phone]

## 2012-02-24 NOTE — Progress Notes (Signed)
Physical Therapy Treatment Patient Details Name: Kim Mcgee MRN: [id number] DOB: Sep 15, 1940 Today's Date: 02/24/2012 Time: 3086-5784 PT Time Calculation (min): 18 min  PT Assessment / Plan / Recommendation Comments on Treatment Session  Ambulating 300 feet today. Educated pt on walking shorter distances with seated rest breaks to prevent increased pain and fatigue.    Follow Up Recommendations       Barriers to Discharge        Equipment Recommendations  3 in 1 bedside comode;Rolling walker with 5" wheels    Recommendations for Other Services    Frequency     Plan Discharge plan remains appropriate;Frequency remains appropriate    Precautions / Restrictions Precautions Precautions: Fall;Back Spinal Brace: Thoracolumbosacral orthotic;Applied in sitting position       Mobility  Bed Mobility Bed Mobility: Not assessed (pt sitting upright in chair) Transfers Sit to Stand: With upper extremity assist;1: +2 Total assist;From chair/3-in-1 Sit to Stand: Patient Percentage: 70% Stand to Sit: 4: Min assist;With armrests;To chair/3-in-1 Details for Transfer Assistance: cues for hand placement, upward facilitation bilaterally to assist with sit->stand, slow to rise because of pain Ambulation/Gait Ambulation/Gait Assistance: 4: Min guard;4: Min assist Ambulation Distance (Feet): 300 Feet Assistive device: Rolling walker Ambulation/Gait Assistance Details: min tactile cues for upright posture and proper technique with RW Gait Pattern: Trunk flexed;Decreased stride length      PT Goals Acute Rehab PT Goals PT Goal: Sit to Stand - Progress: Progressing toward goal PT Goal: Stand to Sit - Progress: Progressing toward goal PT Goal: Ambulate - Progress: Progressing toward goal  Visit Information  Last PT Received On: 02/24/12 Assistance Needed: +1    Subjective Data  Subjective: I think I have decided to go to rehab at a skilled nursing facility.    Cognition  Overall  Cognitive Status: Appears within functional limits for tasks assessed/performed Arousal/Alertness: Awake/alert Orientation Level: Appears intact for tasks assessed Behavior During Session: [facility] for tasks performed    Balance     End of Session PT - End of Session Equipment Utilized During Treatment: Gait belt Activity Tolerance: Patient tolerated treatment well;Patient limited by pain Patient left: in chair;with call bell/phone within reach Nurse Communication: Mobility status   GP     [facility] HELEN 02/24/2012, 2:51 PM

## 2012-02-25 NOTE — Progress Notes (Addendum)
Note documented on wrong patient. 

## 2012-02-25 NOTE — Progress Notes (Signed)
Physical Therapy Treatment Patient Details Name: Kim Mcgee MRN: [id number] DOB: 11-09-1940 Today's Date: 02/25/2012 Time: 0811-0826 PT Time Calculation (min): 15 min  PT Assessment / Plan / Recommendation Comments on Treatment Session  Increased pain today limiting her ambulation, RN aware and meds provided pain meds after patient back to bed. Pt reports plans to d/c SNF today.     Follow Up Recommendations  Skilled nursing facility    Barriers to Discharge        Equipment Recommendations  3 in 1 bedside comode;Rolling walker with 5" wheels    Recommendations for Other Services    Frequency Min 5X/week   Plan Frequency remains appropriate;Discharge plan needs to be updated    Precautions / Restrictions Precautions Precautions: Fall Spinal Brace: Thoracolumbosacral orthotic;Applied in sitting position       Mobility  Bed Mobility Bed Mobility: Sit to Sidelying Left;Rolling Right;Rolling Left Rolling Right: 6: Modified independent (Device/Increase time) Rolling Left: 6: Modified independent (Device/Increase time) Sit to Sidelying Left: 4: Min assist Details for Bed Mobility Assistance: min sequencing cues for sit->sidelying left with minA to bring legs to bed level Transfers Transfers: Sit to Stand;Stand to Sit Sit to Stand: 4: Min assist;With upper extremity assist;From toilet Stand to Sit: 4: Min guard Details for Transfer Assistance: stood from elevated toilet using grab bar on the right, min facilitation for follow through to stand Ambulation/Gait Ambulation/Gait Assistance: 4: Min guard Ambulation Distance (Feet): 50 Feet Assistive device: Rolling walker Ambulation/Gait Assistance Details: cues for upright posture and safe technique with RW Gait Pattern: Step-through pattern;Trunk flexed          PT Goals Acute Rehab PT Goals PT Goal: Rolling Supine to Right Side - Progress: Met PT Goal: Rolling Supine to Left Side - Progress: Met PT Goal: Sit to Stand -  Progress: Progressing toward goal PT Goal: Stand to Sit - Progress: Progressing toward goal PT Goal: Ambulate - Progress: Progressing toward goal  Visit Information  Last PT Received On: 02/25/12 Assistance Needed: +1    Subjective Data  Subjective: Oh I just wish we had met on different circumstances.    Cognition  Overall Cognitive Status: Appears within functional limits for tasks assessed/performed Arousal/Alertness: Awake/alert Orientation Level: Appears intact for tasks assessed Behavior During Session: [facility] for tasks performed    Balance     End of Session PT - End of Session Equipment Utilized During Treatment: Gait belt Activity Tolerance: Patient limited by fatigue;Patient limited by pain Patient left: in bed;with call bell/phone within reach Nurse Communication: Mobility status;Patient requests pain meds   GP     [facility] HELEN 02/25/2012, 8:35 AM

## 2012-02-25 NOTE — Clinical Social Work Note (Signed)
Clinical Social Work  Pt is ready for discharge to [facility]. Facility has received all discharge information and is ready to admit pt. Pt and husband are agreeable with discharge plan. [facility] will provide transportation to facility. CSW signing off as no further needs identified.   Dede Query, MSW, Theresia Majors [phone]

## 2012-02-25 NOTE — Progress Notes (Signed)
Pt dc instructions provided. Pt verbalized understanding. Pt under no s/s distress. 

## 2012-02-26 DIAGNOSIS — IMO0002 Reserved for concepts with insufficient information to code with codable children: Secondary | ICD-10-CM

## 2012-02-26 DIAGNOSIS — M47817 Spondylosis without myelopathy or radiculopathy, lumbosacral region: Secondary | ICD-10-CM

## 2014-06-27 ENCOUNTER — Other Ambulatory Visit: Payer: Self-pay | Admitting: Neurological Surgery

## 2014-07-22 ENCOUNTER — Encounter (HOSPITAL_COMMUNITY): Payer: Self-pay

## 2014-07-22 ENCOUNTER — Other Ambulatory Visit (HOSPITAL_COMMUNITY): Payer: Self-pay

## 2014-07-22 ENCOUNTER — Encounter (HOSPITAL_COMMUNITY)
Admission: RE | Admit: 2014-07-22 | Discharge: 2014-07-22 | Disposition: A | Discharge status: Home / Self Care | Payer: Medicare Other | Source: Ambulatory Visit | Attending: Neurological Surgery | Admitting: Neurological Surgery

## 2014-07-22 DIAGNOSIS — Z0183 Encounter for blood typing: Secondary | ICD-10-CM | POA: Diagnosis not present

## 2014-07-22 DIAGNOSIS — M4716 Other spondylosis with myelopathy, lumbar region: Secondary | ICD-10-CM | POA: Diagnosis not present

## 2014-07-22 DIAGNOSIS — Z01812 Encounter for preprocedural laboratory examination: Secondary | ICD-10-CM | POA: Diagnosis not present

## 2014-07-22 HISTORY — DX: Other specified postprocedural states: R11.2

## 2014-07-22 HISTORY — DX: Other specified postprocedural states: Z98.890

## 2014-07-22 LAB — BASIC METABOLIC PANEL
Anion gap: 7 (ref 5–15)
BUN: 9 mg/dL (ref 6–23)
CHLORIDE: 104 mmol/L (ref 96–112)
CO2: 31 mmol/L (ref 19–32)
Calcium: 9.9 mg/dL (ref 8.4–10.5)
Creatinine, Ser: 0.66 mg/dL (ref 0.50–1.10)
GFR calc non Af Amer: 86 mL/min — ABNORMAL LOW (ref >90)
Glucose, Bld: 96 mg/dL (ref 70–99)
Potassium: 4 mmol/L (ref 3.5–5.1)
SODIUM: 142 mmol/L (ref 135–145)

## 2014-07-22 LAB — TYPE AND SCREEN
ABO/RH(D): A POS
Antibody Screen: NEGATIVE

## 2014-07-22 LAB — CBC
HCT: 40.7 % (ref 36.0–46.0)
HEMOGLOBIN: 13.2 g/dL (ref 12.0–15.0)
MCH: 28.1 pg (ref 26.0–34.0)
MCHC: 32.4 g/dL (ref 30.0–36.0)
MCV: 86.6 fL (ref 78.0–100.0)
PLATELETS: 268 10*3/uL (ref 150–400)
RBC: 4.7 MIL/uL (ref 3.87–5.11)
RDW: 13.3 % (ref 11.5–15.5)
WBC: 7.3 10*3/uL (ref 4.0–10.5)

## 2014-07-22 LAB — SURGICAL PCR SCREEN
MRSA, PCR: NEGATIVE
STAPHYLOCOCCUS AUREUS: POSITIVE — AB

## 2014-07-22 NOTE — Pre-Procedure Instructions (Signed)
Kim Mcgee  07/22/2014   Your procedure is scheduled on:  July 30, 2014  Report to [facility] Admitting at 5:30 AM.  Call this number if you have problems the morning of surgery: [phone]   Remember:   Do not eat food or drink liquids after midnight.   Take these medicines the morning of surgery with A SIP OF WATER:  If needed: albuterol (PROVENTIL HFA;VENTOLIN HFA)/ Fluticasone-Salmeterol (ADVAIR) - bring with you if needed, traMADol (ULTRAM)   cyclobenzaprine (FLEXERIL) if needed, digoxin (LANOXIN), levothyroxine (SYNTHROID, LEVOTHROID), metoprolol succinate (TOPROL-XL), omeprazole (PRILOSEC), Tamsulosin HCl (FLOMAX)  STOP MELOXICAM(MOBIC), ASPIRIN, FISH OIL, HERBAL SUPPLEMENTS AS OF February 23   Do not wear jewelry, make-up or nail polish.  Do not wear lotions, powders, or perfumes. You may wear deodorant.  Do not shave 48 hours prior to surgery.   Do not bring valuables to the hospital.  [facility] is not responsible                  for any belongings or valuables.               Contacts, dentures or bridgework may not be worn into surgery.  Leave suitcase in the car. After surgery it may be brought to your room.  For patients admitted to the hospital, discharge time is determined by your treatment team.               Patients discharged the day of surgery will not be allowed to drive home.  Name and phone number of your driver: FAMILY/FRIEND  Special Instructions: "preparing or surgery"   Please read over the following fact sheets that you were given: Pain Booklet, Coughing and Deep Breathing, Blood Transfusion Information and Surgical Site Infection Prevention

## 2014-07-29 MED ORDER — VANCOMYCIN HCL IN DEXTROSE 1-5 GM/200ML-% IV SOLN
1000.0000 mg | INTRAVENOUS | Status: AC
  Administered 2014-07-30: 1000 mg via INTRAVENOUS
  Filled 2014-07-29: qty 200

## 2014-07-30 ENCOUNTER — Encounter (HOSPITAL_COMMUNITY)
Admission: RE | Disposition: A | Discharge status: Home Health | Payer: Medicare Other | Source: Ambulatory Visit | Attending: Neurological Surgery

## 2014-07-30 ENCOUNTER — Inpatient Hospital Stay (HOSPITAL_COMMUNITY): Payer: Medicare Other | Admitting: Anesthesiology

## 2014-07-30 ENCOUNTER — Encounter (HOSPITAL_COMMUNITY): Payer: Self-pay

## 2014-07-30 ENCOUNTER — Inpatient Hospital Stay (HOSPITAL_COMMUNITY): Payer: Medicare Other

## 2014-07-30 ENCOUNTER — Inpatient Hospital Stay (HOSPITAL_COMMUNITY)
Admission: RE | Admit: 2014-07-30 | Discharge: 2014-08-04 | DRG: 460 | Disposition: A | Discharge status: Home Health | Payer: Medicare Other | Source: Ambulatory Visit | Attending: Neurological Surgery | Admitting: Neurological Surgery

## 2014-07-30 DIAGNOSIS — M549 Dorsalgia, unspecified: Secondary | ICD-10-CM | POA: Diagnosis present

## 2014-07-30 DIAGNOSIS — E039 Hypothyroidism, unspecified: Secondary | ICD-10-CM | POA: Diagnosis present

## 2014-07-30 DIAGNOSIS — F419 Anxiety disorder, unspecified: Secondary | ICD-10-CM | POA: Diagnosis present

## 2014-07-30 DIAGNOSIS — J45909 Unspecified asthma, uncomplicated: Secondary | ICD-10-CM | POA: Diagnosis present

## 2014-07-30 DIAGNOSIS — M48061 Spinal stenosis, lumbar region without neurogenic claudication: Secondary | ICD-10-CM | POA: Diagnosis present

## 2014-07-30 DIAGNOSIS — M4806 Spinal stenosis, lumbar region: Secondary | ICD-10-CM | POA: Diagnosis present

## 2014-07-30 DIAGNOSIS — M4326 Fusion of spine, lumbar region: Secondary | ICD-10-CM

## 2014-07-30 DIAGNOSIS — M5116 Intervertebral disc disorders with radiculopathy, lumbar region: Principal | ICD-10-CM | POA: Diagnosis present

## 2014-07-30 DIAGNOSIS — M47896 Other spondylosis, lumbar region: Secondary | ICD-10-CM | POA: Diagnosis present

## 2014-07-30 DIAGNOSIS — I10 Essential (primary) hypertension: Secondary | ICD-10-CM | POA: Diagnosis present

## 2014-07-30 SURGERY — POSTERIOR LUMBAR FUSION 1 LEVEL
Anesthesia: General | Site: Back

## 2014-07-30 MED ORDER — PHENYLEPHRINE 40 MCG/ML (10ML) SYRINGE FOR IV PUSH (FOR BLOOD PRESSURE SUPPORT)
PREFILLED_SYRINGE | INTRAVENOUS | Status: AC
  Filled 2014-07-30: qty 10

## 2014-07-30 MED ORDER — NEOSTIGMINE METHYLSULFATE 10 MG/10ML IV SOLN
INTRAVENOUS | Status: AC
Start: 2014-07-30 — End: 2014-07-30
  Filled 2014-07-30: qty 1

## 2014-07-30 MED ORDER — LIDOCAINE HCL (CARDIAC) 20 MG/ML IV SOLN
INTRAVENOUS | Status: DC | PRN
  Administered 2014-07-30: 60 mg via INTRAVENOUS

## 2014-07-30 MED ORDER — SODIUM CHLORIDE 0.9 % IJ SOLN
INTRAMUSCULAR | Status: AC
  Filled 2014-07-30: qty 10

## 2014-07-30 MED ORDER — METHOCARBAMOL 1000 MG/10ML IJ SOLN
500.0000 mg | Freq: Four times a day (QID) | INTRAVENOUS | Status: DC | PRN
  Filled 2014-07-30: qty 5

## 2014-07-30 MED ORDER — DIGOXIN 125 MCG PO TABS
0.1250 mg | ORAL_TABLET | Freq: Every day | ORAL | Status: DC
  Administered 2014-07-31 – 2014-08-04 (×5): 0.125 mg via ORAL
  Filled 2014-07-30 (×5): qty 1

## 2014-07-30 MED ORDER — OXYCODONE-ACETAMINOPHEN 5-325 MG PO TABS
1.0000 | ORAL_TABLET | ORAL | Status: DC | PRN
  Administered 2014-08-01: 2 via ORAL
  Administered 2014-08-02: 1 via ORAL
  Administered 2014-08-04 (×2): 2 via ORAL
  Filled 2014-07-30 (×11): qty 2

## 2014-07-30 MED ORDER — PHENYLEPHRINE HCL 10 MG/ML IJ SOLN
10.0000 mg | INTRAVENOUS | Status: DC | PRN
  Administered 2014-07-30: 40 ug/min via INTRAVENOUS

## 2014-07-30 MED ORDER — METOPROLOL SUCCINATE ER 25 MG PO TB24
12.5000 mg | ORAL_TABLET | Freq: Every day | ORAL | Status: DC
  Administered 2014-07-31 – 2014-08-04 (×5): 12.5 mg via ORAL
  Filled 2014-07-30 (×5): qty 1

## 2014-07-30 MED ORDER — SODIUM CHLORIDE 0.9 % IV SOLN: 250.0000 mL | INTRAVENOUS | Status: DC

## 2014-07-30 MED ORDER — ACETAMINOPHEN 650 MG RE SUPP: 650.0000 mg | RECTAL | Status: DC | PRN

## 2014-07-30 MED ORDER — ROCURONIUM BROMIDE 50 MG/5ML IV SOLN
INTRAVENOUS | Status: AC
  Filled 2014-07-30: qty 1

## 2014-07-30 MED ORDER — KETOROLAC TROMETHAMINE 15 MG/ML IJ SOLN
15.0000 mg | Freq: Four times a day (QID) | INTRAMUSCULAR | Status: AC
  Administered 2014-07-30 – 2014-07-31 (×4): 15 mg via INTRAVENOUS
  Filled 2014-07-30 (×5): qty 1

## 2014-07-30 MED ORDER — HYDROMORPHONE HCL 1 MG/ML IJ SOLN
INTRAMUSCULAR | Status: AC
  Filled 2014-07-30: qty 1

## 2014-07-30 MED ORDER — KETOROLAC TROMETHAMINE 30 MG/ML IJ SOLN
INTRAMUSCULAR | Status: AC
  Filled 2014-07-30: qty 1

## 2014-07-30 MED ORDER — DEXAMETHASONE 4 MG PO TABS
4.0000 mg | ORAL_TABLET | Freq: Two times a day (BID) | ORAL | Status: DC
  Administered 2014-07-30 – 2014-07-31 (×2): 4 mg via ORAL
  Filled 2014-07-30 (×4): qty 1

## 2014-07-30 MED ORDER — BETHANECHOL CHLORIDE 25 MG PO TABS
25.0000 mg | ORAL_TABLET | Freq: Three times a day (TID) | ORAL | Status: DC
  Administered 2014-07-30 – 2014-08-04 (×15): 25 mg via ORAL
  Filled 2014-07-30 (×15): qty 1

## 2014-07-30 MED ORDER — HYDROMORPHONE HCL 1 MG/ML IJ SOLN
0.5000 mg | INTRAMUSCULAR | Status: DC | PRN
  Administered 2014-07-30: 0.5 mg via INTRAVENOUS
  Filled 2014-07-30 (×2): qty 1

## 2014-07-30 MED ORDER — BACITRACIN 50000 UNITS IM SOLR
INTRAMUSCULAR | Status: DC | PRN
  Administered 2014-07-30: 500 mL

## 2014-07-30 MED ORDER — DEXAMETHASONE SODIUM PHOSPHATE 10 MG/ML IJ SOLN
INTRAMUSCULAR | Status: DC | PRN
  Administered 2014-07-30: 10 mg via INTRAVENOUS

## 2014-07-30 MED ORDER — PROPOFOL 10 MG/ML IV BOLUS
INTRAVENOUS | Status: AC
  Filled 2014-07-30: qty 20

## 2014-07-30 MED ORDER — ARTIFICIAL TEARS OP OINT
TOPICAL_OINTMENT | OPHTHALMIC | Status: AC
Start: 2014-07-30 — End: 2014-07-30
  Filled 2014-07-30: qty 3.5

## 2014-07-30 MED ORDER — PROMETHAZINE HCL 25 MG/ML IJ SOLN
INTRAMUSCULAR | Status: AC
  Filled 2014-07-30: qty 1

## 2014-07-30 MED ORDER — SUCCINYLCHOLINE CHLORIDE 20 MG/ML IJ SOLN
INTRAMUSCULAR | Status: DC | PRN
  Administered 2014-07-30: 140 mg via INTRAVENOUS

## 2014-07-30 MED ORDER — 0.9 % SODIUM CHLORIDE (POUR BTL) OPTIME
TOPICAL | Status: DC | PRN
  Administered 2014-07-30: 1000 mL

## 2014-07-30 MED ORDER — SUCCINYLCHOLINE CHLORIDE 20 MG/ML IJ SOLN
INTRAMUSCULAR | Status: AC
  Filled 2014-07-30: qty 1

## 2014-07-30 MED ORDER — PANTOPRAZOLE SODIUM 40 MG PO TBEC
40.0000 mg | DELAYED_RELEASE_TABLET | Freq: Every day | ORAL | Status: DC
  Administered 2014-07-30 – 2014-08-04 (×6): 40 mg via ORAL
  Filled 2014-07-30 (×6): qty 1

## 2014-07-30 MED ORDER — PHENYLEPHRINE HCL 10 MG/ML IJ SOLN
INTRAMUSCULAR | Status: DC | PRN
  Administered 2014-07-30 (×4): 80 ug via INTRAVENOUS

## 2014-07-30 MED ORDER — LIDOCAINE HCL (CARDIAC) 20 MG/ML IV SOLN
INTRAVENOUS | Status: AC
  Filled 2014-07-30: qty 5

## 2014-07-30 MED ORDER — PREGABALIN 75 MG PO CAPS
75.0000 mg | ORAL_CAPSULE | Freq: Two times a day (BID) | ORAL | Status: DC
  Administered 2014-07-30 – 2014-08-04 (×10): 75 mg via ORAL
  Filled 2014-07-30 (×11): qty 1

## 2014-07-30 MED ORDER — ROCURONIUM BROMIDE 100 MG/10ML IV SOLN
INTRAVENOUS | Status: DC | PRN
  Administered 2014-07-30: 50 mg via INTRAVENOUS

## 2014-07-30 MED ORDER — LACTATED RINGERS IV SOLN
INTRAVENOUS | Status: DC | PRN
  Administered 2014-07-30 (×3): via INTRAVENOUS

## 2014-07-30 MED ORDER — MOMETASONE FURO-FORMOTEROL FUM 200-5 MCG/ACT IN AERO
2.0000 | INHALATION_SPRAY | Freq: Two times a day (BID) | RESPIRATORY_TRACT | Status: DC
  Administered 2014-07-30 – 2014-08-04 (×10): 2 via RESPIRATORY_TRACT
  Filled 2014-07-30: qty 8.8

## 2014-07-30 MED ORDER — PROMETHAZINE HCL 25 MG/ML IJ SOLN
6.2500 mg | INTRAMUSCULAR | Status: DC | PRN
  Administered 2014-07-30: 12.5 mg via INTRAVENOUS

## 2014-07-30 MED ORDER — THROMBIN 20000 UNITS EX SOLR
CUTANEOUS | Status: DC | PRN
  Administered 2014-07-30: 20 mL via TOPICAL

## 2014-07-30 MED ORDER — MUPIROCIN 2 % EX OINT
1.0000 "application " | TOPICAL_OINTMENT | Freq: Two times a day (BID) | CUTANEOUS | Status: DC
  Administered 2014-07-30 – 2014-08-04 (×8): 1 via NASAL
  Filled 2014-07-30: qty 22

## 2014-07-30 MED ORDER — DOCUSATE SODIUM 100 MG PO CAPS
100.0000 mg | ORAL_CAPSULE | Freq: Two times a day (BID) | ORAL | Status: DC
  Administered 2014-07-30 – 2014-08-04 (×10): 100 mg via ORAL
  Filled 2014-07-30 (×11): qty 1

## 2014-07-30 MED ORDER — SENNA 8.6 MG PO TABS
1.0000 | ORAL_TABLET | Freq: Two times a day (BID) | ORAL | Status: DC
Start: 2014-07-30 — End: 2014-08-04
  Administered 2014-07-30 – 2014-08-04 (×10): 8.6 mg via ORAL
  Filled 2014-07-30 (×11): qty 1

## 2014-07-30 MED ORDER — METHOCARBAMOL 500 MG PO TABS
500.0000 mg | ORAL_TABLET | Freq: Four times a day (QID) | ORAL | Status: DC | PRN
  Administered 2014-08-01 – 2014-08-04 (×6): 500 mg via ORAL
  Filled 2014-07-30 (×6): qty 1

## 2014-07-30 MED ORDER — LEVOTHYROXINE SODIUM 25 MCG PO TABS
125.0000 ug | ORAL_TABLET | Freq: Every day | ORAL | Status: DC
  Administered 2014-07-31 – 2014-08-04 (×5): 125 ug via ORAL
  Filled 2014-07-30 (×10): qty 1

## 2014-07-30 MED ORDER — TAMSULOSIN HCL 0.4 MG PO CAPS
0.4000 mg | ORAL_CAPSULE | Freq: Every day | ORAL | Status: DC
  Administered 2014-07-30 – 2014-08-04 (×6): 0.4 mg via ORAL
  Filled 2014-07-30 (×7): qty 1

## 2014-07-30 MED ORDER — GLYCOPYRROLATE 0.2 MG/ML IJ SOLN
INTRAMUSCULAR | Status: AC
  Filled 2014-07-30: qty 2

## 2014-07-30 MED ORDER — ACETAMINOPHEN 325 MG PO TABS
650.0000 mg | ORAL_TABLET | ORAL | Status: DC | PRN
  Administered 2014-07-31: 650 mg via ORAL
  Filled 2014-07-30: qty 2

## 2014-07-30 MED ORDER — ONDANSETRON HCL 4 MG/2ML IJ SOLN
INTRAMUSCULAR | Status: AC
  Filled 2014-07-30: qty 2

## 2014-07-30 MED ORDER — CEFAZOLIN SODIUM 1-5 GM-% IV SOLN
1.0000 g | Freq: Three times a day (TID) | INTRAVENOUS | Status: AC
  Administered 2014-07-30 (×2): 1 g via INTRAVENOUS
  Filled 2014-07-30 (×2): qty 50

## 2014-07-30 MED ORDER — PROPOFOL 10 MG/ML IV BOLUS
INTRAVENOUS | Status: DC | PRN
  Administered 2014-07-30: 170 mg via INTRAVENOUS

## 2014-07-30 MED ORDER — POLYETHYLENE GLYCOL 3350 17 G PO PACK
17.0000 g | PACK | Freq: Every day | ORAL | Status: DC
  Administered 2014-07-31 – 2014-08-04 (×5): 17 g via ORAL
  Filled 2014-07-30 (×6): qty 1

## 2014-07-30 MED ORDER — ONDANSETRON HCL 4 MG/2ML IJ SOLN: 4.0000 mg | INTRAMUSCULAR | Status: DC | PRN

## 2014-07-30 MED ORDER — CHLORHEXIDINE GLUCONATE CLOTH 2 % EX PADS
6.0000 | MEDICATED_PAD | Freq: Every day | CUTANEOUS | Status: DC
  Administered 2014-08-01 – 2014-08-04 (×4): 6 via TOPICAL

## 2014-07-30 MED ORDER — ARTIFICIAL TEARS OP OINT
TOPICAL_OINTMENT | OPHTHALMIC | Status: DC | PRN
  Administered 2014-07-30: 1 via OPHTHALMIC

## 2014-07-30 MED ORDER — FENTANYL CITRATE 0.05 MG/ML IJ SOLN
INTRAMUSCULAR | Status: AC
  Filled 2014-07-30: qty 5

## 2014-07-30 MED ORDER — MIDAZOLAM HCL 5 MG/5ML IJ SOLN
INTRAMUSCULAR | Status: DC | PRN
  Administered 2014-07-30 (×2): 1 mg via INTRAVENOUS

## 2014-07-30 MED ORDER — LIDOCAINE-EPINEPHRINE 1 %-1:100000 IJ SOLN
INTRAMUSCULAR | Status: DC | PRN
  Administered 2014-07-30: 5 mL

## 2014-07-30 MED ORDER — TRAMADOL HCL 50 MG PO TABS: 50.0000 mg | ORAL_TABLET | Freq: Four times a day (QID) | ORAL | Status: DC | PRN

## 2014-07-30 MED ORDER — ALUM & MAG HYDROXIDE-SIMETH 200-200-20 MG/5ML PO SUSP: 30.0000 mL | Freq: Four times a day (QID) | ORAL | Status: DC | PRN

## 2014-07-30 MED ORDER — HYDROMORPHONE HCL 1 MG/ML IJ SOLN
0.2500 mg | INTRAMUSCULAR | Status: DC | PRN
Start: 2014-07-30 — End: 2014-07-30
  Administered 2014-07-30 (×4): 0.5 mg via INTRAVENOUS

## 2014-07-30 MED ORDER — VERAPAMIL HCL ER 240 MG PO TBCR
240.0000 mg | EXTENDED_RELEASE_TABLET | Freq: Every day | ORAL | Status: DC
  Administered 2014-07-31 – 2014-08-04 (×5): 240 mg via ORAL
  Filled 2014-07-30 (×6): qty 1

## 2014-07-30 MED ORDER — BISACODYL 10 MG RE SUPP
10.0000 mg | Freq: Every day | RECTAL | Status: DC | PRN
  Filled 2014-07-30: qty 1

## 2014-07-30 MED ORDER — MENTHOL 3 MG MT LOZG: 1.0000 | LOZENGE | OROMUCOSAL | Status: DC | PRN

## 2014-07-30 MED ORDER — BUPIVACAINE HCL (PF) 0.5 % IJ SOLN
INTRAMUSCULAR | Status: DC | PRN
  Administered 2014-07-30: 5 mL

## 2014-07-30 MED ORDER — CYCLOBENZAPRINE HCL 10 MG PO TABS: 10.0000 mg | ORAL_TABLET | Freq: Three times a day (TID) | ORAL | Status: DC | PRN

## 2014-07-30 MED ORDER — MIDAZOLAM HCL 2 MG/2ML IJ SOLN
INTRAMUSCULAR | Status: AC
  Filled 2014-07-30: qty 2

## 2014-07-30 MED ORDER — PHENOL 1.4 % MT LIQD
1.0000 | OROMUCOSAL | Status: DC | PRN
Start: 2014-07-30 — End: 2014-08-04

## 2014-07-30 MED ORDER — THROMBIN 5000 UNITS EX SOLR
CUTANEOUS | Status: DC | PRN
  Administered 2014-07-30 (×2): 10 mL via TOPICAL

## 2014-07-30 MED ORDER — SODIUM CHLORIDE 0.9 % IV SOLN
INTRAVENOUS | Status: DC
  Administered 2014-07-30: 15:00:00 via INTRAVENOUS

## 2014-07-30 MED ORDER — ALBUTEROL SULFATE (2.5 MG/3ML) 0.083% IN NEBU: 3.0000 mL | INHALATION_SOLUTION | Freq: Four times a day (QID) | RESPIRATORY_TRACT | Status: DC | PRN

## 2014-07-30 MED ORDER — NEOSTIGMINE METHYLSULFATE 10 MG/10ML IV SOLN
INTRAVENOUS | Status: DC | PRN
  Administered 2014-07-30: 2 mg via INTRAVENOUS

## 2014-07-30 MED ORDER — DEXAMETHASONE SODIUM PHOSPHATE 10 MG/ML IJ SOLN
INTRAMUSCULAR | Status: AC
  Filled 2014-07-30: qty 1

## 2014-07-30 MED ORDER — EPHEDRINE SULFATE 50 MG/ML IJ SOLN
INTRAMUSCULAR | Status: AC
  Filled 2014-07-30: qty 1

## 2014-07-30 MED ORDER — POLYETHYLENE GLYCOL 3350 17 G PO PACK: 17.0000 g | PACK | Freq: Every day | ORAL | Status: DC | PRN

## 2014-07-30 MED ORDER — FLEET ENEMA 7-19 GM/118ML RE ENEM: 1.0000 | ENEMA | Freq: Once | RECTAL | Status: AC | PRN

## 2014-07-30 MED ORDER — GLYCOPYRROLATE 0.2 MG/ML IJ SOLN
INTRAMUSCULAR | Status: AC
  Filled 2014-07-30: qty 3

## 2014-07-30 MED ORDER — OXYCODONE-ACETAMINOPHEN 5-325 MG PO TABS
1.0000 | ORAL_TABLET | ORAL | Status: DC | PRN
Start: 2014-07-30 — End: 2014-08-04
  Administered 2014-07-30 – 2014-08-02 (×10): 2 via ORAL
  Administered 2014-08-02 – 2014-08-03 (×2): 1 via ORAL
  Administered 2014-08-03 (×2): 2 via ORAL
  Filled 2014-07-30: qty 2
  Filled 2014-07-30: qty 1
  Filled 2014-07-30 (×5): qty 2

## 2014-07-30 MED ORDER — ONDANSETRON HCL 4 MG/2ML IJ SOLN
INTRAMUSCULAR | Status: DC | PRN
  Administered 2014-07-30: 4 mg via INTRAVENOUS

## 2014-07-30 MED ORDER — FENTANYL CITRATE 0.05 MG/ML IJ SOLN
INTRAMUSCULAR | Status: DC | PRN
  Administered 2014-07-30 (×3): 50 ug via INTRAVENOUS
  Administered 2014-07-30: 25 ug via INTRAVENOUS
  Administered 2014-07-30: 100 ug via INTRAVENOUS
  Administered 2014-07-30: 25 ug via INTRAVENOUS
  Administered 2014-07-30 (×4): 50 ug via INTRAVENOUS

## 2014-07-30 MED ORDER — SODIUM CHLORIDE 0.9 % IJ SOLN
3.0000 mL | Freq: Two times a day (BID) | INTRAMUSCULAR | Status: DC
  Administered 2014-07-31 – 2014-08-04 (×6): 3 mL via INTRAVENOUS

## 2014-07-30 MED ORDER — ARTIFICIAL TEARS OP OINT
TOPICAL_OINTMENT | OPHTHALMIC | Status: AC
  Filled 2014-07-30: qty 3.5

## 2014-07-30 MED ORDER — GLYCOPYRROLATE 0.2 MG/ML IJ SOLN
INTRAMUSCULAR | Status: DC | PRN
  Administered 2014-07-30: 0.3 mg via INTRAVENOUS

## 2014-07-30 MED ORDER — SODIUM CHLORIDE 0.9 % IJ SOLN: 3.0000 mL | INTRAMUSCULAR | Status: DC | PRN

## 2014-07-30 MED ORDER — ACETAMINOPHEN 10 MG/ML IV SOLN
INTRAVENOUS | Status: AC
  Filled 2014-07-30: qty 100

## 2014-07-30 MED ORDER — SCOPOLAMINE 1 MG/3DAYS TD PT72
MEDICATED_PATCH | TRANSDERMAL | Status: AC
  Administered 2014-07-30: 1 via TRANSDERMAL
  Filled 2014-07-30: qty 1

## 2014-07-30 MED ORDER — KETOROLAC TROMETHAMINE 30 MG/ML IJ SOLN
INTRAMUSCULAR | Status: DC | PRN
  Administered 2014-07-30: 30 mg via INTRAVENOUS

## 2014-07-30 SURGICAL SUPPLY — 65 items
BAG DECANTER FOR FLEXI CONT (MISCELLANEOUS) ×3 IMPLANT
BLADE CLIPPER SURG (BLADE) IMPLANT
BONE CANC CHIPS 20CC PCAN1/4 (Bone Implant) ×3 IMPLANT
BONE MATRIX OSTEOCEL PRO MED (Bone Implant) ×3 IMPLANT
BUR MATCHSTICK NEURO 3.0 LAGG (BURR) ×3 IMPLANT
CAGE COROENT MP 8X9X23M-8 SPIN (Cage) ×6 IMPLANT
CANISTER SUCT 3000ML PPV (MISCELLANEOUS) ×3 IMPLANT
CHIPS CANC BONE 20CC PCAN1/4 (Bone Implant) ×1 IMPLANT
CONT SPEC 4OZ CLIKSEAL STRL BL (MISCELLANEOUS) ×6 IMPLANT
COVER BACK TABLE 60X90IN (DRAPES) ×3 IMPLANT
DECANTER SPIKE VIAL GLASS SM (MISCELLANEOUS) ×3 IMPLANT
DERMABOND ADVANCED (GAUZE/BANDAGES/DRESSINGS) ×2
DERMABOND ADVANCED .7 DNX12 (GAUZE/BANDAGES/DRESSINGS) ×1 IMPLANT
DRAPE C-ARM 42X72 X-RAY (DRAPES) ×6 IMPLANT
DRAPE LAPAROTOMY 100X72X124 (DRAPES) ×3 IMPLANT
DRAPE POUCH INSTRU U-SHP 10X18 (DRAPES) ×3 IMPLANT
DRAPE PROXIMA HALF (DRAPES) IMPLANT
DRSG OPSITE POSTOP 4X6 (GAUZE/BANDAGES/DRESSINGS) ×6 IMPLANT
DURAPREP 26ML APPLICATOR (WOUND CARE) ×3 IMPLANT
ELECT REM PT RETURN 9FT ADLT (ELECTROSURGICAL) ×3
ELECTRODE REM PT RTRN 9FT ADLT (ELECTROSURGICAL) ×1 IMPLANT
GAUZE SPONGE 4X4 12PLY STRL (GAUZE/BANDAGES/DRESSINGS) ×3 IMPLANT
GAUZE SPONGE 4X4 16PLY XRAY LF (GAUZE/BANDAGES/DRESSINGS) IMPLANT
GLOVE BIOGEL PI IND STRL 7.5 (GLOVE) ×4 IMPLANT
GLOVE BIOGEL PI IND STRL 8.5 (GLOVE) ×2 IMPLANT
GLOVE BIOGEL PI INDICATOR 7.5 (GLOVE) ×8
GLOVE BIOGEL PI INDICATOR 8.5 (GLOVE) ×4
GLOVE ECLIPSE 8.0 STRL XLNG CF (GLOVE) ×3 IMPLANT
GLOVE ECLIPSE 8.5 STRL (GLOVE) ×6 IMPLANT
GLOVE EXAM NITRILE LRG STRL (GLOVE) IMPLANT
GLOVE EXAM NITRILE MD LF STRL (GLOVE) IMPLANT
GLOVE EXAM NITRILE XL STR (GLOVE) IMPLANT
GLOVE EXAM NITRILE XS STR PU (GLOVE) IMPLANT
GLOVE SS N UNI LF 8.0 STRL (GLOVE) ×15 IMPLANT
GOWN STRL REUS W/ TWL LRG LVL3 (GOWN DISPOSABLE) IMPLANT
GOWN STRL REUS W/ TWL XL LVL3 (GOWN DISPOSABLE) ×2 IMPLANT
GOWN STRL REUS W/TWL 2XL LVL3 (GOWN DISPOSABLE) ×6 IMPLANT
GOWN STRL REUS W/TWL LRG LVL3 (GOWN DISPOSABLE)
GOWN STRL REUS W/TWL XL LVL3 (GOWN DISPOSABLE) ×4
HEMOSTAT POWDER KIT SURGIFOAM (HEMOSTASIS) ×6 IMPLANT
KIT BASIN OR (CUSTOM PROCEDURE TRAY) ×3 IMPLANT
KIT ROOM TURNOVER OR (KITS) ×3 IMPLANT
LIQUID BAND (GAUZE/BANDAGES/DRESSINGS) ×3 IMPLANT
NEEDLE HYPO 22GX1.5 SAFETY (NEEDLE) ×3 IMPLANT
NS IRRIG 1000ML POUR BTL (IV SOLUTION) ×3 IMPLANT
PACK LAMINECTOMY NEURO (CUSTOM PROCEDURE TRAY) ×3 IMPLANT
PAD ARMBOARD 7.5X6 YLW CONV (MISCELLANEOUS) ×9 IMPLANT
PATTIES SURGICAL .5 X1 (DISPOSABLE) ×3 IMPLANT
ROD RELINE O-H CON M 5.0/6.0MM (Rod) ×6 IMPLANT
ROD RELINE TI LATERAL MED OFF (Rod) ×6 IMPLANT
SCREW LOCK RELINE 5.5 TULIP (Screw) ×18 IMPLANT
SPONGE LAP 4X18 X RAY DECT (DISPOSABLE) IMPLANT
SPONGE SURGIFOAM ABS GEL 100 (HEMOSTASIS) ×3 IMPLANT
SUT PROLENE 6 0 BV (SUTURE) ×6 IMPLANT
SUT VIC AB 1 CT1 18XBRD ANBCTR (SUTURE) ×1 IMPLANT
SUT VIC AB 1 CT1 8-18 (SUTURE) ×2
SUT VIC AB 2-0 CP2 18 (SUTURE) ×3 IMPLANT
SUT VIC AB 3-0 SH 8-18 (SUTURE) ×6 IMPLANT
SYR 20ML ECCENTRIC (SYRINGE) ×3 IMPLANT
SYR 3ML LL SCALE MARK (SYRINGE) ×12 IMPLANT
TOWEL OR 17X24 6PK STRL BLUE (TOWEL DISPOSABLE) ×3 IMPLANT
TOWEL OR 17X26 10 PK STRL BLUE (TOWEL DISPOSABLE) ×3 IMPLANT
TRAP SPECIMEN MUCOUS 40CC (MISCELLANEOUS) ×3 IMPLANT
TRAY FOLEY CATH 14FRSI W/METER (CATHETERS) ×3 IMPLANT
WATER STERILE IRR 1000ML POUR (IV SOLUTION) ×3 IMPLANT

## 2014-07-30 NOTE — Progress Notes (Signed)
Patient ID: Kim Mcgee, female   DOB: [birth year], 74 y.o.   MRN: [id number] Feels well save for slight head ache.

## 2014-07-30 NOTE — Op Note (Signed)
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 Status post arthrodesis L2-L5, in 2013. 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 Status post arthrodesis L2-L5, in 2013 Procedure: Decompression of L1-L2 with posterior lumbar interbody arthrodesis using peek spacers local autograft and allograft. Pedicle screw fixation L1-L2, posterior lateral arthrodesis L1-L2. Surgeon: [name] First assistant: Karie Chimera M.D. Anesthesia: Gen. endotracheal Indications: Kim Mcgee is a 74 year old individual who has had problems with back pain and left lower extremity pain. She has had a previous decompression and fusion in 2013 by Dr. [name]. She now has advanced degenerative changes at L1-L2 with a large herniated nucleus pulposus at this level on the left side. She's been advised regarding the need for surgery to decompress also stabilized L1-L2 level.  Procedure: Patient was brought to the operating room supine on a stretcher. After the smooth induction of general endotracheal anesthesia, she was turned prone. The back was prepped with alcohol and DuraPrep and draped in a sterile fashion. The previously made midline incision was opened and the top half of it in the incision was extended superiorly for an additional 3 cm. Dissection was carried to the lumbar dorsal fascia. The spinous process of L2 was identified along with the spinous processes of L1 and the previously placed hardware was also identified with the pedicle screw at L2 and a transverse connector being readily identified. These areas were skeletonized. The transverse connector was then removed. The dissection was carried up in a subperiosteal fashion to expose all of L1-L2 complex and transverse process of L1. The lateral gutters were packed off  between L1 and L2 for later use as grafting. The laminectomy was completed removing the inferior margin lamina of L1-L2 including the entirety of the facet. The entire laminar arch of L2 was removed and during this process a dural incursion occurred on the dorsal surface of the dura were was fibrosed to the undersurface of the lamina. This was carefully dissected and closed with a running 6-0 Prolene suture. A second smaller dural incursion also occurred on the side related to adherent ligamentous tissue. This was similarly closed. The dissection then continued by exposing the disc space at L1-L2. The disc was chronically herniated particularly on the left side and this was also scarred to the dura. Mobilization was painstakingly slow with care being used to protect the dura and the contents. The disc space was then able to be entered on the lateral aspect and a complete discectomy was performed at the L1-L2 level bilaterally. The L1 nerve root was decompressed and protected carefully as was the common dural tube and the L2 nerve root inferiorly. Once the disc space was completely evacuated and the endplates were curettaged they were sized for appropriate size spacer. Is felt then 8 mm tall 23 mm long 8 lordotic spacer would fit best in this interval. The disc space was then filled with a combination of allograft croutons mixed with osseous cell and the autograft that had been harvested from the laminectomy. 6 mL of graft was placed into the interspace in addition to the 2 spacers. Once the interspace was filled attention was turned to pedicle entry sites at L1. 6.5 x 50 mm screws were placed into L1 bilaterally using fluoroscopic guidance. The lateral gutters had been decorticated and packed off between L1 and L2. A rod attachment system from new invasive was then used  to secure a link to the place were the transverse connector had been removed. An S-shaped rod was then uses template to measure the appropriate  length of rod that would be used to connect between the add on and the L1 pedicle screw. These were then cut and placed and secured with 3 locking caps. Final radiographs identified good position of the hardware good reduction and alignment of the additional fixation. At this point lateral gutters were packed with the remainder of autograft and allograft. Care was taken to make sure that the common dural tube the L1 nerve roots L2 nerve roots inferiorly were well decompressed at time of closure. Hemostasis was obtained with some Surgifoam and Gelfoam thrombin pledgets which were irrigated away. The lumbar dorsal fascia was then closed with #1 Vicryl in interrupted fashion, 2-0 Vicryl was used in the subcutaneous tissues, 3-0 Vicryl subcuticularly. Dermabond was placed on the skin. Blood loss was estimated 250 mL.

## 2014-07-30 NOTE — Anesthesia Postprocedure Evaluation (Signed)
  Anesthesia Post-op Note  Patient: Kim Mcgee  Procedure(s) Performed: Procedure(s) with comments: L1-2 Posterior lumbar interbody fusion  (N/A) - L1-2 Posterior lumbar interbody fusion   Patient Location: PACU  Anesthesia Type:General  Level of Consciousness: awake  Airway and Oxygen Therapy: Patient Spontanous Breathing  Post-op Pain: mild  Post-op Assessment: Post-op Vital signs reviewed  Post-op Vital Signs: Reviewed  Last Vitals:  Filed Vitals:   07/30/14 1215  BP: 124/48  Pulse: 81  Temp:   Resp: 15    Complications: No apparent anesthesia complications

## 2014-07-30 NOTE — Anesthesia Procedure Notes (Signed)
Procedure Name: Intubation Date/Time: 07/30/2014 7:36 AM Performed by: Susa Loffler Pre-anesthesia Checklist: Patient identified, Timeout performed, Emergency Drugs available, Suction available and Patient being monitored Patient Re-evaluated:Patient Re-evaluated prior to inductionOxygen Delivery Method: Circle system utilized Preoxygenation: Pre-oxygenation with 100% oxygen Intubation Type: IV induction Laryngoscope Size: Mac and 3 Grade View: Grade II Tube type: Oral Tube size: 7.0 mm Number of attempts: 1 Airway Equipment and Method: Stylet Placement Confirmation: ETT inserted through vocal cords under direct vision,  positive ETCO2 and breath sounds checked- equal and bilateral Secured at: 21 cm Tube secured with: Tape Dental Injury: Teeth and Oropharynx as per pre-operative assessment

## 2014-07-30 NOTE — Transfer of Care (Signed)
Immediate Anesthesia Transfer of Care Note  Patient: Kim Mcgee  Procedure(s) Performed: Procedure(s) with comments: L1-2 Posterior lumbar interbody fusion  (N/A) - L1-2 Posterior lumbar interbody fusion   Patient Location: PACU  Anesthesia Type:General  Level of Consciousness: awake, alert  and oriented  Airway & Oxygen Therapy: Patient Spontanous Breathing and Patient connected to nasal cannula oxygen  Post-op Assessment: Report given to RN, Post -op Vital signs reviewed and stable and Patient moving all extremities X 4  Post vital signs: Reviewed and stable  Last Vitals:  Filed Vitals:   07/30/14 0553  BP: 147/57  Pulse: 82  Temp: 36.2 C  Resp: 20    Complications: No apparent anesthesia complications

## 2014-07-30 NOTE — Progress Notes (Signed)
Orthopedic Tech Progress Note Patient Details:  Kim Mcgee 02-27-1941 [id number]  Patient ID: Bary Richard, female   DOB: 1940/12/13, 74 y.o.   MRN: [id number] Tye Maryland from [facility] stated that pt has been pre fitted for the aspen lumbar fusion brace  Hildred Priest 07/30/2014, 1:53 PM

## 2014-07-30 NOTE — Progress Notes (Signed)
Patient arrived to 4N from PACU, escorted by PACU RN. VSS, neuro assessment stable, see assessments. Patient educated about fall prevention policy. Patient stated she will call prior to ambulation. Call bell within patient's reach. Will continue to monitor closely.

## 2014-07-30 NOTE — Clinical Social Work Note (Signed)
CSW Consult Acknowledged:   CSW received a consult for SNF placement. CSW awaiting PT/OT evaluation to determine the appropriate level of care.      Edmund Holcomb, MSW, LCSWA [phone]  

## 2014-07-30 NOTE — Anesthesia Preprocedure Evaluation (Signed)
Anesthesia Evaluation  Patient identified by MRN, date of birth, ID band Patient awake    Airway Mallampati: II  TM Distance: >3 FB Neck ROM: Full    Dental   Pulmonary shortness of breath, asthma ,  breath sounds clear to auscultation        Cardiovascular hypertension, + dysrhythmias Rhythm:Regular Rate:Normal     Neuro/Psych    GI/Hepatic Neg liver ROS, GERD-  ,  Endo/Other  Hypothyroidism   Renal/GU negative Renal ROS     Musculoskeletal   Abdominal   Peds  Hematology   Anesthesia Other Findings   Reproductive/Obstetrics                             Anesthesia Physical Anesthesia Plan  ASA: III  Anesthesia Plan: General   Post-op Pain Management:    Induction: Intravenous  Airway Management Planned: Oral ETT  Additional Equipment:   Intra-op Plan:   Post-operative Plan: Extubation in OR  Informed Consent: I have reviewed the patients History and Physical, chart, labs and discussed the procedure including the risks, benefits and alternatives for the proposed anesthesia with the patient or authorized representative who has indicated his/her understanding and acceptance.   Dental advisory given  Plan Discussed with: CRNA and Anesthesiologist  Anesthesia Plan Comments:         Anesthesia Quick Evaluation

## 2014-07-30 NOTE — Progress Notes (Signed)
Utilization review completed.  

## 2014-07-30 NOTE — H&P (Signed)
Kim Mcgee is an 74 y.o. female.   Chief Complaint: Back and bilateral leg pain and weakness HPI: Patient is a 74 year old individual who had surgery by Dr. Luiz Ochoa a couple of years ago to include a fusion from L2-5. She did well for a while but now has further back pain at the thoraco lumbar junction with weakness into both legs. 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  Given the changes she has been advised regarding surgery at L1-2.  Past Medical History  Diagnosis Date  . Hypertension   . Hypothyroidism   . Anxiety   . Asthma   . Shortness of breath     /w walking long distance   . Dysrhythmia   . GERD (gastroesophageal reflux disease)   . Cancer     basal cell CA  . Arthritis     lumbar DDD  . Normal cardiac stress test   . Swallowing difficulty     being seen by ENT- had endoscopy- told that its probably related to past neck surgeries   . PONV (postoperative nausea and vomiting)     Past Surgical History  Procedure Laterality Date  . Total thyroidectomy      1990's  . Appendectomy    . Tonsillectomy    . Cardiac catheterization      "long time ago"   . Tubal ligation    . Cervical fusion    . Eye surgery      L eye- cataract removed  - /w IOL     No family history on file. Social History:  reports that she has never smoked. She does not have any smokeless tobacco history on file. She reports that she does not drink alcohol or use illicit drugs.  Allergies:  Allergies  Allergen Reactions  . Macrodantin [Nitrofurantoin]     Chest pain  . Penicillins Rash  . Tape Rash    "adhesive tape"    Medications Prior to Admission  Medication Sig Dispense Refill  . albuterol (PROVENTIL HFA;VENTOLIN HFA) 108 (90 BASE) MCG/ACT inhaler Inhale 2 puffs into the lungs every 6 (six) hours as needed. For shortness of breath    . cyclobenzaprine (FLEXERIL) 10 MG  tablet Take 1 tablet (10 mg total) by mouth 3 (three) times daily as needed for muscle spasms. For pain 50 tablet 3  . digoxin (LANOXIN) 0.125 MG tablet Take 0.125 mg by mouth daily after breakfast.     . Fluticasone-Salmeterol (ADVAIR) 500-50 MCG/DOSE AEPB Inhale 1 puff into the lungs 2 (two) times daily as needed (shortness of breath).     Marland Kitchen levothyroxine (SYNTHROID, LEVOTHROID) 125 MCG tablet Take 125 mcg by mouth daily before breakfast.     . meloxicam (MOBIC) 15 MG tablet Take 15 mg by mouth daily.    . metoprolol succinate (TOPROL-XL) 25 MG 24 hr tablet Take 12.5 mg by mouth daily after breakfast.     . omeprazole (PRILOSEC) 20 MG capsule Take 20 mg by mouth daily with breakfast.    . oxazepam (SERAX) 10 MG capsule Take 10 mg by mouth at bedtime as needed.    . polyethylene glycol (MIRALAX / GLYCOLAX) packet Take 17 g by mouth daily. 14 each   . Tamsulosin HCl (FLOMAX) 0.4 MG CAPS Take 1 capsule (0.4 mg total) by mouth daily. 30 capsule   . traMADol (ULTRAM) 50 MG tablet Take 50 mg by  mouth every 6 (six) hours as needed. For pain    . verapamil (CALAN-SR) 240 MG CR tablet Take 240 mg by mouth daily with breakfast.     . bethanechol (URECHOLINE) 25 MG tablet Take 1 tablet (25 mg total) by mouth 3 (three) times daily. (Patient not taking: Reported on 07/19/2014)    . oxyCODONE-acetaminophen (PERCOCET/ROXICET) 5-325 MG per tablet Take 1-2 tablets by mouth every 4 (four) hours as needed. (Patient not taking: Reported on 07/19/2014) 61 tablet 0  . pregabalin (LYRICA) 75 MG capsule Take 1 capsule (75 mg total) by mouth 2 (two) times daily. (Patient not taking: Reported on 07/19/2014)      No results found for this or any previous visit (from the past 48 hour(s)). No results found.  Review of Systems  Constitutional: Negative.   HENT: Negative.   Eyes: Negative.   Respiratory: Negative.   Cardiovascular: Negative.   Gastrointestinal: Negative.   Genitourinary: Negative.   Musculoskeletal:  Positive for back pain.  Skin: Negative.   Neurological: Positive for tingling, sensory change and focal weakness.  Endo/Heme/Allergies: Negative.   Psychiatric/Behavioral: Negative.     Blood pressure 147/57, pulse 82, temperature 97.1 F (36.2 C), temperature source Oral, resp. rate 20, height 5\' 2"  (1.575 m), weight 75.297 kg (166 lb), SpO2 100 %. Physical Exam  Constitutional: She is oriented to person, place, and time. She appears well-developed and well-nourished.  HENT:  Head: Normocephalic and atraumatic.  Eyes: Conjunctivae and EOM are normal. Pupils are equal, round, and reactive to light.  Neck: Normal range of motion. Neck supple.  Cardiovascular: Normal rate and regular rhythm.   Respiratory: Effort normal and breath sounds normal.  GI: Soft. Bowel sounds are normal.  Musculoskeletal:  Healed scar from L2-5 fusion.  Neurological: She is alert and oriented to person, place, and time.  Mild weakness in both tibialis anterior4/5. Positive strait leg raise at 30 degrees  Skin: Skin is warm and dry.  Psychiatric: She has a normal mood and affect. Her behavior is normal. Judgment and thought content normal.     Assessment/Plan Spondylosis and stenosis at L1-2 . Fusion of L2 to sacrum. Decompressi and fuse L1-2  Kim Mcgee 07/30/2014, 6:19 AM

## 2014-07-31 MED ORDER — OXYCODONE-ACETAMINOPHEN 5-325 MG PO TABS: 1.0000 | ORAL_TABLET | ORAL | Status: DC | PRN

## 2014-07-31 MED ORDER — DEXAMETHASONE 2 MG PO TABS
2.0000 mg | ORAL_TABLET | Freq: Two times a day (BID) | ORAL | Status: DC
  Administered 2014-07-31 – 2014-08-03 (×6): 2 mg via ORAL
  Filled 2014-07-31 (×6): qty 1

## 2014-07-31 MED ORDER — METHOCARBAMOL 500 MG PO TABS: 500.0000 mg | ORAL_TABLET | Freq: Four times a day (QID) | ORAL | Status: DC | PRN

## 2014-07-31 MED ORDER — DEXAMETHASONE 0.75 MG PO TABS: ORAL_TABLET | ORAL | Status: DC

## 2014-07-31 MED FILL — Sodium Chloride IV Soln 0.9%: INTRAVENOUS | Qty: 1000 | Status: AC

## 2014-07-31 MED FILL — Heparin Sodium (Porcine) Inj 1000 Unit/ML: INTRAMUSCULAR | Qty: 30 | Status: AC

## 2014-07-31 NOTE — Progress Notes (Signed)
Pt's IV was painful and leaking. After assessment, bedside RN removed the IV. Pt isn't getting fluids through the IV, and the only med she is getting is Toradol. Pt would rather not have an IV if possible. MD paged regarding loss of IV access, and to determine if Toradol could be replaced with a PO med. Waiting for further orders. Will continue to monitor pt.

## 2014-07-31 NOTE — Evaluation (Signed)
Physical Therapy Evaluation Patient Details Name: Kim Mcgee MRN: [id number] DOB: Sep 28, 1940 Today's Date: 07/31/2014   History of Present Illness  Patient is a 74 year old individual who had surgery by Dr. Luiz Ochoa a couple of years ago to include a fusion from L2-5. She did well for a while but now has further back pain at the thoraco lumbar junction with weakness into both legs. 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 PMH: HTN, GERD. Pt prsents for fusion of L1-2  Clinical Impression  Pt admitted with above diagnosis. Pt currently with functional limitations due to the deficits listed below (see PT Problem List). Pt ambulated 100' with RW and min A, guarded gait. Pt will benefit from skilled PT to increase their independence and safety with mobility to allow discharge to the venue listed below.       Follow Up Recommendations Home health PT;Supervision - Intermittent    Equipment Recommendations  None recommended by PT    Recommendations for Other Services OT consult     Precautions / Restrictions Precautions Precautions: Back Precaution Booklet Issued: Yes (comment) Precaution Comments: reviewed precautions Required Braces or Orthoses: Spinal Brace Spinal Brace: Applied in sitting position;Lumbar corset Restrictions Weight Bearing Restrictions: No      Mobility  Bed Mobility Overal bed mobility: Needs Assistance Bed Mobility: Supine to Sit     Supine to sit: Supervision     General bed mobility comments: supervision with vc's for precautions  Transfers Overall transfer level: Needs assistance Equipment used: Rolling walker (2 wheeled) Transfers: Sit to/from Stand Sit to Stand: Min guard         General transfer comment: min-guard for safety  Ambulation/Gait Ambulation/Gait assistance: Min guard Ambulation Distance (Feet): 100 Feet Assistive  device: Rolling walker (2 wheeled) Gait Pattern/deviations: Step-through pattern;Decreased weight shift to right;Decreased stance time - right;Trunk flexed;Antalgic Gait velocity: decreased Gait velocity interpretation: Below normal speed for age/gender General Gait Details: pt reports that she has had increased trunk flexion in standing since first surgery, this surgery hopefully corrected a bit per pt.   Stairs            Wheelchair Mobility    Modified Rankin (Stroke Patients Only)       Balance Overall balance assessment: Needs assistance Sitting-balance support: Feet supported;No upper extremity supported Sitting balance-Leahy Scale: Good     Standing balance support: No upper extremity supported;During functional activity Standing balance-Leahy Scale: Fair Standing balance comment: guarded stance, unable to accept challenge safely                             Pertinent Vitals/Pain Pain Assessment: 0-10 Pain Score: 6  Pain Location: back, right buttocks and leg Pain Intervention(s): Monitored during session    [place] expects to be discharged to:: Private residence Living Arrangements: Spouse/significant other Available Help at Discharge: Family;Available 24 hours/day Type of Home: House Home Access: Stairs to enter Entrance Stairs-Rails: Right;Left;Can reach both Entrance Stairs-Number of Steps: 4 Home Layout: One level Home Equipment: Cane - single point;Walker - 2 wheels;Bedside commode      Prior Function Level of Independence: Independent with assistive device(s)         Comments: had been using  RW last month because of back pain and RLE>LLE pain      Hand Dominance   Dominant Hand: Right  Extremity/Trunk Assessment   Upper Extremity Assessment: Overall WFL for tasks assessed           Lower Extremity Assessment: RLE deficits/detail RLE Deficits / Details: right leg still painful, hip flex 3-/5     Cervical / Trunk Assessment: Kyphotic  Communication   Communication: No difficulties  Cognition Arousal/Alertness: Awake/alert Behavior During Therapy: WFL for tasks assessed/performed Overall Cognitive Status: Within Functional Limits for tasks assessed                      General Comments      Exercises        Assessment/Plan    PT Assessment Patient needs continued PT services  PT Diagnosis Difficulty walking;Abnormality of gait;Acute pain   PT Problem List Decreased strength;Decreased activity tolerance;Decreased range of motion;Decreased balance;Decreased mobility;Decreased knowledge of precautions;Pain  PT Treatment Interventions DME instruction;Gait training;Stair training;Functional mobility training;Therapeutic activities;Therapeutic exercise;Balance training;Patient/family education   PT Goals (Current goals can be found in the Care Plan section) Acute Rehab PT Goals Patient Stated Goal: return home PT Goal Formulation: With patient Time For Goal Achievement: 08/07/14 Potential to Achieve Goals: Good    Frequency Min 5X/week   Barriers to discharge        Co-evaluation               End of Session Equipment Utilized During Treatment: Gait belt;Back brace Activity Tolerance: Patient tolerated treatment well Patient left: in chair;with call bell/phone within reach;with family/visitor present;with chair alarm set Nurse Communication: Mobility status         Time: 7741-2878 PT Time Calculation (min) (ACUTE ONLY): 29 min   Charges:   PT Evaluation $Initial PT Evaluation Tier I: 1 Procedure PT Treatments $Gait Training: 8-22 mins   PT G Codes:       Leighton Roach, PT  Acute Rehab Services  [place], [place] 07/31/2014, 11:11 AM

## 2014-07-31 NOTE — Progress Notes (Signed)
Per MD, may leave IV out. Will continue to monitor pt.

## 2014-07-31 NOTE — Evaluation (Signed)
Occupational Therapy Evaluation Patient Details Name: Kim Mcgee MRN: [id number] DOB: 09-Nov-1940 Today's Date: 07/31/2014    History of Present Illness Patient is a 74 year old individual who had surgery by Dr. Luiz Ochoa a couple of years ago to include a fusion from L2-5. She did well for a while but now has further back pain at the thoraco lumbar junction with weakness into both legs. 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 PMH: HTN, GERD. Pt prsents for fusion of L1-2   Clinical Impression   Pt admitted with above. She demonstrates the below listed deficits and will benefit from continued OT to maximize safety and independence with BADLs.  Pt is moving very well POD #1.  She is able to perform BADLs with min guard assist to min A.  Spouse is very supportive.  Will follow acutely.        Follow Up Recommendations  No OT follow up;Supervision/Assistance - 24 hour    Equipment Recommendations  None recommended by OT    Recommendations for Other Services       Precautions / Restrictions Precautions Precautions: Back Precaution Booklet Issued: Yes (comment) Precaution Comments: reviewed precautions Required Braces or Orthoses: Spinal Brace Spinal Brace: Applied in sitting position;Lumbar corset Restrictions Weight Bearing Restrictions: No      Mobility Bed Mobility Overal bed mobility: Needs Assistance Bed Mobility: Sit to Sidelying     Supine to sit: Supervision   Sit to sidelying: Min assist General bed mobility comments: min a to lift LEs onto bed and min verbal cues for precautions   Transfers Overall transfer level: Needs assistance Equipment used: Rolling walker (2 wheeled) Transfers: Sit to/from [facility] Sit to Stand: Min guard Stand pivot transfers: Min guard       General transfer comment: min-guard for safety    Balance  Overall balance assessment: Needs assistance Sitting-balance support: Feet supported Sitting balance-Leahy Scale: Good     Standing balance support: No upper extremity supported Standing balance-Leahy Scale: Fair Standing balance comment: guarded stance, unable to accept challenge safely                            ADL Overall ADL's : Needs assistance/impaired Eating/Feeding: Independent;Sitting   Grooming: Wash/dry hands;Wash/dry face;Oral care;Brushing hair;Min guard;Standing   Upper Body Bathing: Set up;Supervision/ safety;Sitting   Lower Body Bathing: Sit to/from stand;Min guard   Upper Body Dressing : Set up;Supervision/safety;Sitting   Lower Body Dressing: Min guard;Sit to/from stand Lower Body Dressing Details (indicate cue type and reason): Pt is able to don/doff socks by crossing ankles over knees.  Requires min guard assist for balance  Toilet Transfer: Min guard;Ambulation;Comfort height toilet;Regular Toilet;BSC;RW   Toileting- Clothing Manipulation and Hygiene: Minimal assistance;Sit to/from stand       Functional mobility during ADLs: Min guard;Rolling walker General ADL Comments: Pt requires min cues to recall back precautions.  Spouse is very supportive.  She has all DME and AE at home      Vision     Perception     Praxis      Pertinent Vitals/Pain Pain Assessment: No/denies pain Pain Score: 2  Pain Location: back  Pain Intervention(s): Monitored during session     Hand Dominance Right   Extremity/Trunk Assessment Upper Extremity Assessment Upper Extremity Assessment: Overall WFL for tasks assessed   Lower Extremity Assessment Lower Extremity  Assessment: Defer to PT evaluation RLE Deficits / Details: right leg still painful, hip flex 3-/5   Cervical / Trunk Assessment Cervical / Trunk Assessment: Kyphotic   Communication Communication Communication: No difficulties   Cognition Arousal/Alertness: Awake/alert Behavior During  Therapy: WFL for tasks assessed/performed Overall Cognitive Status: Within Functional Limits for tasks assessed                     General Comments       Exercises       Shoulder Instructions      Home Living Family/patient expects to be discharged to:: Private residence Living Arrangements: Spouse/significant other Available Help at Discharge: Family;Available 24 hours/day Type of Home: House Home Access: Stairs to enter [employer] of Steps: 4 Entrance Stairs-Rails: Right;Left;Can reach both Home Layout: One level     Bathroom Shower/Tub: Occupational psychologist: Standard Bathroom Accessibility: Yes How Accessible: Accessible via walker Home Equipment: Cane - single point;Walker - 2 wheels;Bedside commode;Shower seat;Adaptive equipment Adaptive Equipment: Reacher;Sock aid;Long-handled shoe horn;Long-handled sponge        Prior Functioning/Environment Level of Independence: Independent with assistive device(s)        Comments: had been using  RW last month because of back pain and RLE>LLE pain     OT Diagnosis: Generalized weakness;Acute pain   OT Problem List: Decreased strength;Decreased activity tolerance;Impaired balance (sitting and/or standing);Decreased safety awareness;Decreased knowledge of use of DME or AE;Decreased knowledge of precautions;Pain   OT Treatment/Interventions: Self-care/ADL training;DME and/or AE instruction;Therapeutic activities;Patient/family education;Balance training    OT Goals(Current goals can be found in the care plan section) Acute Rehab OT Goals Patient Stated Goal: return home OT Goal Formulation: With patient/family Time For Goal Achievement: 08/07/14 Potential to Achieve Goals: Good ADL Goals Pt Will Perform Grooming: with modified independence;standing Pt Will Perform Upper Body Bathing: with modified independence;sitting Pt Will Perform Lower Body Bathing: with modified independence;sit  to/from stand Pt Will Perform Upper Body Dressing: with modified independence;sitting Pt Will Perform Lower Body Dressing: with modified independence;sit to/from stand Pt Will Transfer to Toilet: with modified independence;ambulating;regular height toilet;grab bars Pt Will Perform Toileting - Clothing Manipulation and hygiene: with modified independence;sit to/from stand Pt Will Perform Tub/Shower Transfer: Shower transfer;with modified independence;ambulating;shower seat;rolling walker  OT Frequency: Min 2X/week   Barriers to D/C:            Co-evaluation              End of Session Equipment Utilized During Treatment: Rolling walker;Back brace Nurse Communication: Mobility status  Activity Tolerance: Patient tolerated treatment well Patient left: in bed;with call bell/phone within reach;with bed alarm set;with family/visitor present   Time: 3500-9381 OT Time Calculation (min): 23 min Charges:  OT General Charges $OT Visit: 1 Procedure OT Evaluation $Initial OT Evaluation Tier I: 1 Procedure OT Treatments $Self Care/Home Management : 8-22 mins G-Codes:    Jurnie Garritano M 2014-08-04, 1:45 PM

## 2014-07-31 NOTE — Progress Notes (Addendum)
Patient ID: Kim Mcgee, female   DOB: 1941/03/20, 74 y.o.   MRN: [id number] Vital signs are stable Incision is clean and dry Motor function is intact Starting to mobilize. Bowels have not moved as of yet Catheter has been removed. Hopeful for discharge before this weekend. In my absence I've asked Dr. Hal Neer to follow-up this patient.

## 2014-08-01 NOTE — Progress Notes (Signed)
Physical Therapy Treatment Patient Details Name: Kim Mcgee MRN: [id number] DOB: 1940/10/31 Today's Date: 08/01/2014    History of Present Illness Patient is a 74 year old individual who had surgery by Dr. Luiz Ochoa a couple of years ago to include a fusion from L2-5. She did well for a while but now has further back pain at the thoraco lumbar junction with weakness into both legs. 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 PMH: HTN, GERD. Pt prsents for fusion of L1-2    PT Comments    Patient making great progress today and is motivated to progress. Patient will need to attempt stairs prior to DC home. Will continue with current POC  Follow Up Recommendations  Home health PT;Supervision - Intermittent     Equipment Recommendations  None recommended by PT    Recommendations for Other Services       Precautions / Restrictions Precautions Precautions: Back Precaution Comments: reviewed precautions and handout with patient and husband Required Braces or Orthoses: Spinal Brace Spinal Brace: Applied in sitting position;Lumbar corset    Mobility  Bed Mobility               General bed mobility comments: Patient up in rest room with nursing staff upon entering.   Transfers Overall transfer level: Needs assistance Equipment used: Rolling walker (2 wheeled)   Sit to Stand: Min guard         General transfer comment: Minguard for safety. Patient with safe hand placement  Ambulation/Gait Ambulation/Gait assistance: Min guard Ambulation Distance (Feet): 200 Feet Assistive device: Rolling walker (2 wheeled) Gait Pattern/deviations: Step-through pattern;Decreased stride length;Trunk flexed   Gait velocity interpretation: at or above normal speed for age/gender General Gait Details: Cues for upright posture and to increase weight through LEs as she tends to lean  into RW   Stairs            Wheelchair Mobility    Modified Rankin (Stroke Patients Only)       Balance                                    Cognition Arousal/Alertness: Awake/alert Behavior During Therapy: WFL for tasks assessed/performed Overall Cognitive Status: Within Functional Limits for tasks assessed                      Exercises      General Comments        Pertinent Vitals/Pain Pain Assessment: No/denies pain    Home Living                      Prior Function            PT Goals (current goals can now be found in the care plan section) Progress towards PT goals: Progressing toward goals    Frequency  Min 5X/week    PT Plan Current plan remains appropriate    Co-evaluation             End of Session Equipment Utilized During Treatment: Back brace Activity Tolerance: Patient tolerated treatment well Patient left: in chair;with call bell/phone within reach;with chair alarm set;with family/visitor present     Time: 6195-0932 PT Time Calculation (min) (ACUTE ONLY): 17 min  Charges:  $Gait Training: 8-22 mins  G Codes:      Jacqualyn Posey 08/01/2014, 10:26 AM  08/01/2014 Jacqualyn Posey PTA [phone] pager [phone] office

## 2014-08-01 NOTE — Progress Notes (Signed)
Patient ID: Kim Mcgee, female   DOB: September 07, 1940, 74 y.o.   MRN: [id number] Afeb, vss Feeling slowly better Wound clean and dry Thinks she will be ready for d/c on Saturday. Will take things a day at a time.

## 2014-08-02 MED ORDER — BISACODYL 5 MG PO TBEC
5.0000 mg | DELAYED_RELEASE_TABLET | Freq: Every day | ORAL | Status: DC | PRN
  Administered 2014-08-02: 5 mg via ORAL
  Filled 2014-08-02: qty 1

## 2014-08-02 MED ORDER — DIPHENHYDRAMINE HCL 50 MG/ML IJ SOLN: 12.5000 mg | Freq: Four times a day (QID) | INTRAMUSCULAR | Status: DC | PRN

## 2014-08-02 MED ORDER — LORAZEPAM 1 MG PO TABS
1.0000 mg | ORAL_TABLET | Freq: Four times a day (QID) | ORAL | Status: DC | PRN
  Administered 2014-08-02 – 2014-08-03 (×3): 1 mg via ORAL
  Filled 2014-08-02 (×3): qty 1

## 2014-08-02 MED ORDER — DIPHENHYDRAMINE HCL 25 MG PO CAPS
25.0000 mg | ORAL_CAPSULE | Freq: Four times a day (QID) | ORAL | Status: DC | PRN
  Administered 2014-08-02: 25 mg via ORAL
  Filled 2014-08-02: qty 1

## 2014-08-02 NOTE — Progress Notes (Signed)
Occupational Therapy Treatment Patient Details Name: Kim Mcgee MRN: [id number] DOB: May 24, 1941 Today's Date: 08/02/2014    History of present illness Patient is a 74 year old individual who had surgery by Dr. Luiz Ochoa a couple of years ago to include a fusion from L2-5. She did well for a while but now has further back pain at the thoraco lumbar junction with weakness into both legs. 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 PMH: HTN, GERD. Pt prsents for fusion of L1-2   OT comments  Completed education on back precautions regarding ADL/functional mobility for ADL with use of AE/DME. Pt/family demonstrated understanding. Pt ready to D/C home with 24/7 supervision when medically stable. OT signing off.  Follow Up Recommendations  No OT follow up;Supervision/Assistance - 24 hour    Equipment Recommendations  None recommended by OT    Recommendations for Other Services      Precautions / Restrictions Precautions Precautions: Back Precaution Comments: Patient able to recall precautions Required Braces or Orthoses: Spinal Brace Spinal Brace: Applied in sitting position;Lumbar corset       Mobility Bed Mobility Overal bed mobility: Needs Assistance Bed Mobility: Sidelying to Sit   Sidelying to sit: Supervision       General bed mobility comments: educated pt on proper technique for bed mobility. Pt verbalized understanding.  Transfers Overall transfer level: Needs assistance Equipment used: Rolling walker (2 wheeled) Transfers: Sit to/from Stand Sit to Stand: Supervision Stand pivot transfers: Supervision       General transfer comment: Cued for hand placement on chair when sitting    Balance Overall balance assessment: Needs assistance           Standing balance-Leahy Scale: Fair                     ADL Overall ADL's : Needs  assistance/impaired     Grooming: Supervision/safety Grooming Details (indicate cue type and reason): educated pt on compensatory strategies for grooming     Lower Body Bathing: Supervison/ safety;Adhering to back precautions;Sit to/from stand   Upper Body Dressing : Set up;Supervision/safety;Sitting Upper Body Dressing Details (indicate cue type and reason): Pt independently donned lumbar corsett Lower Body Dressing: Supervision/safety;Sit to/from stand;Adhering to back precautions   Toilet Transfer: Supervision/safety;Ambulation (simulated with recliner)   Toileting- Clothing Manipulation and Hygiene: Set up;Supervision/safety;Sit to/from stand (educated pt on use of toilet tongs)         General ADL Comments: educated pt on compensatory strategies for ADLs, pt returned demonstration of techniques. Husband was present. All questions answered                                      Cognition   Behavior During Therapy: WFL for tasks assessed/performed Overall Cognitive Status: Within Functional Limits for tasks assessed                                                       Pertinent Vitals/ Pain       Pain Assessment: 0-10 Pain Score: 5  Pain Location: back Pain Descriptors / Indicators: Aching Pain Intervention(s): Limited activity within patient's tolerance;Monitored during session;Repositioned  Home Living  Prior Functioning/Environment              Frequency       Progress Toward Goals  OT Goals(current goals can now be found in the care plan section)  Progress towards OT goals: Goals met/education completed, patient discharged from OT (Appropriate for discharge)  Acute Rehab OT Goals Patient Stated Goal: return home OT Goal Formulation: With patient/family Time For Goal Achievement: 08/07/14 Potential to Achieve Goals: Good ADL Goals Pt Will Perform Grooming:  with modified independence;standing Pt Will Perform Upper Body Bathing: with modified independence;sitting Pt Will Perform Lower Body Bathing: with modified independence;sit to/from stand Pt Will Perform Upper Body Dressing: with modified independence;sitting Pt Will Perform Lower Body Dressing: with modified independence;sit to/from stand Pt Will Transfer to Toilet: with modified independence;ambulating;regular height toilet;grab bars Pt Will Perform Toileting - Clothing Manipulation and hygiene: with modified independence;sit to/from stand Pt Will Perform Tub/Shower Transfer: Shower transfer;with modified independence;ambulating;shower seat;rolling walker  Plan Discharge plan remains appropriate;All goals met and education completed, patient discharged from OT services  Goals appropriate for D/C home with 24/7 S.                    End of Session Equipment Utilized During Treatment: Gait belt;Rolling walker;Back brace   Activity Tolerance Patient tolerated treatment well   Patient Left in chair;with call bell/phone within reach;with family/visitor present   Nurse Communication Mobility status        Time: 6950-7225 OT Time Calculation (min): 37 min  Charges: OT General Charges $OT Visit: 1 Procedure OT Treatments $Self Care/Home Management : 23-37 mins  Sonny Poth,HILLARY 08/02/2014, 3:30 PM   [facility], OTR/L  [phone] 08/02/2014

## 2014-08-02 NOTE — Progress Notes (Signed)
Patient appears very anxious.RN noted patient left cheek and nose flushed with shaking hands. MD (Dr. Hal Neer) aware of situation. Benadryl order and given. Patient vebalized this had happened before but went away. Staffs at bedside. Emotional support given. VSS. Pain medication administered. Patient is comfortable at this time. Will continue to monitor.  Ave Filter, RN

## 2014-08-02 NOTE — Progress Notes (Signed)
Talked to patient about DCP/ [facility] choices, patient lives in [place], [place], request [facility]; Attending MD Dr Hal Neer in agreement for HHPT ; patient also request a cane at discharge; Attending MD please enter face to face document Medicare requirement for [facility] services; Aneta Mins [phone]

## 2014-08-02 NOTE — Progress Notes (Signed)
Patient ID: Kim Mcgee, female   DOB: February 28, 1941, 74 y.o.   MRN: [id number] Afeb, vss No new neuro issues Did have an episode this morning that was either allergic vs anxiety. She takes an anti anxiety medicine that she has not been taking up here. Will resume that. Also she has not had a bowel movement so will give laxative.

## 2014-08-02 NOTE — Progress Notes (Signed)
Physical Therapy Treatment Patient Details Name: Kim Mcgee MRN: [id number] DOB: 06-Feb-1941 Today's Date: 08/02/2014    History of Present Illness Patient is a 74 year old individual who had surgery by Dr. Luiz Ochoa a couple of years ago to include a fusion from L2-5. She did well for a while but now has further back pain at the thoraco lumbar junction with weakness into both legs. 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 PMH: HTN, GERD. Pt prsents for fusion of L1-2    PT Comments    Patient progressing well. Required one seated rest break with ambulation. Able to complete stair training this AM. Patient safe to D/C from a mobility standpoint based on progression towards goals set on PT eval.    Follow Up Recommendations  Home health PT;Supervision - Intermittent     Equipment Recommendations  None recommended by PT    Recommendations for Other Services       Precautions / Restrictions Precautions Precautions: Back Precaution Comments: Patient able to recall precautions Required Braces or Orthoses: Spinal Brace Spinal Brace: Applied in sitting position;Lumbar corset    Mobility  Bed Mobility Overal bed mobility: Needs Assistance           Sit to sidelying: Min assist General bed mobility comments: A for LEs back into bed. CUes for log roll  Transfers Overall transfer level: Needs assistance Equipment used: Rolling walker (2 wheeled)   Sit to Stand: Supervision         General transfer comment: Patient with correct technique  Ambulation/Gait Ambulation/Gait assistance: Supervision Ambulation Distance (Feet): 400 Feet Assistive device: Rolling walker (2 wheeled) Gait Pattern/deviations: Step-through pattern;Decreased stride length Gait velocity: decreased   General Gait Details: Cues for upright posture and to increase weight through LEs as she  tends to lean into RW   Stairs Stairs: Yes Stairs assistance: Min guard Stair Management: Step to pattern;Forwards;One rail Left Number of Stairs: 5 General stair comments: Patient cues for technique.   Wheelchair Mobility    Modified Rankin (Stroke Patients Only)       Balance                                    Cognition Arousal/Alertness: Awake/alert Behavior During Therapy: WFL for tasks assessed/performed Overall Cognitive Status: Within Functional Limits for tasks assessed                      Exercises      General Comments        Pertinent Vitals/Pain Pain Assessment: No/denies pain Pain Score: 5  Pain Location: back Pain Intervention(s): Premedicated before session    Home Living                      Prior Function            PT Goals (current goals can now be found in the care plan section) Progress towards PT goals: Progressing toward goals    Frequency  Min 5X/week    PT Plan Current plan remains appropriate    Co-evaluation             End of Session Equipment Utilized During Treatment: Back brace Activity Tolerance: Patient tolerated treatment well Patient left: in bed;with call bell/phone within reach     Time:  6222-9798 PT Time Calculation (min) (ACUTE ONLY): 23 min  Charges:  $Gait Training: 8-22 mins $Therapeutic Activity: 8-22 mins                    G Codes:      Jacqualyn Posey 08/02/2014, 9:06 AM  08/02/2014 Jacqualyn Posey PTA [phone] pager [phone] office

## 2014-08-03 MED ORDER — OXYCODONE-ACETAMINOPHEN 5-325 MG PO TABS: 1.0000 | ORAL_TABLET | Freq: Four times a day (QID) | ORAL | Status: DC | PRN

## 2014-08-03 MED ORDER — CYCLOBENZAPRINE HCL 10 MG PO TABS: 10.0000 mg | ORAL_TABLET | Freq: Three times a day (TID) | ORAL | Status: DC | PRN

## 2014-08-03 MED ORDER — FLEET ENEMA 7-19 GM/118ML RE ENEM
1.0000 | ENEMA | Freq: Once | RECTAL | Status: AC
  Administered 2014-08-03: 1 via RECTAL

## 2014-08-03 NOTE — Discharge Summary (Signed)
Physician Discharge Summary  Patient ID: Kim Mcgee MRN: [id number] DOB/AGE: [birth year] 74 y.o.  Admit date: 07/30/2014 Discharge date: 08/03/2014  Admission Diagnoses: Adjacent level stenosis    Discharge Diagnoses: Same   Discharged Condition: good  Hospital Course: The patient was admitted on 07/30/2014 and taken to the operating room where the patient underwent lumbar decompression and fusion. The patient tolerated the procedure well and was taken to the recovery room and then to the floor in stable condition. The hospital course was routine. There were no complications. The wound remained clean dry and intact. Pt had appropriate back soreness. No complaints of leg pain or new N/T/W. The patient remained afebrile with stable vital signs, and tolerated a regular diet. The patient continued to increase activities, and pain was well controlled with oral pain medications.   Consults: None  Significant Diagnostic Studies:  Results for orders placed or performed during the hospital encounter of 07/22/14  Surgical pcr screen  Result Value Ref Range   MRSA, PCR NEGATIVE NEGATIVE   Staphylococcus aureus POSITIVE (A) NEGATIVE  Basic metabolic panel  Result Value Ref Range   Sodium 142 135 - 145 mmol/L   Potassium 4.0 3.5 - 5.1 mmol/L   Chloride 104 96 - 112 mmol/L   CO2 31 19 - 32 mmol/L   Glucose, Bld 96 70 - 99 mg/dL   BUN 9 6 - 23 mg/dL   Creatinine, Ser 0.66 0.50 - 1.10 mg/dL   Calcium 9.9 8.4 - 10.5 mg/dL   GFR calc non Af Amer 86 (L) >90 mL/min   GFR calc Af Amer >90 >90 mL/min   Anion gap 7 5 - 15  CBC  Result Value Ref Range   WBC 7.3 4.0 - 10.5 K/uL   RBC 4.70 3.87 - 5.11 MIL/uL   Hemoglobin 13.2 12.0 - 15.0 g/dL   HCT 40.7 36.0 - 46.0 %   MCV 86.6 78.0 - 100.0 fL   MCH 28.1 26.0 - 34.0 pg   MCHC 32.4 30.0 - 36.0 g/dL   RDW 13.3 11.5 - 15.5 %   Platelets 268 150 - 400 K/uL  Type and screen  Result Value Ref Range   ABO/RH(D) A POS    Antibody Screen NEG    Sample  Expiration 08/05/2014     Dg Lumbar Spine 2-3 Views  07/30/2014   CLINICAL DATA:  Status post posterior fusion of lumbar spine.  EXAM: DG C-ARM 61-120 MIN; LUMBAR SPINE - 2-3 VIEW  TECHNIQUE: Two intraoperative fluoroscopic images were obtained of the lumbar spine.  CONTRAST:  None.  FLUOROSCOPY TIME:  Radiation Exposure Index (as provided by the fluoroscopic device): Not given.  If the device does not provide the exposure index:  Fluoroscopy Time (in minutes and seconds):  25 seconds  Number of Acquired Images:  2  COMPARISON:  MRI of March 22, 2014.  FINDINGS: Status post surgical posterior fusion of L1-2 with bilateral intrapedicular screw placement and interbody fusion. Previous fusion of lower lumbar spine is again noted. Good alignment of vertebral bodies is noted.  IMPRESSION: Status post surgical posterior fusion of L1-2.   Electronically Signed   By: Marijo Conception, M.D.   On: 07/30/2014 11:54   Dg C-arm 1-60 Min  07/30/2014   CLINICAL DATA:  Status post posterior fusion of lumbar spine.  EXAM: DG C-ARM 61-120 MIN; LUMBAR SPINE - 2-3 VIEW  TECHNIQUE: Two intraoperative fluoroscopic images were obtained of the lumbar spine.  CONTRAST:  None.  FLUOROSCOPY TIME:  Radiation Exposure Index (as provided by the fluoroscopic device): Not given.  If the device does not provide the exposure index:  Fluoroscopy Time (in minutes and seconds):  25 seconds  Number of Acquired Images:  2  COMPARISON:  MRI of March 22, 2014.  FINDINGS: Status post surgical posterior fusion of L1-2 with bilateral intrapedicular screw placement and interbody fusion. Previous fusion of lower lumbar spine is again noted. Good alignment of vertebral bodies is noted.  IMPRESSION: Status post surgical posterior fusion of L1-2.   Electronically Signed   By: Marijo Conception, M.D.   On: 07/30/2014 11:54    Antibiotics:  Anti-infectives    Start     Dose/Rate Route Frequency Ordered Stop   07/30/14 1600  ceFAZolin (ANCEF) IVPB 1 g/50  mL premix     1 g 100 mL/hr over 30 Minutes Intravenous Every 8 hours 07/30/14 1330 07/31/14 0025   07/30/14 0823  bacitracin 50,000 Units in sodium chloride irrigation 0.9 % 500 mL irrigation  Status:  Discontinued       As needed 07/30/14 0823 07/30/14 1146   07/30/14 0600  vancomycin (VANCOCIN) IVPB 1000 mg/200 mL premix     1,000 mg 200 mL/hr over 60 Minutes Intravenous On call to O.R. 07/29/14 1355 07/30/14 0830      Discharge Exam: Blood pressure 147/80, pulse 80, temperature 98.6 F (37 C), temperature source Oral, resp. rate 18, height 5\' 2"  (1.575 m), weight 166 lb (75.297 kg), SpO2 94 %. Neurologic: Grossly normal Incision okay  Discharge Medications:     Medication List    STOP taking these medications        meloxicam 15 MG tablet  Commonly known as:  MOBIC     traMADol 50 MG tablet  Commonly known as:  ULTRAM      TAKE these medications        albuterol 108 (90 BASE) MCG/ACT inhaler  Commonly known as:  PROVENTIL HFA;VENTOLIN HFA  Inhale 2 puffs into the lungs every 6 (six) hours as needed. For shortness of breath     bethanechol 25 MG tablet  Commonly known as:  URECHOLINE  Take 1 tablet (25 mg total) by mouth 3 (three) times daily.     cyclobenzaprine 10 MG tablet  Commonly known as:  FLEXERIL  Take 1 tablet (10 mg total) by mouth 3 (three) times daily as needed for muscle spasms. For pain     dexamethasone 0.75 MG tablet  Commonly known as:  DECADRON  2 tablets twice daily for 2 days, one tablet twice daily for 2 days, one tablet daily for 2 days.     digoxin 0.125 MG tablet  Commonly known as:  LANOXIN  Take 0.125 mg by mouth daily after breakfast.     Fluticasone-Salmeterol 500-50 MCG/DOSE Aepb  Commonly known as:  ADVAIR  Inhale 1 puff into the lungs 2 (two) times daily as needed (shortness of breath).     levothyroxine 125 MCG tablet  Commonly known as:  SYNTHROID, LEVOTHROID  Take 125 mcg by mouth daily before breakfast.      methocarbamol 500 MG tablet  Commonly known as:  ROBAXIN  Take 1 tablet (500 mg total) by mouth every 6 (six) hours as needed for muscle spasms.     metoprolol succinate 25 MG 24 hr tablet  Commonly known as:  TOPROL-XL  Take 12.5 mg by mouth daily after breakfast.     omeprazole 20 MG capsule  Commonly known as:  PRILOSEC  Take 20 mg by mouth daily with breakfast.     oxazepam 10 MG capsule  Commonly known as:  SERAX  Take 10 mg by mouth at bedtime as needed.     oxyCODONE-acetaminophen 5-325 MG per tablet  Commonly known as:  PERCOCET/ROXICET  Take 1-2 tablets by mouth every 4 (four) hours as needed.     oxyCODONE-acetaminophen 5-325 MG per tablet  Commonly known as:  PERCOCET/ROXICET  Take 1-2 tablets by mouth every 4 (four) hours as needed for moderate pain.     oxyCODONE-acetaminophen 5-325 MG per tablet  Commonly known as:  PERCOCET/ROXICET  Take 1-2 tablets by mouth every 6 (six) hours as needed for moderate pain.     polyethylene glycol packet  Commonly known as:  MIRALAX / GLYCOLAX  Take 17 g by mouth daily.     pregabalin 75 MG capsule  Commonly known as:  LYRICA  Take 1 capsule (75 mg total) by mouth 2 (two) times daily.     tamsulosin 0.4 MG Caps capsule  Commonly known as:  FLOMAX  Take 1 capsule (0.4 mg total) by mouth daily.     verapamil 240 MG CR tablet  Commonly known as:  CALAN-SR  Take 240 mg by mouth daily with breakfast.        Disposition: Home   Final Dx: Lumbar decompression and fusion      Discharge Instructions    Call MD for:  difficulty breathing, headache or visual disturbances    Complete by:  As directed      Call MD for:  persistant nausea and vomiting    Complete by:  As directed      Call MD for:  redness, tenderness, or signs of infection (pain, swelling, redness, odor or green/yellow discharge around incision site)    Complete by:  As directed      Call MD for:  severe uncontrolled pain    Complete by:  As directed       Call MD for:  temperature >100.4    Complete by:  As directed      Diet - low sodium heart healthy    Complete by:  As directed      Discharge instructions    Complete by:  As directed   May shower, no strenuous activity, no bending or twisting, no heavy lifting, no driving     Increase activity slowly    Complete by:  As directed            Follow-up Information    Follow up with Earleen Newport, MD. Schedule an appointment as soon as possible for a visit in 2 weeks.   Specialty:  Neurosurgery   Contact information:   [address] [place] [address] [phone]        Signed: [name] 08/03/2014, 9:13 AM

## 2014-08-03 NOTE — Progress Notes (Signed)
Physical Therapy Treatment Patient Details Name: Kim Mcgee MRN: [id number] DOB: [birth year] Today's Date: 08/03/2014    History of Present Illness      PT Comments    Pt with probable d/c home today.  She is awaiting a BM since she has yet to have one since admission.  Follow Up Recommendations  Home health PT;Supervision - Intermittent     Equipment Recommendations  None recommended by PT    Recommendations for Other Services       Precautions / Restrictions Precautions Precautions: Back Precaution Comments: Pt independently recalled 3/3 back precautions. Required Braces or Orthoses: Spinal Brace Spinal Brace: Lumbar corset;Applied in sitting position    Mobility  Bed Mobility       Sidelying to sit: Supervision     Sit to sidelying: Min guard    Transfers   Equipment used: Rolling walker (2 wheeled)   Sit to Stand: Supervision Stand pivot transfers: Supervision       General transfer comment: verbal cues for hand placement  Ambulation/Gait Ambulation/Gait assistance: Supervision Ambulation Distance (Feet): 600 Feet Assistive device: Rolling walker (2 wheeled) Gait Pattern/deviations: Step-through pattern;Decreased stride length Gait velocity: mildly decreased   General Gait Details: verbal cues for posture and to bring RW closer   Stairs            Wheelchair Mobility    Modified Rankin (Stroke Patients Only)       Balance                                    Cognition Arousal/Alertness: Awake/alert Behavior During Therapy: WFL for tasks assessed/performed Overall Cognitive Status: Within Functional Limits for tasks assessed                      Exercises      General Comments        Pertinent Vitals/Pain Pain Assessment: 0-10 Pain Score: 4  Pain Location: back Pain Intervention(s): Monitored during session    Home Living                      Prior Function            PT Goals  (current goals can now be found in the care plan section) Progress towards PT goals: Progressing toward goals    Frequency  Min 5X/week    PT Plan Current plan remains appropriate    Co-evaluation             End of Session Equipment Utilized During Treatment: Back brace;Gait belt Activity Tolerance: Patient tolerated treatment well Patient left: in bed;with family/visitor present;with call bell/phone within reach;with bed alarm set     Time: 9622-2979 PT Time Calculation (min) (ACUTE ONLY): 27 min  Charges:  $Gait Training: 23-37 mins                    G Codes:      Kim Mcgee 08/03/2014, 1:53 PM

## 2014-08-04 NOTE — Care Management Note (Signed)
    Page 1 of 2   08/04/2014     2:06:15 PM CARE MANAGEMENT NOTE 08/04/2014  Patient:  Kim Mcgee, Kim Mcgee   Account Number:  [id number]  Date Initiated:  08/02/2014  Documentation initiated by:  Olga Coaster  Subjective/Objective Assessment:   ADMITTED FOR SURGERY     Action/Plan:   HOME WITH HHC SERVICES   Anticipated DC Date:  08/04/2014   Anticipated DC Plan:  [place]  CM consult      Choice offered to / List presented to:     DME arranged  [facility]      DME agency  [place] arranged  HH-2 PT      [facility] agency  [facility]   Status of service:  Completed, signed off Medicare Important Message given?  YES (If response is "NO", the following Medicare IM given date fields will be blank) Date Medicare IM given:  08/02/2014 Medicare IM given by:  Olga Coaster Date Additional Medicare IM given:   Additional Medicare IM given by:    Discharge Disposition:  [place]  Per UR Regulation:  Reviewed for med. necessity/level of care/duration of stay  If discussed at [place] of Stay Meetings, dates discussed:    Comments:  08/04/14 CM called [place] to notify of pt discharge [phone]; CM faxed facesheet, F2F, HHPT order, H&P, DC summary, OP note and PT EVAL  to Fax [phone].  RN states DME in room.  No other Cm needs were communicated.  Mariane Masters, BSN, [place] [phone].   08/02/2014- Talked to patient about [facility]/ [place] choices, patient lives in [place], [place], request [facility]; Attending MD Dr Hal Neer in agreement for HHPT ; patient also request a cane at discharge; Attending MD please enter face to face document Medicare requirement for [facility] services; Aneta Mins [phone]

## 2014-08-04 NOTE — Discharge Summary (Signed)
Physician Discharge Summary  Patient ID: Kim Mcgee MRN: [id number] DOB/AGE: 74-Jun-1942 74 y.o.  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 Status post arthrodesis L2-L5, in 2013 Active Problems:   Lumbar spinal stenosis   Discharged Condition: good  Hospital Course: Patient admitted by Dr. Ellene Route, who performed an L1-2 lumbar decompression and arthrodesis. She's made steady progress. She is up and ambulating actively. She was treated with physical therapy and occupational therapy postoperatively. Her wound is healing nicely, there is no swelling, erythema, or drainage. She is asking to be discharged to home. She's been given instructions regarding wound care and activities following discharge. She is to return for follow-up with Dr. Ellene Route in a couple of weeks.  Consults:  Physical therapy, occupational therapy  Discharge Exam: Blood pressure 155/68, pulse 66, temperature 97.9 F (36.6 C), temperature source Oral, resp. rate 18, height 5\' 2"  (1.575 m), weight 75.297 kg (166 lb), SpO2 95 %.  Disposition: Home  Discharge Instructions    Call MD for:  difficulty breathing, headache or visual disturbances    Complete by:  As directed      Call MD for:  persistant nausea and vomiting    Complete by:  As directed      Call MD for:  redness, tenderness, or signs of infection (pain, swelling, redness, odor or green/yellow discharge around incision site)    Complete by:  As directed      Call MD for:  severe uncontrolled pain    Complete by:  As directed      Call MD for:  temperature >100.4    Complete by:  As directed      Diet - low sodium heart healthy    Complete by:  As directed       Discharge instructions    Complete by:  As directed   May shower, no strenuous activity, no bending or twisting, no heavy lifting, no driving     Increase activity slowly    Complete by:  As directed             Medication List    STOP taking these medications        meloxicam 15 MG tablet  Commonly known as:  MOBIC     traMADol 50 MG tablet  Commonly known as:  ULTRAM      TAKE these medications        albuterol 108 (90 BASE) MCG/ACT inhaler  Commonly known as:  PROVENTIL HFA;VENTOLIN HFA  Inhale 2 puffs into the lungs every 6 (six) hours as needed. For shortness of breath     bethanechol 25 MG tablet  Commonly known as:  URECHOLINE  Take 1 tablet (25 mg total) by mouth 3 (three) times daily.     cyclobenzaprine 10 MG tablet  Commonly known as:  FLEXERIL  Take 1 tablet (10 mg total) by mouth 3 (three) times daily as needed for muscle spasms. For pain     dexamethasone 0.75 MG tablet  Commonly known as:  DECADRON  2 tablets twice daily for 2 days, one tablet twice daily for 2 days, one tablet daily for 2 days.     digoxin 0.125 MG tablet  Commonly known as:  LANOXIN  Take 0.125 mg by mouth  daily after breakfast.     Fluticasone-Salmeterol 500-50 MCG/DOSE Aepb  Commonly known as:  ADVAIR  Inhale 1 puff into the lungs 2 (two) times daily as needed (shortness of breath).     levothyroxine 125 MCG tablet  Commonly known as:  SYNTHROID, LEVOTHROID  Take 125 mcg by mouth daily before breakfast.     methocarbamol 500 MG tablet  Commonly known as:  ROBAXIN  Take 1 tablet (500 mg total) by mouth every 6 (six) hours as needed for muscle spasms.     metoprolol succinate 25 MG 24 hr tablet  Commonly known as:  TOPROL-XL  Take 12.5 mg by mouth daily after breakfast.     omeprazole 20 MG capsule  Commonly known as:  PRILOSEC  Take 20 mg by mouth daily with breakfast.     oxazepam 10 MG capsule  Commonly known as:  SERAX  Take 10 mg by mouth at bedtime as needed.      oxyCODONE-acetaminophen 5-325 MG per tablet  Commonly known as:  PERCOCET/ROXICET  Take 1-2 tablets by mouth every 4 (four) hours as needed.     oxyCODONE-acetaminophen 5-325 MG per tablet  Commonly known as:  PERCOCET/ROXICET  Take 1-2 tablets by mouth every 4 (four) hours as needed for moderate pain.     oxyCODONE-acetaminophen 5-325 MG per tablet  Commonly known as:  PERCOCET/ROXICET  Take 1-2 tablets by mouth every 6 (six) hours as needed for moderate pain.     polyethylene glycol packet  Commonly known as:  MIRALAX / GLYCOLAX  Take 17 g by mouth daily.     pregabalin 75 MG capsule  Commonly known as:  LYRICA  Take 1 capsule (75 mg total) by mouth 2 (two) times daily.     tamsulosin 0.4 MG Caps capsule  Commonly known as:  FLOMAX  Take 1 capsule (0.4 mg total) by mouth daily.     verapamil 240 MG CR tablet  Commonly known as:  CALAN-SR  Take 240 mg by mouth daily with breakfast.           Follow-up Information    Follow up with Earleen Newport, MD. Schedule an appointment as soon as possible for a visit in 2 weeks.   Specialty:  Neurosurgery   Contact information:   [address] [place] [address] [phone]       Signed: Hosie Spangle, MD 08/04/2014, 10:55 AM

## 2014-08-04 NOTE — Progress Notes (Signed)
Physical Therapy Treatment Patient Details Name: Kim Mcgee MRN: [id number] DOB: 04/01/1941 Today's Date: 08/04/2014    History of Present Illness Patient is a 74 year old individual who had surgery by Dr. Luiz Ochoa a couple of years ago to include a fusion from L2-5. She did well for a while but now has further back pain at the thoraco lumbar junction with weakness into both legs. 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 PMH: HTN, GERD. Pt prsents for fusion of L1-2    PT Comments    Pt moving well.  Patient safe to D/C from a mobility standpoint based on progression towards goals set on PT eval.    Follow Up Recommendations  Home health PT;Supervision - Intermittent     Equipment Recommendations  None recommended by PT    Recommendations for Other Services       Precautions / Restrictions Precautions Precautions: Back Precaution Comments: Pt independently recalled 3/3 back precautions. Required Braces or Orthoses: Spinal Brace Spinal Brace: Lumbar corset;Applied in sitting position Restrictions Weight Bearing Restrictions: No    Mobility  Bed Mobility Overal bed mobility: Needs Assistance Bed Mobility: Rolling;Sidelying to Sit Rolling: Supervision Sidelying to sit: Supervision       General bed mobility comments: VC's to reinforce log roll technique  Transfers Overall transfer level: Needs assistance Equipment used: Rolling walker (2 wheeled) Transfers: Sit to/from Stand Sit to Stand: Supervision         General transfer comment: verbal cues for hand placement  Ambulation/Gait Ambulation/Gait assistance: Supervision Ambulation Distance (Feet): 600 Feet Assistive device: Rolling walker (2 wheeled) Gait Pattern/deviations: Step-through pattern;Decreased stride length Gait velocity: mildly decreased   General Gait Details: min VC's for upright  posture & stay closer to RW but overall moves very well.     Stairs            Wheelchair Mobility    Modified Rankin (Stroke Patients Only)       Balance                                    Cognition Arousal/Alertness: Awake/alert Behavior During Therapy: WFL for tasks assessed/performed Overall Cognitive Status: Within Functional Limits for tasks assessed                      Exercises      General Comments        Pertinent Vitals/Pain Pain Assessment: 0-10 Pain Score: 4  Pain Location: back Pain Descriptors / Indicators: Discomfort Pain Intervention(s): Monitored during session;Repositioned    Home Living                      Prior Function            PT Goals (current goals can now be found in the care plan section) Acute Rehab PT Goals PT Goal Formulation: With patient Time For Goal Achievement: 08/07/14 Potential to Achieve Goals: Good Progress towards PT goals: Progressing toward goals    Frequency  Min 5X/week    PT Plan Current plan remains appropriate    Co-evaluation             End of Session Equipment Utilized During Treatment: Gait belt Activity Tolerance: Patient tolerated treatment well Patient left: in chair;with call bell/phone within reach;with family/visitor present  Time: 7893-8101 PT Time Calculation (min) (ACUTE ONLY): 23 min  Charges:  $Gait Training: 23-37 mins                    G Codes:      Sena Hitch 08/04/2014, 11:56 AM   [name] Marek, PTA [phone] 08/04/2014

## 2014-08-04 NOTE — Progress Notes (Signed)
Fleets enema administered per standing order  Pt  Had a medium B.M soft in consistency.

## 2014-08-04 NOTE — Progress Notes (Signed)
Patient Kim Mcgee home via car with husband.  DC instructions and prescriptions given and fully understood by patient and husband.  Vital signs and assessments were stable.

## 2016-04-27 ENCOUNTER — Encounter: Payer: Self-pay | Admitting: Gastroenterology

## 2016-05-10 ENCOUNTER — Ambulatory Visit: Payer: Medicare Other | Admitting: Nurse Practitioner

## 2016-12-04 ENCOUNTER — Encounter (HOSPITAL_COMMUNITY): Payer: Self-pay

## 2016-12-04 ENCOUNTER — Emergency Department (HOSPITAL_COMMUNITY)
Admission: EM | Admit: 2016-12-04 | Discharge: 2016-12-05 | Disposition: A | Discharge status: Home / Self Care | Payer: Medicare Other | Attending: Emergency Medicine | Admitting: Emergency Medicine

## 2016-12-04 DIAGNOSIS — E039 Hypothyroidism, unspecified: Secondary | ICD-10-CM | POA: Diagnosis not present

## 2016-12-04 DIAGNOSIS — J45909 Unspecified asthma, uncomplicated: Secondary | ICD-10-CM | POA: Diagnosis not present

## 2016-12-04 DIAGNOSIS — M5442 Lumbago with sciatica, left side: Secondary | ICD-10-CM | POA: Insufficient documentation

## 2016-12-04 DIAGNOSIS — I10 Essential (primary) hypertension: Secondary | ICD-10-CM | POA: Insufficient documentation

## 2016-12-04 DIAGNOSIS — Z7902 Long term (current) use of antithrombotics/antiplatelets: Secondary | ICD-10-CM | POA: Insufficient documentation

## 2016-12-04 DIAGNOSIS — Z79899 Other long term (current) drug therapy: Secondary | ICD-10-CM | POA: Insufficient documentation

## 2016-12-04 DIAGNOSIS — M5416 Radiculopathy, lumbar region: Secondary | ICD-10-CM | POA: Insufficient documentation

## 2016-12-04 DIAGNOSIS — Z7951 Long term (current) use of inhaled steroids: Secondary | ICD-10-CM | POA: Diagnosis not present

## 2016-12-04 DIAGNOSIS — M545 Low back pain: Secondary | ICD-10-CM | POA: Diagnosis present

## 2016-12-04 MED ORDER — METHOCARBAMOL 500 MG PO TABS: 500.0000 mg | ORAL_TABLET | Freq: Three times a day (TID) | ORAL | 0 refills | Status: DC | PRN

## 2016-12-04 MED ORDER — METHOCARBAMOL 1000 MG/10ML IJ SOLN
1000.0000 mg | Freq: Once | INTRAMUSCULAR | Status: AC
  Administered 2016-12-04: 1000 mg via INTRAVENOUS
  Filled 2016-12-04: qty 10

## 2016-12-04 MED ORDER — ONDANSETRON HCL 4 MG/2ML IJ SOLN
4.0000 mg | Freq: Once | INTRAMUSCULAR | Status: AC
  Administered 2016-12-04: 4 mg via INTRAVENOUS
  Filled 2016-12-04: qty 2

## 2016-12-04 MED ORDER — DEXAMETHASONE SODIUM PHOSPHATE 10 MG/ML IJ SOLN
10.0000 mg | Freq: Once | INTRAMUSCULAR | Status: AC
  Administered 2016-12-04: 10 mg via INTRAVENOUS
  Filled 2016-12-04: qty 1

## 2016-12-04 MED ORDER — HYDROCODONE-ACETAMINOPHEN 5-325 MG PO TABS: 1.0000 | ORAL_TABLET | ORAL | 0 refills | Status: DC | PRN

## 2016-12-04 MED ORDER — ONDANSETRON HCL 4 MG/2ML IJ SOLN
8.0000 mg | Freq: Once | INTRAMUSCULAR | Status: AC
  Administered 2016-12-04: 8 mg via INTRAMUSCULAR
  Filled 2016-12-04: qty 4

## 2016-12-04 MED ORDER — MORPHINE SULFATE (PF) 4 MG/ML IV SOLN
4.0000 mg | INTRAVENOUS | Status: DC | PRN
  Administered 2016-12-04: 4 mg via INTRAVENOUS
  Filled 2016-12-04: qty 1

## 2016-12-04 MED ORDER — DEXAMETHASONE 4 MG PO TABS: 4.0000 mg | ORAL_TABLET | Freq: Two times a day (BID) | ORAL | 0 refills | Status: DC

## 2016-12-04 MED ORDER — SODIUM CHLORIDE 0.9 % IV BOLUS (SEPSIS)
500.0000 mL | Freq: Once | INTRAVENOUS | Status: AC
  Administered 2016-12-04: 500 mL via INTRAVENOUS

## 2016-12-04 MED ORDER — ONDANSETRON 4 MG PO TBDP: 4.0000 mg | ORAL_TABLET | Freq: Three times a day (TID) | ORAL | 0 refills | Status: DC | PRN

## 2016-12-04 NOTE — ED Notes (Signed)
Patient reports hx chronic back pain, 2 back surgeries and 1 neck surgery - pt states that the back pain has increased gradually over the past month. Pt had MRI done which showed fluid collection - was supposed to get it drained, but MD has been on vacation. Pt states this has caused weakness in her L leg - pt has had to use a walker past couple weeks and both her and her husband are concerned for her safety at home.

## 2016-12-04 NOTE — ED Provider Notes (Signed)
Kim Mcgee Provider Note   CSN: [id number] Arrival date & time: 12/04/16  1424     History   Chief Complaint Chief Complaint  Patient presents with  . Back Pain    Kim Mcgee is a 76 y.o. female.  CC:  Back pain and leg pain  HPI:  75 showed female. Patient of Dr. [name]. History of previous back surgeries. Has had recurrence of pain over the last several weeks. Her orthopedist and [name] ordered an MRI which showed arthritis and disc disease contributing to impingement on the spinal space anteriorly at T12-L1, and L5-S1. She states that her pain has been worsening. She developed some weakness in the leg using using a walker.  Abdomen worsening today. However, she states that she became concerned because she called her surgeon's office and was told that she would not be able to be seen or discuss MRI with him until "August".  She reports no bowel or bladder incontinence or retention. She states she has a slight bit of stress incontinence that has been stable over several years without acute worsening.  Past Medical History:  Diagnosis Date  . Anxiety   . Arthritis    lumbar DDD  . Asthma   . Cancer ([name])    basal cell CA  . Dysrhythmia   . GERD (gastroesophageal reflux disease)   . Hypertension   . Hypothyroidism   . Normal cardiac stress test   . PONV (postoperative nausea and vomiting)   . Shortness of breath    /w walking long distance   . Swallowing difficulty    being seen by ENT- had endoscopy- told that its probably related to past neck surgeries     Patient Active Problem List   Diagnosis Date Noted  . Lumbar spinal stenosis 07/30/2014  . Chest pain 02/11/2012  . Spinal stenosis 02/10/2012  . Hypoxemia 02/10/2012  . H/O laminectomy 02/10/2012  . GERD (gastroesophageal reflux disease) 02/10/2012  . Vocal cord dysfunction 02/10/2012  . Shortness of breath 02/09/2012    Past Surgical History:  Procedure Laterality Date  . APPENDECTOMY     . CARDIAC CATHETERIZATION     "long time ago"   . CERVICAL FUSION    . EYE SURGERY     L eye- cataract removed  - /w IOL   . TONSILLECTOMY    . TOTAL THYROIDECTOMY     1990's  . TUBAL LIGATION      OB History    No data available       Home Medications    Prior to Admission medications   Medication Sig Start Date End Date Taking? Authorizing Provider  acetaminophen (TYLENOL) 325 MG tablet Take 325-650 mg by mouth every 6 (six) hours as needed (for pain or headaches).    Yes [provider]  albuterol (PROVENTIL HFA;VENTOLIN HFA) 108 (90 BASE) MCG/ACT inhaler Inhale 2 puffs into the lungs every 6 (six) hours as needed for shortness of breath.    Yes [provider]  clopidogrel (PLAVIX) 75 MG tablet Take 75 mg by mouth daily. 09/01/16  Yes [provider]  cyclobenzaprine (FLEXERIL) 10 MG tablet Take 1 tablet (10 mg total) by mouth 3 (three) times daily as needed for muscle spasms. For pain Patient taking differently: Take 10 mg by mouth 3 (three) times daily as needed (for muscle spasms or pain).  08/03/14  Yes [name], MD  fexofenadine (ALLEGRA) 180 MG tablet Take 180 mg by mouth daily as needed  for allergies. 10/06/09  Yes [provider]  Fluticasone-Salmeterol (ADVAIR) 500-50 MCG/DOSE AEPB Inhale 1 puff into the lungs 2 (two) times daily as needed (for seasonal "flares").    Yes [provider]  gabapentin (NEURONTIN) 300 MG capsule Take 300 mg by mouth 3 (three) times daily. 11/09/16  Yes [provider]  levothyroxine (SYNTHROID, LEVOTHROID) 112 MCG tablet Take 112 mcg by mouth daily before breakfast.   Yes [provider]  metoprolol succinate (TOPROL-XL) 50 MG 24 hr tablet Take 50 mg by mouth 2 (two) times daily. 11/11/16  Yes [provider]  nitroGLYCERIN (NITROSTAT) 0.4 MG SL tablet Place 0.4 mg under the tongue every 5 (five) minutes x 3 doses as needed for chest pain. 07/05/09  Yes [provider]  Omega-3 Fatty Acids (FISH OIL PO) Take 1 capsule by mouth daily. 05/29/10  Yes [provider]  oxazepam (SERAX) 15 MG capsule Take 15 mg by mouth at bedtime as needed for anxiety.  09/07/16  Yes [provider]  pantoprazole (PROTONIX) 40 MG tablet Take 40 mg by mouth daily before breakfast. 09/16/16  Yes [provider]  rosuvastatin (CRESTOR) 5 MG tablet Take 5 mg by mouth at bedtime.  09/15/16  Yes [provider]  Tamsulosin HCl (FLOMAX) 0.4 MG CAPS Take 1 capsule (0.4 mg total) by mouth daily. 02/23/12  Yes [name], MD  traMADol (ULTRAM) 50 MG tablet Take 50 mg by mouth every 6 (six) hours as needed for pain. 11/25/16  Yes [provider]  bethanechol (URECHOLINE) 25 MG tablet Take 1 tablet (25 mg total) by mouth 3 (three) times daily. Patient not taking: Reported on 12/04/2016 02/23/12   [name], MD  dexamethasone (DECADRON) 4 MG tablet Take 1 tablet (4 mg total) by mouth 2 (two) times daily. 12/04/16   Tanna Furry, MD  HYDROcodone-acetaminophen (NORCO/VICODIN) 5-325 MG tablet Take 1 tablet by mouth every 4 (four) hours as needed. 12/04/16   Tanna Furry, MD  methocarbamol (ROBAXIN) 500 MG tablet Take 1 tablet (500 mg total) by mouth 3 (three) times daily between meals as needed. 12/04/16   Tanna Furry, MD  ondansetron (ZOFRAN ODT) 4 MG disintegrating tablet Take 1 tablet (4 mg total) by mouth every 8 (eight) hours as needed for nausea. 12/04/16   Tanna Furry, MD  oxyCODONE-acetaminophen (PERCOCET/ROXICET) 5-325 MG per tablet Take 1-2 tablets by mouth every 4 (four) hours as needed. Patient not taking: Reported on 12/04/2016 02/17/12   [name], MD  oxyCODONE-acetaminophen (PERCOCET/ROXICET) 5-325 MG per tablet Take 1-2 tablets by mouth every 4 (four) hours as needed for moderate pain. Patient not taking: Reported on 12/04/2016 07/31/14   [name], MD  oxyCODONE-acetaminophen (PERCOCET/ROXICET) 5-325 MG per tablet Take 1-2 tablets by mouth every  6 (six) hours as needed for moderate pain. Patient not taking: Reported on 12/04/2016 08/03/14   [name], MD  polyethylene glycol [facility] / Floria Raveling) packet Take 17 g by mouth daily. Patient not taking: Reported on 12/04/2016 02/23/12   [name], MD  pregabalin (LYRICA) 75 MG capsule Take 1 capsule (75 mg total) by mouth 2 (two) times daily. Patient not taking: Reported on 12/04/2016 02/23/12   [name], MD    Family History No family history on file.  Social History Social History  Substance Use Topics  . Smoking status: Never Smoker  . Smokeless tobacco: Never Used  . Alcohol use No     Allergies   Macrodantin [nitrofurantoin]; Penicillins; and Tape  Review of Systems Review of Systems  Constitutional: Negative for appetite change, chills, diaphoresis, fatigue and fever.  HENT: Negative for mouth sores, sore throat and trouble swallowing.   Eyes: Negative for visual disturbance.  Respiratory: Negative for cough, chest tightness, shortness of breath and wheezing.   Cardiovascular: Negative for chest pain.  Gastrointestinal: Negative for abdominal distention, abdominal pain, diarrhea, nausea and vomiting.  Endocrine: Negative for polydipsia, polyphagia and polyuria.  Genitourinary: Negative for dysuria, frequency and hematuria.  Musculoskeletal: Positive for arthralgias, back pain, gait problem and myalgias.  Skin: Negative for color change, pallor and rash.  Neurological: Negative for dizziness, syncope, light-headedness and headaches.  Hematological: Does not bruise/bleed easily.  Psychiatric/Behavioral: Negative for behavioral problems and confusion.     Physical Exam Updated Vital Signs BP (!) 139/54   Pulse (!) 58   Temp 97.8 F (36.6 C) (Oral)   Resp 16   Ht 5\' 2"  (1.575 m)   Wt 74.4 kg (164 lb)   SpO2 93%   BMI 30.00 kg/m   Physical Exam  Constitutional: She is oriented to person, place, and time. She appears well-developed and well-nourished.  No distress.  HENT:  Head: Normocephalic.  Eyes: Conjunctivae are normal. Pupils are equal, round, and reactive to light. No scleral icterus.  Neck: Normal range of motion. Neck supple. No thyromegaly present.  Cardiovascular: Normal rate and regular rhythm.  Exam reveals no gallop and no friction rub.   No murmur heard. Pulmonary/Chest: Effort normal and breath sounds normal. No respiratory distress. She has no wheezes. She has no rales.  Abdominal: Soft. Bowel sounds are normal. She exhibits no distension. There is no tenderness. There is no rebound.  Musculoskeletal:  Pain left of midline near the left SI joint slightly above. Denies pain currently in the upper lumbar lower thoracic spine.  Neurological: She is alert and oriented to person, place, and time.  Is 1+ Achilles and knee jerk reflexes. 4 out of 5 strength to knee flexion extension, plantar flexion dorsiflexion of great toe and foot on the left.  Skin: Skin is warm and dry. No rash noted.  Psychiatric: She has a normal mood and affect. Her behavior is normal.     ED Treatments / Results  Labs (all labs ordered are listed, but only abnormal results are displayed) Labs Reviewed - No data to display  EKG  EKG Interpretation None       Radiology No results found.  Procedures Procedures (including critical care time)  Medications Ordered in ED Medications  morphine 4 MG/ML injection 4 mg (4 mg Intravenous Given 12/04/16 2006)  ondansetron (ZOFRAN) injection 8 mg (not administered)  methocarbamol (ROBAXIN) 1,000 mg in dextrose 5 % 50 mL IVPB (0 mg Intravenous Stopped 12/04/16 2059)  ondansetron (ZOFRAN) injection 4 mg (4 mg Intravenous Given 12/04/16 2007)  dexamethasone (DECADRON) injection 10 mg (10 mg Intravenous Given 12/04/16 2008)  sodium chloride 0.9 % bolus 500 mL (0 mLs Intravenous Stopped 12/04/16 2210)  ondansetron (ZOFRAN) injection 4 mg (4 mg Intravenous Given 12/04/16 2107)     Initial Impression / Assessment  and Plan / ED Course  I have reviewed the triage vital signs and the nursing notes.  Pertinent labs & imaging results that were available during my care of the patient were reviewed by me and considered in my medical decision making (see chart for details).     IV placed. Patient medicated. Get relief but had some rather refractory nausea after IV pain medications. This is  improving. I discussed the case with Dr. Cyndy Freeze. He states that Dr. Ellene Route will be available on Monday and that she should call the office for further instructions.   Patient without acute neurological loss. Does have some demonstrable weakness. She is ambulatory with her walker. Discharge per recommendations of Dr. Cyndy Freeze for outpatient follow-up.  Plan steroid taper, pain control, follow-up  Final Clinical Impressions(s) / ED Diagnoses   Final diagnoses:  Acute left-sided low back pain with left-sided sciatica  Lumbar radiculopathy    New Prescriptions New Prescriptions   DEXAMETHASONE (DECADRON) 4 MG TABLET    Take 1 tablet (4 mg total) by mouth 2 (two) times daily.   HYDROCODONE-ACETAMINOPHEN (NORCO/VICODIN) 5-325 MG TABLET    Take 1 tablet by mouth every 4 (four) hours as needed.   METHOCARBAMOL (ROBAXIN) 500 MG TABLET    Take 1 tablet (500 mg total) by mouth 3 (three) times daily between meals as needed.   ONDANSETRON (ZOFRAN ODT) 4 MG DISINTEGRATING TABLET    Take 1 tablet (4 mg total) by mouth every 8 (eight) hours as needed for nausea.     Tanna Furry, MD 12/04/16 2302

## 2016-12-04 NOTE — ED Triage Notes (Signed)
Pt reports lower back pain and left leg pain. She has had multiple back surgeries but the past few days her left leg has been painful to walk on and "lack of strength." No redness or swelling noted to her left lower extremity.

## 2016-12-04 NOTE — ED Notes (Signed)
MD at bedside. 

## 2016-12-04 NOTE — Discharge Instructions (Signed)
Call Dr. Ellene Route on Monday for further instructions

## 2016-12-04 NOTE — ED Notes (Signed)
Patient getting dressed, provided with crackers and ginger ale. Upon sitting up to get dressed, pt vomited. MD notified, see new orders.

## 2016-12-04 NOTE — ED Notes (Signed)
Pt given diet coke to drink per Jerene Pitch, Therapist, sports

## 2016-12-15 ENCOUNTER — Emergency Department (HOSPITAL_COMMUNITY): Payer: Medicare Other

## 2016-12-15 ENCOUNTER — Encounter (HOSPITAL_COMMUNITY)
Admission: EM | Disposition: A | Discharge status: Rehabilitation Facility | Payer: Self-pay | Source: Home / Self Care | Attending: Neurosurgery

## 2016-12-15 ENCOUNTER — Encounter (HOSPITAL_COMMUNITY): Payer: Self-pay | Admitting: *Deleted

## 2016-12-15 ENCOUNTER — Inpatient Hospital Stay (HOSPITAL_COMMUNITY)
Admission: EM | Admit: 2016-12-15 | Discharge: 2016-12-21 | DRG: 460 | Disposition: A | Discharge status: Rehabilitation Facility | Payer: Medicare Other | Attending: Neurosurgery | Admitting: Neurosurgery

## 2016-12-15 ENCOUNTER — Emergency Department (HOSPITAL_COMMUNITY): Payer: Medicare Other | Admitting: Anesthesiology

## 2016-12-15 DIAGNOSIS — G9520 Unspecified cord compression: Secondary | ICD-10-CM | POA: Diagnosis not present

## 2016-12-15 DIAGNOSIS — G9741 Accidental puncture or laceration of dura during a procedure: Secondary | ICD-10-CM | POA: Diagnosis not present

## 2016-12-15 DIAGNOSIS — Z90721 Acquired absence of ovaries, unilateral: Secondary | ICD-10-CM | POA: Diagnosis not present

## 2016-12-15 DIAGNOSIS — Z91048 Other nonmedicinal substance allergy status: Secondary | ICD-10-CM

## 2016-12-15 DIAGNOSIS — J9811 Atelectasis: Secondary | ICD-10-CM | POA: Diagnosis not present

## 2016-12-15 DIAGNOSIS — R7303 Prediabetes: Secondary | ICD-10-CM | POA: Diagnosis present

## 2016-12-15 DIAGNOSIS — E039 Hypothyroidism, unspecified: Secondary | ICD-10-CM | POA: Diagnosis present

## 2016-12-15 DIAGNOSIS — R739 Hyperglycemia, unspecified: Secondary | ICD-10-CM | POA: Diagnosis present

## 2016-12-15 DIAGNOSIS — Z955 Presence of coronary angioplasty implant and graft: Secondary | ICD-10-CM

## 2016-12-15 DIAGNOSIS — Z961 Presence of intraocular lens: Secondary | ICD-10-CM | POA: Diagnosis present

## 2016-12-15 DIAGNOSIS — M48061 Spinal stenosis, lumbar region without neurogenic claudication: Secondary | ICD-10-CM | POA: Diagnosis present

## 2016-12-15 DIAGNOSIS — R0600 Dyspnea, unspecified: Secondary | ICD-10-CM | POA: Diagnosis present

## 2016-12-15 DIAGNOSIS — D72829 Elevated white blood cell count, unspecified: Secondary | ICD-10-CM | POA: Diagnosis present

## 2016-12-15 DIAGNOSIS — Z7902 Long term (current) use of antithrombotics/antiplatelets: Secondary | ICD-10-CM

## 2016-12-15 DIAGNOSIS — Z981 Arthrodesis status: Secondary | ICD-10-CM | POA: Diagnosis not present

## 2016-12-15 DIAGNOSIS — Z419 Encounter for procedure for purposes other than remedying health state, unspecified: Secondary | ICD-10-CM | POA: Diagnosis not present

## 2016-12-15 DIAGNOSIS — F419 Anxiety disorder, unspecified: Secondary | ICD-10-CM | POA: Diagnosis present

## 2016-12-15 DIAGNOSIS — I10 Essential (primary) hypertension: Secondary | ICD-10-CM | POA: Diagnosis present

## 2016-12-15 DIAGNOSIS — G8929 Other chronic pain: Secondary | ICD-10-CM | POA: Diagnosis present

## 2016-12-15 DIAGNOSIS — M5416 Radiculopathy, lumbar region: Secondary | ICD-10-CM | POA: Diagnosis present

## 2016-12-15 DIAGNOSIS — Z9889 Other specified postprocedural states: Secondary | ICD-10-CM | POA: Diagnosis not present

## 2016-12-15 DIAGNOSIS — Z881 Allergy status to other antibiotic agents status: Secondary | ICD-10-CM

## 2016-12-15 DIAGNOSIS — G8918 Other acute postprocedural pain: Secondary | ICD-10-CM

## 2016-12-15 DIAGNOSIS — K219 Gastro-esophageal reflux disease without esophagitis: Secondary | ICD-10-CM | POA: Diagnosis present

## 2016-12-15 DIAGNOSIS — Z8249 Family history of ischemic heart disease and other diseases of the circulatory system: Secondary | ICD-10-CM | POA: Diagnosis not present

## 2016-12-15 DIAGNOSIS — R1310 Dysphagia, unspecified: Secondary | ICD-10-CM | POA: Diagnosis present

## 2016-12-15 DIAGNOSIS — G8220 Paraplegia, unspecified: Secondary | ICD-10-CM | POA: Diagnosis present

## 2016-12-15 DIAGNOSIS — Z880 Allergy status to penicillin: Secondary | ICD-10-CM

## 2016-12-15 DIAGNOSIS — I2510 Atherosclerotic heart disease of native coronary artery without angina pectoris: Secondary | ICD-10-CM | POA: Diagnosis present

## 2016-12-15 DIAGNOSIS — I25119 Atherosclerotic heart disease of native coronary artery with unspecified angina pectoris: Secondary | ICD-10-CM | POA: Diagnosis not present

## 2016-12-15 DIAGNOSIS — J45909 Unspecified asthma, uncomplicated: Secondary | ICD-10-CM | POA: Diagnosis present

## 2016-12-15 DIAGNOSIS — M4804 Spinal stenosis, thoracic region: Secondary | ICD-10-CM | POA: Diagnosis not present

## 2016-12-15 DIAGNOSIS — K592 Neurogenic bowel, not elsewhere classified: Secondary | ICD-10-CM | POA: Diagnosis present

## 2016-12-15 DIAGNOSIS — E785 Hyperlipidemia, unspecified: Secondary | ICD-10-CM | POA: Diagnosis present

## 2016-12-15 DIAGNOSIS — G9529 Other cord compression: Secondary | ICD-10-CM | POA: Diagnosis present

## 2016-12-15 DIAGNOSIS — E7800 Pure hypercholesterolemia, unspecified: Secondary | ICD-10-CM | POA: Diagnosis present

## 2016-12-15 DIAGNOSIS — R0989 Other specified symptoms and signs involving the circulatory and respiratory systems: Secondary | ICD-10-CM | POA: Diagnosis not present

## 2016-12-15 DIAGNOSIS — R609 Edema, unspecified: Secondary | ICD-10-CM | POA: Diagnosis not present

## 2016-12-15 DIAGNOSIS — G9611 Dural tear: Secondary | ICD-10-CM | POA: Diagnosis not present

## 2016-12-15 DIAGNOSIS — R339 Retention of urine, unspecified: Secondary | ICD-10-CM | POA: Diagnosis present

## 2016-12-15 DIAGNOSIS — M792 Neuralgia and neuritis, unspecified: Secondary | ICD-10-CM

## 2016-12-15 DIAGNOSIS — Z85828 Personal history of other malignant neoplasm of skin: Secondary | ICD-10-CM | POA: Diagnosis not present

## 2016-12-15 DIAGNOSIS — Z885 Allergy status to narcotic agent status: Secondary | ICD-10-CM | POA: Diagnosis not present

## 2016-12-15 DIAGNOSIS — M21372 Foot drop, left foot: Secondary | ICD-10-CM | POA: Diagnosis present

## 2016-12-15 DIAGNOSIS — E890 Postprocedural hypothyroidism: Secondary | ICD-10-CM | POA: Diagnosis present

## 2016-12-15 DIAGNOSIS — T380X5A Adverse effect of glucocorticoids and synthetic analogues, initial encounter: Secondary | ICD-10-CM

## 2016-12-15 DIAGNOSIS — M7138 Other bursal cyst, other site: Secondary | ICD-10-CM | POA: Diagnosis present

## 2016-12-15 DIAGNOSIS — I8392 Asymptomatic varicose veins of left lower extremity: Secondary | ICD-10-CM | POA: Diagnosis present

## 2016-12-15 DIAGNOSIS — M549 Dorsalgia, unspecified: Secondary | ICD-10-CM | POA: Diagnosis present

## 2016-12-15 DIAGNOSIS — R410 Disorientation, unspecified: Secondary | ICD-10-CM | POA: Diagnosis not present

## 2016-12-15 DIAGNOSIS — M532X5 Spinal instabilities, thoracolumbar region: Secondary | ICD-10-CM | POA: Diagnosis present

## 2016-12-15 DIAGNOSIS — J385 Laryngeal spasm: Secondary | ICD-10-CM

## 2016-12-15 DIAGNOSIS — R0602 Shortness of breath: Secondary | ICD-10-CM | POA: Diagnosis not present

## 2016-12-15 DIAGNOSIS — G959 Disease of spinal cord, unspecified: Secondary | ICD-10-CM | POA: Diagnosis present

## 2016-12-15 DIAGNOSIS — N319 Neuromuscular dysfunction of bladder, unspecified: Secondary | ICD-10-CM | POA: Diagnosis not present

## 2016-12-15 DIAGNOSIS — E7849 Other hyperlipidemia: Secondary | ICD-10-CM | POA: Diagnosis present

## 2016-12-15 DIAGNOSIS — R2689 Other abnormalities of gait and mobility: Secondary | ICD-10-CM | POA: Diagnosis present

## 2016-12-15 DIAGNOSIS — M5440 Lumbago with sciatica, unspecified side: Secondary | ICD-10-CM | POA: Diagnosis not present

## 2016-12-15 DIAGNOSIS — R531 Weakness: Secondary | ICD-10-CM

## 2016-12-15 HISTORY — DX: Low back pain: M54.5

## 2016-12-15 HISTORY — DX: Low back pain, unspecified: M54.50

## 2016-12-15 HISTORY — DX: Other chronic pain: G89.29

## 2016-12-15 HISTORY — DX: Pure hypercholesterolemia, unspecified: E78.00

## 2016-12-15 HISTORY — PX: POSTERIOR LUMBAR FUSION 4 LEVEL: SHX6037

## 2016-12-15 HISTORY — PX: DECOMPRESSIVE LUMBAR LAMINECTOMY LEVEL 1: SHX5791

## 2016-12-15 HISTORY — DX: Atherosclerotic heart disease of native coronary artery without angina pectoris: I25.10

## 2016-12-15 HISTORY — DX: Basal cell carcinoma of skin of nose: C44.311

## 2016-12-15 LAB — BASIC METABOLIC PANEL
ANION GAP: 11 (ref 5–15)
BUN: 13 mg/dL (ref 6–20)
CO2: 26 mmol/L (ref 22–32)
Calcium: 9.5 mg/dL (ref 8.9–10.3)
Chloride: 104 mmol/L (ref 101–111)
Creatinine, Ser: 0.67 mg/dL (ref 0.44–1.00)
GFR calc Af Amer: >60 mL/min (ref >60)
GFR calc non Af Amer: >60 mL/min (ref >60)
GLUCOSE: 93 mg/dL (ref 65–99)
POTASSIUM: 4 mmol/L (ref 3.5–5.1)
Sodium: 141 mmol/L (ref 135–145)

## 2016-12-15 LAB — CBC
HCT: 43.6 % (ref 36.0–46.0)
HEMOGLOBIN: 14.1 g/dL (ref 12.0–15.0)
MCH: 29.2 pg (ref 26.0–34.0)
MCHC: 32.3 g/dL (ref 30.0–36.0)
MCV: 90.3 fL (ref 78.0–100.0)
Platelets: 225 10*3/uL (ref 150–400)
RBC: 4.83 MIL/uL (ref 3.87–5.11)
RDW: 14.8 % (ref 11.5–15.5)
WBC: 11.5 10*3/uL — ABNORMAL HIGH (ref 4.0–10.5)

## 2016-12-15 LAB — TYPE AND SCREEN
ABO/RH(D): A POS
Antibody Screen: NEGATIVE

## 2016-12-15 LAB — PROTIME-INR
INR: 0.9
PROTHROMBIN TIME: 12.1 s (ref 11.4–15.2)

## 2016-12-15 SURGERY — DECOMPRESSIVE LUMBAR LAMINECTOMY LEVEL 1
Anesthesia: General | Site: Back

## 2016-12-15 MED ORDER — LIDOCAINE-EPINEPHRINE 1 %-1:100000 IJ SOLN
INTRAMUSCULAR | Status: DC | PRN
  Administered 2016-12-15: 10 mL via INTRADERMAL

## 2016-12-15 MED ORDER — SODIUM CHLORIDE 0.9 % IR SOLN
Status: DC | PRN
  Administered 2016-12-15 – 2016-12-16 (×2): 500 mL

## 2016-12-15 MED ORDER — EPHEDRINE SULFATE 50 MG/ML IJ SOLN
INTRAMUSCULAR | Status: DC | PRN
Start: 2016-12-15 — End: 2016-12-16
  Administered 2016-12-15: 5 mg via INTRAVENOUS

## 2016-12-15 MED ORDER — LACTATED RINGERS IV SOLN
INTRAVENOUS | Status: DC | PRN
  Administered 2016-12-15 – 2016-12-16 (×2): via INTRAVENOUS

## 2016-12-15 MED ORDER — THROMBIN 20000 UNITS EX SOLR
CUTANEOUS | Status: AC
  Filled 2016-12-15: qty 20000

## 2016-12-15 MED ORDER — SODIUM CHLORIDE 0.9 % IV SOLN
Freq: Once | INTRAVENOUS | Status: AC
  Administered 2016-12-16: 06:00:00 via INTRAVENOUS

## 2016-12-15 MED ORDER — FENTANYL CITRATE (PF) 250 MCG/5ML IJ SOLN
INTRAMUSCULAR | Status: AC
  Filled 2016-12-15: qty 5

## 2016-12-15 MED ORDER — LIDOCAINE-EPINEPHRINE 1 %-1:100000 IJ SOLN
INTRAMUSCULAR | Status: AC
  Filled 2016-12-15: qty 1

## 2016-12-15 MED ORDER — SUCCINYLCHOLINE CHLORIDE 20 MG/ML IJ SOLN
INTRAMUSCULAR | Status: DC | PRN
  Administered 2016-12-15: 100 mg via INTRAVENOUS

## 2016-12-15 MED ORDER — THROMBIN 20000 UNITS EX SOLR
CUTANEOUS | Status: DC | PRN
  Administered 2016-12-15: 20 mL

## 2016-12-15 MED ORDER — PROPOFOL 10 MG/ML IV BOLUS
INTRAVENOUS | Status: AC
  Filled 2016-12-15: qty 20

## 2016-12-15 MED ORDER — THROMBIN 5000 UNITS EX SOLR
OROMUCOSAL | Status: DC | PRN
  Administered 2016-12-15: 5 mL

## 2016-12-15 MED ORDER — VANCOMYCIN HCL 1000 MG IV SOLR
INTRAVENOUS | Status: DC | PRN
  Administered 2016-12-15: 1000 mg via INTRAVENOUS

## 2016-12-15 MED ORDER — PHENYLEPHRINE HCL 10 MG/ML IJ SOLN
INTRAMUSCULAR | Status: DC | PRN
  Administered 2016-12-15 (×2): 40 ug via INTRAVENOUS

## 2016-12-15 MED ORDER — MIDAZOLAM HCL 2 MG/2ML IJ SOLN
INTRAMUSCULAR | Status: AC
  Filled 2016-12-15: qty 2

## 2016-12-15 MED ORDER — DEXAMETHASONE SODIUM PHOSPHATE 10 MG/ML IJ SOLN
10.0000 mg | Freq: Once | INTRAMUSCULAR | Status: AC
  Administered 2016-12-15: 10 mg via INTRAVENOUS
  Filled 2016-12-15: qty 1

## 2016-12-15 MED ORDER — ROCURONIUM BROMIDE 100 MG/10ML IV SOLN
INTRAVENOUS | Status: DC | PRN
  Administered 2016-12-15: 10 mg via INTRAVENOUS
  Administered 2016-12-15: 50 mg via INTRAVENOUS
  Administered 2016-12-16: 10 mg via INTRAVENOUS

## 2016-12-15 MED ORDER — FENTANYL CITRATE (PF) 250 MCG/5ML IJ SOLN
INTRAMUSCULAR | Status: DC | PRN
Start: 2016-12-15 — End: 2016-12-16
  Administered 2016-12-15 – 2016-12-16 (×8): 50 ug via INTRAVENOUS

## 2016-12-15 MED ORDER — BUPIVACAINE LIPOSOME 1.3 % IJ SUSP
20.0000 mL | Freq: Once | INTRAMUSCULAR | Status: DC
  Filled 2016-12-15 (×2): qty 20

## 2016-12-15 MED ORDER — VANCOMYCIN HCL IN DEXTROSE 1-5 GM/200ML-% IV SOLN
INTRAVENOUS | Status: AC
  Filled 2016-12-15: qty 200

## 2016-12-15 MED ORDER — 0.9 % SODIUM CHLORIDE (POUR BTL) OPTIME
TOPICAL | Status: DC | PRN
  Administered 2016-12-15 – 2016-12-16 (×2): 1000 mL

## 2016-12-15 MED ORDER — LORAZEPAM 2 MG/ML IJ SOLN
1.0000 mg | Freq: Once | INTRAMUSCULAR | Status: AC
  Administered 2016-12-15: 1 mg via INTRAVENOUS
  Filled 2016-12-15: qty 1

## 2016-12-15 MED ORDER — THROMBIN 5000 UNITS EX SOLR
CUTANEOUS | Status: AC
  Filled 2016-12-15: qty 5000

## 2016-12-15 MED ORDER — LIDOCAINE HCL (CARDIAC) 20 MG/ML IV SOLN
INTRAVENOUS | Status: DC | PRN
  Administered 2016-12-15: 60 mg via INTRATRACHEAL

## 2016-12-15 MED ORDER — VANCOMYCIN HCL 1000 MG IV SOLR
INTRAVENOUS | Status: AC
  Filled 2016-12-15: qty 1000

## 2016-12-15 MED ORDER — PROPOFOL 10 MG/ML IV BOLUS
INTRAVENOUS | Status: DC | PRN
  Administered 2016-12-15: 120 mg via INTRAVENOUS

## 2016-12-15 SURGICAL SUPPLY — 76 items
BAG DECANTER FOR FLEXI CONT (MISCELLANEOUS) ×6 IMPLANT
BENZOIN TINCTURE PRP APPL 2/3 (GAUZE/BANDAGES/DRESSINGS) ×3 IMPLANT
BLADE CLIPPER SURG (BLADE) IMPLANT
BLADE SURG 11 STRL SS (BLADE) IMPLANT
BONE MATRIX OSTEOCEL PRO MED (Bone Implant) ×12 IMPLANT
BUR CUTTER 7.0 ROUND (BURR) ×3 IMPLANT
BUR MATCHSTICK NEURO 3.0 LAGG (BURR) ×6 IMPLANT
CANISTER SUCT 3000ML PPV (MISCELLANEOUS) ×6 IMPLANT
CARTRIDGE OIL MAESTRO DRILL (MISCELLANEOUS) ×2 IMPLANT
CLOSURE STERI-STRIP 1/2X4 (GAUZE/BANDAGES/DRESSINGS) ×3
CLOSURE WOUND 1/2 X4 (GAUZE/BANDAGES/DRESSINGS) ×4
CLSR STERI-STRIP ANTIMIC 1/2X4 (GAUZE/BANDAGES/DRESSINGS) ×6 IMPLANT
CONT SPEC 4OZ CLIKSEAL STRL BL (MISCELLANEOUS) ×6 IMPLANT
COVER BACK TABLE 60X90IN (DRAPES) ×6 IMPLANT
COVER TABLE BACK 60X90 (DRAPES) ×3 IMPLANT
DECANTER SPIKE VIAL GLASS SM (MISCELLANEOUS) ×6 IMPLANT
DERMABOND ADVANCED (GAUZE/BANDAGES/DRESSINGS) ×4
DERMABOND ADVANCED .7 DNX12 (GAUZE/BANDAGES/DRESSINGS) ×2 IMPLANT
DIFFUSER DRILL AIR PNEUMATIC (MISCELLANEOUS) ×6 IMPLANT
DRAPE C-ARM 42X72 X-RAY (DRAPES) ×6 IMPLANT
DRAPE C-ARMOR (DRAPES) ×6 IMPLANT
DRAPE HALF SHEET 40X57 (DRAPES) IMPLANT
DRAPE LAPAROTOMY 100X72X124 (DRAPES) ×6 IMPLANT
DRAPE POUCH INSTRU U-SHP 10X18 (DRAPES) ×6 IMPLANT
DRAPE SURG 17X23 STRL (DRAPES) ×6 IMPLANT
DRSG OPSITE 4X5.5 SM (GAUZE/BANDAGES/DRESSINGS) ×3 IMPLANT
DRSG OPSITE POSTOP 4X10 (GAUZE/BANDAGES/DRESSINGS) ×3 IMPLANT
DURAPREP 26ML APPLICATOR (WOUND CARE) ×6 IMPLANT
ELECT REM PT RETURN 9FT ADLT (ELECTROSURGICAL) ×6
ELECTRODE REM PT RTRN 9FT ADLT (ELECTROSURGICAL) ×2 IMPLANT
EVACUATOR 3/16  PVC DRAIN (DRAIN) ×4
EVACUATOR 3/16 PVC DRAIN (DRAIN) ×2 IMPLANT
GAUZE SPONGE 4X4 12PLY STRL (GAUZE/BANDAGES/DRESSINGS) ×3 IMPLANT
GAUZE SPONGE 4X4 16PLY XRAY LF (GAUZE/BANDAGES/DRESSINGS) ×6 IMPLANT
GLOVE BIO SURGEON STRL SZ7 (GLOVE) ×15 IMPLANT
GLOVE BIO SURGEON STRL SZ8 (GLOVE) ×12 IMPLANT
GLOVE BIOGEL PI IND STRL 7.0 (GLOVE) ×1 IMPLANT
GLOVE BIOGEL PI IND STRL 7.5 (GLOVE) ×1 IMPLANT
GLOVE BIOGEL PI INDICATOR 7.0 (GLOVE) ×2
GLOVE BIOGEL PI INDICATOR 7.5 (GLOVE) ×2
GLOVE EXAM NITRILE LRG STRL (GLOVE) IMPLANT
GLOVE EXAM NITRILE XL STR (GLOVE) IMPLANT
GLOVE EXAM NITRILE XS STR PU (GLOVE) IMPLANT
GLOVE INDICATOR 8.5 STRL (GLOVE) ×12 IMPLANT
GOWN STRL REUS W/ TWL LRG LVL3 (GOWN DISPOSABLE) IMPLANT
GOWN STRL REUS W/ TWL XL LVL3 (GOWN DISPOSABLE) ×4 IMPLANT
GOWN STRL REUS W/TWL 2XL LVL3 (GOWN DISPOSABLE) IMPLANT
GOWN STRL REUS W/TWL LRG LVL3 (GOWN DISPOSABLE)
GOWN STRL REUS W/TWL XL LVL3 (GOWN DISPOSABLE) ×8
KIT BASIN OR (CUSTOM PROCEDURE TRAY) ×9 IMPLANT
KIT INFUSE MEDIUM (Orthopedic Implant) ×3 IMPLANT
KIT ROOM TURNOVER OR (KITS) ×6 IMPLANT
MILL MEDIUM DISP (BLADE) ×3 IMPLANT
NEEDLE HYPO 21X1.5 SAFETY (NEEDLE) ×12 IMPLANT
NEEDLE HYPO 25X1 1.5 SAFETY (NEEDLE) ×6 IMPLANT
NS IRRIG 1000ML POUR BTL (IV SOLUTION) ×6 IMPLANT
OIL CARTRIDGE MAESTRO DRILL (MISCELLANEOUS) ×6
PACK LAMINECTOMY NEURO (CUSTOM PROCEDURE TRAY) ×6 IMPLANT
PAD ARMBOARD 7.5X6 YLW CONV (MISCELLANEOUS) ×21 IMPLANT
ROD RELINE TI LATERAL MED OFF (Rod) ×6 IMPLANT
SCREW LOCK RELINE 5.5 TULIP (Screw) ×24 IMPLANT
SCREW RELINE-O POLY 5.5X40 (Screw) ×18 IMPLANT
SEALANT ADHERUS EXTEND TIP (MISCELLANEOUS) ×3 IMPLANT
SPONGE LAP 4X18 X RAY DECT (DISPOSABLE) IMPLANT
SPONGE SURGIFOAM ABS GEL 100 (HEMOSTASIS) ×6 IMPLANT
STRIP CLOSURE SKIN 1/2X4 (GAUZE/BANDAGES/DRESSINGS) ×8 IMPLANT
SUT VIC AB 0 CT1 18XCR BRD8 (SUTURE) ×6 IMPLANT
SUT VIC AB 0 CT1 8-18 (SUTURE) ×12
SUT VIC AB 2-0 CT1 18 (SUTURE) ×15 IMPLANT
SUT VIC AB 4-0 PS2 27 (SUTURE) ×9 IMPLANT
SYR 20CC LL (SYRINGE) ×6 IMPLANT
SYR 30ML LL (SYRINGE) ×6 IMPLANT
TOWEL GREEN STERILE (TOWEL DISPOSABLE) ×6 IMPLANT
TOWEL GREEN STERILE FF (TOWEL DISPOSABLE) ×6 IMPLANT
TRAY FOLEY W/METER SILVER 16FR (SET/KITS/TRAYS/PACK) ×6 IMPLANT
WATER STERILE IRR 1000ML POUR (IV SOLUTION) ×6 IMPLANT

## 2016-12-15 NOTE — ED Triage Notes (Signed)
Pt was tx here for L leg pain and weakness on 7/7.  Pt was given a spinal injection by Dr Ellene Route on Monday.  Today both her legs gave out on her.  R leg is week, L leg weaker.

## 2016-12-15 NOTE — H&P (Signed)
Kim Mcgee is an 76 y.o. female.   Chief Complaint: Back left greater than right leg pain HPI: 76 year old female with long-standing history of back trouble status post L1-S1 fusion who over the last 3-4 weeks and said worsening back pain and left leg pain with weakness of her left foot drag it behind her. She contacted Dr. Clarice Pole office she was set up for an epidural steroid injection to which she got on Monday and today had an episode where her legs gave way on her and she fell. Since the fall she's been complaining of slightly increased numbness in the left lower extremities no significant change in the weakness of movement of her foot that's been there for 3-4 weeks she has pain around the right ankle that keeps her from moving her right foot she has a strong EHL on that side. Denies any difficulty with bowel or bladder.  Past Medical History:  Diagnosis Date  . Anxiety   . Arthritis    lumbar DDD  . Asthma   . Cancer ([place])    basal cell CA  . Dysrhythmia   . GERD (gastroesophageal reflux disease)   . Hypertension   . Hypothyroidism   . Normal cardiac stress test   . PONV (postoperative nausea and vomiting)   . Shortness of breath    /w walking long distance   . Swallowing difficulty    being seen by ENT- had endoscopy- told that its probably related to past neck surgeries     Past Surgical History:  Procedure Laterality Date  . APPENDECTOMY    . CARDIAC CATHETERIZATION     "long time ago"   . CERVICAL FUSION    . EYE SURGERY     L eye- cataract removed  - /w IOL   . TONSILLECTOMY    . TOTAL THYROIDECTOMY     1990's  . TUBAL LIGATION      No family history on file. Social History:  reports that she has never smoked. She has never used smokeless tobacco. She reports that she does not drink alcohol or use drugs.  Allergies:  Allergies  Allergen Reactions  . Macrodantin [Nitrofurantoin] Other (See Comments)    Chest pain  . Penicillins Rash    Chest pain,  also Has patient had a PCN reaction causing immediate rash, facial/tongue/throat swelling, SOB or lightheadedness with hypotension: Yes Has patient had a PCN reaction causing severe rash involving mucus membranes or skin necrosis: No Has patient had a PCN reaction that required hospitalization: No Has patient had a PCN reaction occurring within the last 10 years: Yes If all of the above answers are "NO", then may proceed with Cephalosporin use.   . Tape Rash    No adhesive tape, but COBAN WRAP IS TOLERATED     (Not in a hospital admission)  Results for orders placed or performed during the hospital encounter of 12/15/16 (from the past 48 hour(s))  Basic metabolic panel     Status: None   Collection Time: 12/15/16  2:36 PM  Result Value Ref Range   Sodium 141 135 - 145 mmol/L   Potassium 4.0 3.5 - 5.1 mmol/L   Chloride 104 101 - 111 mmol/L   CO2 26 22 - 32 mmol/L   Glucose, Bld 93 65 - 99 mg/dL   BUN 13 6 - 20 mg/dL   Creatinine, Ser 0.67 0.44 - 1.00 mg/dL   Calcium 9.5 8.9 - 10.3 mg/dL   GFR calc non Af Amer >60 >  60 mL/min   GFR calc Af Amer >60 >60 mL/min    Comment: (NOTE) The eGFR has been calculated using the CKD EPI equation. This calculation has not been validated in all clinical situations. eGFR's persistently <60 mL/min signify possible Chronic Kidney Disease.    Anion gap 11 5 - 15  CBC     Status: Abnormal   Collection Time: 12/15/16  2:36 PM  Result Value Ref Range   WBC 11.5 (H) 4.0 - 10.5 K/uL   RBC 4.83 3.87 - 5.11 MIL/uL   Hemoglobin 14.1 12.0 - 15.0 g/dL   HCT 43.6 36.0 - 46.0 %   MCV 90.3 78.0 - 100.0 fL   MCH 29.2 26.0 - 34.0 pg   MCHC 32.3 30.0 - 36.0 g/dL   RDW 14.8 11.5 - 15.5 %   Platelets 225 150 - 400 K/uL   No results found.  Review of Systems  Musculoskeletal: Positive for back pain, joint pain and myalgias.  Neurological: Positive for tingling and sensory change.    Blood pressure (!) 150/67, pulse 60, temperature 97.8 F (36.6 C),  temperature source Oral, resp. rate 16, height '5\' 2"'$  (1.575 m), weight 74.4 kg (164 lb), SpO2 99 %. Physical Exam  Constitutional: She is oriented to person, place, and time. She appears well-developed and well-nourished.  Eyes: Pupils are equal, round, and reactive to light.  Neck: Normal range of motion.  GI: Soft.  Neurological: She is alert and oriented to person, place, and time. She has normal strength. GCS eye subscore is 4. GCS verbal subscore is 5. GCS motor subscore is 6.  Strength is 5 out of 5 iliopsoas, quads, hamstrings, gastrocs left-sided foot drop 1-2 out of 5 right-sided dorsiflexion weakness but seems to be mostly related to pain in her ankle following the fall. She reports some subjective decreased sensation left lower extremity that may be new in the last 24-48 hours.     Assessment/Plan 25 her old female with acute on chronic back pain a left-sided foot drop is been there now for 3-4 weeks status post epidural steroid injection Monday with increased pain and had an episode today were her legs gave way and she fell. I recommended and we have ordered follow-up MRI scan without contrast as well as IV steroids pending the results of the MRI scan and we will more than likely admit her placed on IV steroids overnight and discuss definitive treatment plan based on the results of the MRI.  Marvette Schamp P, MD 12/15/2016, 5:54 PM

## 2016-12-15 NOTE — Anesthesia Procedure Notes (Signed)
Procedure Name: Intubation Date/Time: 12/15/2016 10:36 PM Performed by: Maude Leriche D Pre-anesthesia Checklist: Patient identified, Emergency Drugs available, Suction available, Patient being monitored and Timeout performed Patient Re-evaluated:Patient Re-evaluated prior to induction Oxygen Delivery Method: Circle system utilized Preoxygenation: Pre-oxygenation with 100% oxygen Induction Type: IV induction, Rapid sequence and Cricoid Pressure applied Laryngoscope Size: Miller and 2 Grade View: Grade I Tube type: Subglottic suction tube Tube size: 7.5 mm Number of attempts: 1 Airway Equipment and Method: Stylet Placement Confirmation: ETT inserted through vocal cords under direct vision Secured at: 21 cm Tube secured with: Tape Dental Injury: Teeth and Oropharynx as per pre-operative assessment

## 2016-12-15 NOTE — ED Notes (Signed)
Patient transported to X-ray 

## 2016-12-15 NOTE — ED Provider Notes (Signed)
The patient was placed in the room and 5 mins after this Dr. Saintclair Halsted was here to evaluate. I briefly spoke with the patient and she is here for bilateral leg weakness. She also fell due to this. Dr. Saintclair Halsted ordered all of her testing. He has followed up with her tests and will be taking her to surgery for a hematoma that has formed following her spinal injection done this past Monday. Patient has been stable here in the ER.    Dalia Heading, PA-C 12/15/16 2212    Davonna Belling, MD 12/16/16 [phone]

## 2016-12-15 NOTE — Progress Notes (Signed)
Patient ID: Kim Mcgee, female   DOB: 09/29/1940, 76 y.o.   MRN: [id number] Repeat MRI scan shows focal 78 mm collection presumptive hematoma dorsally causing severe spinal cord compression at T12-L1. Clearly this is much worse than it was on the original MRI scan 3-4 weeks ago. The patient clinically has a progressive per Laverle Patter is very difficult to know how much change in her motor exam is happened over the last 2 days the patient and her husband feel like she's gone from being ambulatory nonambulatory over the last 24 hours and she clearly has a virtually complete foot drop on the left partial footdrop on the right but it is difficult to examine that foot because of ankle pain that she has subsequent to the fall. She had stopped her Plavix for week leading into the epidural injection however she did take her Plavix yesterday and today however due to her progressive per [place] as I do not think we have the luxury of time to wait the 3-5 days for the Plavix to be out of her system. I've extensively gone over the risks and benefits especially of bleeding with the patient and her family and they agree that due to the progressive loss of strength in last 24 hours they do not want to wait and they understand the increased bleeding risk of surgery. I explained to them the options of doing the operation staged fashion doing the decompression alone and deferring the fusion if the Plavix effect does cause a lot of bleeding during the exposure. I will plan on transfusing her platelets at the start of the operation proceeding forward with a decompression if bleeding is under control we'll proceed forward and do the entire operation which would include a T10-L2 pedicle screw fixation. I explained them that if it was difficult to control the bleeding that I would do the decompression only we would leave the Plavix out of her system and bring her back in 5 days to do the fusion. They understand all these risks benefits and  alternatives and they agree to proceed forward.

## 2016-12-15 NOTE — Anesthesia Preprocedure Evaluation (Incomplete)

## 2016-12-15 NOTE — Anesthesia Preprocedure Evaluation (Addendum)
Anesthesia Evaluation  Patient identified by MRN, date of birth, ID band Patient awake    Reviewed: Allergy & Precautions, NPO status , Patient's Chart, lab work & pertinent test results  History of Anesthesia Complications (+) PONV  Airway Mallampati: II  TM Distance: >3 FB Neck ROM: Full    Dental  (+) Teeth Intact, Dental Advisory Given   Pulmonary shortness of breath, asthma ,    breath sounds clear to auscultation       Cardiovascular hypertension, + Cardiac Stents  + dysrhythmias  Rhythm:Regular Rate:Normal     Neuro/Psych    GI/Hepatic Neg liver ROS, GERD  ,  Endo/Other  Hypothyroidism   Renal/GU negative Renal ROS     Musculoskeletal  (+) Arthritis ,   Abdominal   Peds  Hematology   Anesthesia Other Findings   Reproductive/Obstetrics                           Anesthesia Physical Anesthesia Plan  ASA: IV and emergent  Anesthesia Plan: General   Post-op Pain Management:    Induction: Intravenous  PONV Risk Score and Plan: 4 or greater and Ondansetron, Dexamethasone, Propofol, Midazolam and Scopolamine patch - Pre-op  Airway Management Planned: Oral ETT  Additional Equipment:   Intra-op Plan:   Post-operative Plan: Possible Post-op intubation/ventilation  Informed Consent: I have reviewed the patients History and Physical, chart, labs and discussed the procedure including the risks, benefits and alternatives for the proposed anesthesia with the patient or authorized representative who has indicated his/her understanding and acceptance.   Dental advisory given  Plan Discussed with: Anesthesiologist, CRNA and Surgeon  Anesthesia Plan Comments:        Anesthesia Quick Evaluation

## 2016-12-16 ENCOUNTER — Inpatient Hospital Stay (HOSPITAL_COMMUNITY): Payer: Medicare Other

## 2016-12-16 ENCOUNTER — Encounter (HOSPITAL_COMMUNITY): Payer: Self-pay | Admitting: Neurosurgery

## 2016-12-16 DIAGNOSIS — G8220 Paraplegia, unspecified: Secondary | ICD-10-CM | POA: Diagnosis present

## 2016-12-16 LAB — BPAM PLATELET PHERESIS
Blood Product Expiration Date: 201807182359
ISSUE DATE / TIME: 201807182212
Unit Type and Rh: 6200

## 2016-12-16 LAB — PREPARE PLATELET PHERESIS: Unit division: 0

## 2016-12-16 LAB — BASIC METABOLIC PANEL
Anion gap: 8 (ref 5–15)
BUN: 10 mg/dL (ref 6–20)
CHLORIDE: 103 mmol/L (ref 101–111)
CO2: 29 mmol/L (ref 22–32)
Calcium: 9.3 mg/dL (ref 8.9–10.3)
Creatinine, Ser: 0.69 mg/dL (ref 0.44–1.00)
GFR calc Af Amer: >60 mL/min (ref >60)
GFR calc non Af Amer: >60 mL/min (ref >60)
Glucose, Bld: 180 mg/dL — ABNORMAL HIGH (ref 65–99)
Potassium: 4.5 mmol/L (ref 3.5–5.1)
Sodium: 140 mmol/L (ref 135–145)

## 2016-12-16 MED ORDER — ONDANSETRON HCL 4 MG/2ML IJ SOLN
INTRAMUSCULAR | Status: DC | PRN
  Administered 2016-12-16: 4 mg via INTRAVENOUS

## 2016-12-16 MED ORDER — BUPIVACAINE LIPOSOME 1.3 % IJ SUSP
INTRAMUSCULAR | Status: DC | PRN
  Administered 2016-12-16: 20 mL

## 2016-12-16 MED ORDER — SUGAMMADEX SODIUM 200 MG/2ML IV SOLN
INTRAVENOUS | Status: AC
  Filled 2016-12-16: qty 2

## 2016-12-16 MED ORDER — METHOCARBAMOL 500 MG PO TABS: 500.0000 mg | ORAL_TABLET | Freq: Three times a day (TID) | ORAL | Status: DC | PRN

## 2016-12-16 MED ORDER — ALBUTEROL SULFATE (2.5 MG/3ML) 0.083% IN NEBU
3.0000 mL | INHALATION_SOLUTION | Freq: Four times a day (QID) | RESPIRATORY_TRACT | Status: DC | PRN
  Administered 2016-12-17: 3 mL via RESPIRATORY_TRACT
  Filled 2016-12-16: qty 3

## 2016-12-16 MED ORDER — VANCOMYCIN HCL IN DEXTROSE 750-5 MG/150ML-% IV SOLN
750.0000 mg | Freq: Two times a day (BID) | INTRAVENOUS | Status: DC
  Administered 2016-12-16 – 2016-12-21 (×11): 750 mg via INTRAVENOUS
  Filled 2016-12-16 (×12): qty 150

## 2016-12-16 MED ORDER — CYCLOBENZAPRINE HCL 10 MG PO TABS
10.0000 mg | ORAL_TABLET | Freq: Three times a day (TID) | ORAL | Status: DC | PRN
  Administered 2016-12-16 – 2016-12-21 (×10): 10 mg via ORAL
  Filled 2016-12-16 (×11): qty 1

## 2016-12-16 MED ORDER — PANTOPRAZOLE SODIUM 40 MG PO TBEC: 40.0000 mg | DELAYED_RELEASE_TABLET | Freq: Every day | ORAL | Status: DC

## 2016-12-16 MED ORDER — DEXAMETHASONE SODIUM PHOSPHATE 10 MG/ML IJ SOLN
10.0000 mg | Freq: Four times a day (QID) | INTRAMUSCULAR | Status: DC
  Administered 2016-12-16 – 2016-12-21 (×20): 10 mg via INTRAVENOUS
  Filled 2016-12-16 (×19): qty 1

## 2016-12-16 MED ORDER — CYCLOBENZAPRINE HCL 10 MG PO TABS: 10.0000 mg | ORAL_TABLET | Freq: Three times a day (TID) | ORAL | Status: DC | PRN

## 2016-12-16 MED ORDER — GENTAMICIN SULFATE 40 MG/ML IJ SOLN
420.0000 mg | Freq: Once | INTRAMUSCULAR | Status: AC
  Administered 2016-12-16: 420 mg via INTRAVENOUS
  Filled 2016-12-16: qty 10.5

## 2016-12-16 MED ORDER — MENTHOL 3 MG MT LOZG: 1.0000 | LOZENGE | OROMUCOSAL | Status: DC | PRN

## 2016-12-16 MED ORDER — SODIUM CHLORIDE 0.9% FLUSH: 3.0000 mL | INTRAVENOUS | Status: DC | PRN

## 2016-12-16 MED ORDER — SENNOSIDES-DOCUSATE SODIUM 8.6-50 MG PO TABS
1.0000 | ORAL_TABLET | Freq: Every evening | ORAL | Status: DC | PRN
  Administered 2016-12-18: 1 via ORAL
  Filled 2016-12-16: qty 1

## 2016-12-16 MED ORDER — TRAMADOL HCL 50 MG PO TABS
50.0000 mg | ORAL_TABLET | Freq: Four times a day (QID) | ORAL | Status: DC | PRN
  Administered 2016-12-16 – 2016-12-20 (×6): 50 mg via ORAL
  Filled 2016-12-16 (×6): qty 1

## 2016-12-16 MED ORDER — CEPHALEXIN 500 MG PO CAPS: 500.0000 mg | ORAL_CAPSULE | ORAL | Status: DC

## 2016-12-16 MED ORDER — DOCUSATE SODIUM 100 MG PO CAPS
100.0000 mg | ORAL_CAPSULE | Freq: Two times a day (BID) | ORAL | Status: DC
  Administered 2016-12-16 – 2016-12-21 (×10): 100 mg via ORAL
  Filled 2016-12-16 (×11): qty 1

## 2016-12-16 MED ORDER — METOPROLOL SUCCINATE ER 50 MG PO TB24
50.0000 mg | ORAL_TABLET | Freq: Two times a day (BID) | ORAL | Status: DC
  Administered 2016-12-16 – 2016-12-21 (×11): 50 mg via ORAL
  Filled 2016-12-16 (×11): qty 1

## 2016-12-16 MED ORDER — MOMETASONE FURO-FORMOTEROL FUM 200-5 MCG/ACT IN AERO
2.0000 | INHALATION_SPRAY | Freq: Two times a day (BID) | RESPIRATORY_TRACT | Status: DC
  Administered 2016-12-16 – 2016-12-21 (×11): 2 via RESPIRATORY_TRACT
  Filled 2016-12-16: qty 8.8

## 2016-12-16 MED ORDER — LORAZEPAM 1 MG PO TABS
1.0000 mg | ORAL_TABLET | Freq: Every day | ORAL | Status: DC
  Administered 2016-12-16 – 2016-12-20 (×5): 1 mg via ORAL
  Filled 2016-12-16 (×5): qty 1

## 2016-12-16 MED ORDER — ONDANSETRON 4 MG PO TBDP: 4.0000 mg | ORAL_TABLET | Freq: Three times a day (TID) | ORAL | Status: DC | PRN

## 2016-12-16 MED ORDER — SUGAMMADEX SODIUM 200 MG/2ML IV SOLN
INTRAVENOUS | Status: DC | PRN
  Administered 2016-12-16: 200 mg via INTRAVENOUS

## 2016-12-16 MED ORDER — SODIUM CHLORIDE 0.9 % IV SOLN
250.0000 mL | INTRAVENOUS | Status: DC
  Administered 2016-12-16: 250 mL via INTRAVENOUS

## 2016-12-16 MED ORDER — SODIUM CHLORIDE 0.9% FLUSH
3.0000 mL | Freq: Two times a day (BID) | INTRAVENOUS | Status: DC
  Administered 2016-12-16 – 2016-12-21 (×9): 3 mL via INTRAVENOUS

## 2016-12-16 MED ORDER — ACETAMINOPHEN 325 MG PO TABS: 325.0000 mg | ORAL_TABLET | Freq: Four times a day (QID) | ORAL | Status: DC | PRN

## 2016-12-16 MED ORDER — ALUM & MAG HYDROXIDE-SIMETH 200-200-20 MG/5ML PO SUSP: 30.0000 mL | Freq: Four times a day (QID) | ORAL | Status: DC | PRN

## 2016-12-16 MED ORDER — POLYETHYLENE GLYCOL 3350 17 G PO PACK
17.0000 g | PACK | Freq: Every day | ORAL | Status: DC
  Administered 2016-12-16 – 2016-12-21 (×6): 17 g via ORAL
  Filled 2016-12-16 (×6): qty 1

## 2016-12-16 MED ORDER — ONDANSETRON HCL 4 MG/2ML IJ SOLN
4.0000 mg | Freq: Four times a day (QID) | INTRAMUSCULAR | Status: DC | PRN
  Administered 2016-12-16: 4 mg via INTRAVENOUS
  Filled 2016-12-16: qty 2

## 2016-12-16 MED ORDER — HYDROCODONE-ACETAMINOPHEN 5-325 MG PO TABS
1.0000 | ORAL_TABLET | ORAL | Status: DC | PRN
  Administered 2016-12-16 – 2016-12-21 (×21): 1 via ORAL
  Filled 2016-12-16 (×21): qty 1

## 2016-12-16 MED ORDER — VANCOMYCIN HCL 1000 MG IV SOLR
INTRAVENOUS | Status: DC | PRN
  Administered 2016-12-16: 1000 mg

## 2016-12-16 MED ORDER — ONDANSETRON HCL 4 MG PO TABS: 4.0000 mg | ORAL_TABLET | Freq: Four times a day (QID) | ORAL | Status: DC | PRN

## 2016-12-16 MED ORDER — LEVOTHYROXINE SODIUM 112 MCG PO TABS
112.0000 ug | ORAL_TABLET | Freq: Every day | ORAL | Status: DC
  Administered 2016-12-16 – 2016-12-21 (×6): 112 ug via ORAL
  Filled 2016-12-16 (×6): qty 1

## 2016-12-16 MED ORDER — NITROGLYCERIN 0.4 MG SL SUBL: 0.4000 mg | SUBLINGUAL_TABLET | SUBLINGUAL | Status: DC | PRN

## 2016-12-16 MED ORDER — LORATADINE 10 MG PO TABS
10.0000 mg | ORAL_TABLET | Freq: Every day | ORAL | Status: DC
  Administered 2016-12-16 – 2016-12-21 (×6): 10 mg via ORAL
  Filled 2016-12-16 (×6): qty 1

## 2016-12-16 MED ORDER — GABAPENTIN 300 MG PO CAPS
300.0000 mg | ORAL_CAPSULE | Freq: Three times a day (TID) | ORAL | Status: DC
  Administered 2016-12-16 – 2016-12-21 (×17): 300 mg via ORAL
  Filled 2016-12-16 (×17): qty 1

## 2016-12-16 MED ORDER — TAMSULOSIN HCL 0.4 MG PO CAPS
0.4000 mg | ORAL_CAPSULE | Freq: Every day | ORAL | Status: DC
  Administered 2016-12-16 – 2016-12-21 (×6): 0.4 mg via ORAL
  Filled 2016-12-16 (×6): qty 1

## 2016-12-16 MED ORDER — ACETAMINOPHEN 325 MG PO TABS: 650.0000 mg | ORAL_TABLET | ORAL | Status: DC | PRN

## 2016-12-16 MED ORDER — PHENOL 1.4 % MT LIQD: 1.0000 | OROMUCOSAL | Status: DC | PRN

## 2016-12-16 MED ORDER — ROSUVASTATIN CALCIUM 5 MG PO TABS
5.0000 mg | ORAL_TABLET | Freq: Every day | ORAL | Status: DC
  Administered 2016-12-16 – 2016-12-20 (×5): 5 mg via ORAL
  Filled 2016-12-16 (×5): qty 1

## 2016-12-16 MED ORDER — ACETAMINOPHEN 650 MG RE SUPP: 650.0000 mg | RECTAL | Status: DC | PRN

## 2016-12-16 MED ORDER — HYDROMORPHONE HCL 1 MG/ML IJ SOLN
1.0000 mg | INTRAMUSCULAR | Status: DC | PRN
  Administered 2016-12-16 – 2016-12-19 (×8): 1 mg via INTRAVENOUS
  Filled 2016-12-16 (×8): qty 1

## 2016-12-16 MED ORDER — PANTOPRAZOLE SODIUM 40 MG IV SOLR
40.0000 mg | Freq: Every day | INTRAVENOUS | Status: DC
  Administered 2016-12-16 – 2016-12-20 (×5): 40 mg via INTRAVENOUS
  Filled 2016-12-16 (×5): qty 40

## 2016-12-16 MED FILL — Sodium Chloride IV Soln 0.9%: INTRAVENOUS | Qty: 4000 | Status: AC

## 2016-12-16 MED FILL — Heparin Sodium (Porcine) Inj 1000 Unit/ML: INTRAMUSCULAR | Qty: 60 | Status: AC

## 2016-12-16 NOTE — Transfer of Care (Signed)
Immediate Anesthesia Transfer of Care Note  Patient: Kim Mcgee  Procedure(s) Performed: Procedure(s): DECOMPRESSIVE  LAMINECTOMY Thoracic twelve to lumbar one (N/A) Posterior Fusion Thoracic- ten to Lumbar- two (N/A)  Patient Location: PACU  Anesthesia Type:General  Level of Consciousness: drowsy  Airway & Oxygen Therapy: Patient Spontanous Breathing and Patient connected to face mask oxygen  Post-op Assessment: Report given to RN and Post -op Vital signs reviewed and stable  Post vital signs: Reviewed and stable  Last Vitals:  Vitals:   12/15/16 2115 12/15/16 2130  BP: (!) 173/74 (!) 158/57  Pulse: 79 72  Resp: 15 (!) 23  Temp:      Last Pain:  Vitals:   12/15/16 2115  TempSrc:   PainSc: 5          Complications: No apparent anesthesia complications

## 2016-12-16 NOTE — Op Note (Signed)
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 Possible lumbar epidural hematoma with instability T12-L1  Postoperative diagnosis: Severe lumbar spinal stenosis T12-L1 primarily from large synovial cyst and instability T12-L1  Procedure: #1 decompressive thoracic laminectomy T12-L1 with removal of large synovial cyst and foraminotomies at T12 and L1 nerve roots  #2 pedicle screw fixation T10, T11, T12 and utilizing the nuvasive relign Z rod addition set tying into his old construct from L2. With removal of bilateral loose L1 screws and placement of 5.5 x 40 mm pedicle screws at T10, T11, T12 bilaterally  #3 posterior lateral arthrodesis T10-L2 utilizing locally harvested autograft mixed with osteocel pro and BMP  Placement of large Hemovac drain  Repair of inadvertent dural tear  Surgeon: Dominica Severin Ryelee Albee  Asst.: Glenford Peers  Anesthesia: Gen.  EBL: Minimal less than 200 mL  History of present illness: Patient is a 55 showed female previous injury undergone L2-S1 fusion presented with 3-4 week history of progressive weakness of her left leg with a left-sided foot drop. She had a lumbar epidural steroid injection on Monday and over the last 24 hours is gotten progressively worse with increased weakness of her right looks to me I and inability to ambulate. Repeat MRI scan showed development of a cystic lesion dorsal to the thecal sac causing severe spinal stenosis and spinal cord compression. Due to patient progressive clinical syndrome imaging findings and failure conservative treatment I recommended decompressive thoracic laminectomy and fusion. I extensively reviewed the risks and benefits of the operation the patient as well as perioperative course expectations of outcome and alternatives of surgery and the family understood and agreed to proceed forward.   Operative procedure: Patient brought in the or was induced on general anesthesia positioned  prone the Wilson frame her back was prepped and draped in routine sterile fashion her old incision was opened up and extended cephalad subperiosteal dissection was carried out and exposed the lamina and TP complex from T10 down to below L2. I identified her old construct dissected that out removed the knots and Z rod at L1-L2 left the connector in place and removed to include bilateral loose L1 screws I then began the thoracic laminectomy removal of the T12 spinous process central decompression was begun with a 2 mm Kerrison punch a high-speed drill. Identified a large spur coming off the right side and the medial aspect of facet complex causing severe thecal sac compression and spinal cord compression or remove this decompressed the right-sided spinal cord then working to the left side there was a large synovial cyst teased this off of the dura and removed it 9 significant decompressing the spinal cord and thecal sac during removal of the large synovial cyst there was an inadvertent dural tear I primarily close this with 6-0 Prolene. Foraminotomies were performed at T12 and L1 and once I confirmed adequate decompression thecal sac at that level and had good dural repair with no evidence of spinal fluid leak, attention taken to pedicle screw placement. Utilizing high-speed drill and AP and lateral fluoroscopy pilot holes were drilled pedicles were cannulated probed tapped with a 55 Probed again and 6 5 x 40 screws were inserted bilaterally at T10, T11, T12. Then I sized up and 2Z rods and and cut them to size 1 and a being O short head cut another rod and then anchored all those in place with top tightening nuts then aggressively decorticated the lamina after copious irrigation and lateral facet complexes and TPs. I packed  the autograft mix and BMP posterior laterally from T10 down to L2. Then after confirmed all the hardware was in good position I utilized the DuraSeal green glue over the dural repair laid Gelfoam  over the dura I did place a Hemovac with plans to discontinue that in the morning secondary to large this of the incision. Then a Spenco vancomycin powder and the wound injected exparelll in the fascia and closed the wound primarily with interrupted Vicryls running 4 subcuticular Dermabond benzo and Steri-Strips and sterile dressing. At the end the case all needle counts sponge counts were correct.

## 2016-12-16 NOTE — Evaluation (Signed)
Clinical/Bedside Swallow Evaluation Patient Details  Name: Kim Mcgee MRN: [id number] Date of Birth: 20-Aug-1940  Today's Date: 12/16/2016 Time: SLP Start Time (ACUTE ONLY): 1435 SLP Stop Time (ACUTE ONLY): 1522 SLP Time Calculation (min) (ACUTE ONLY): 47 min  Past Medical History:  Past Medical History:  Diagnosis Date  . Anxiety   . Arthritis    lumbar DDD  . Asthma   . Cancer ([place])    basal cell CA  . Dysrhythmia   . GERD (gastroesophageal reflux disease)   . Hypertension   . Hypothyroidism   . Normal cardiac stress test   . PONV (postoperative nausea and vomiting)   . Shortness of breath    /w walking long distance   . Swallowing difficulty    being seen by ENT- had endoscopy- told that its probably related to past neck surgeries    Past Surgical History:  Past Surgical History:  Procedure Laterality Date  . APPENDECTOMY    . CARDIAC CATHETERIZATION     "long time ago"   . CERVICAL FUSION    . DECOMPRESSIVE LUMBAR LAMINECTOMY LEVEL 1 N/A 12/15/2016   Procedure: DECOMPRESSIVE  LAMINECTOMY Thoracic twelve to lumbar one;  Surgeon: Kary Kos, MD;  Location: [place];  Service: Neurosurgery;  Laterality: N/A;  . EYE SURGERY     L eye- cataract removed  - /w IOL   . POSTERIOR LUMBAR FUSION 4 LEVEL N/A 12/15/2016   Procedure: Posterior Fusion Thoracic- ten to Lumbar- two;  Surgeon: Kary Kos, MD;  Location: [place];  Service: Neurosurgery;  Laterality: N/A;  . TONSILLECTOMY    . TOTAL THYROIDECTOMY     1990's  . TUBAL LIGATION     HPI:  76 year old female admitted 12/15/16 with LLE numbness due to severe spinal cord compression at T12-L1. Pt underwent surgery 12/15/16 and must remain flat - no trendelenburg. 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 Back surgery was completed yesterday, and pt must remain absolutely flat for the next few days - no trendelenburg allowed. This SIGNIFICANTLY increases pt aspiration risk. Intermittent cough response noted after po trials (thin liquid and purees observed - solids not given, due to positioning and history of esophageal dysmotility). Pt was encouraged to choose soft items (mashed potatoes, soups, yogurt) and avoid solids, taking small bites and sips at a very slow rate. Pt indicated she underwent an objective swallow evaluation in May, and was scheduled to have it repeated in August. ST will continue to follow pt acutely for diet tolerance and education, and determination of whether objective study is needed at this time.   Pt aspiration risk is very high given history of pharyngeal and esophageal dysphagia and current positioning limitations.     SLP Visit Diagnosis: Dysphagia, pharyngoesophageal phase (R13.14)    Aspiration Risk  Severe aspiration risk    Diet Recommendation Regular;Thin liquid (choose soft items)   Liquid Administration via: Straw Medication Administration: Whole meds with puree Supervision: Patient able to self feed;Staff to assist with self feeding Compensations: Minimize environmental distractions;Slow rate;Small sips/bites Postural Changes:  (sit upright when able)    Other  Recommendations Oral Care Recommendations: Oral care QID Other Recommendations: Have oral suction available   Follow up Recommendations  (TBD)      Frequency and  Duration min 2x/week  1 week       Prognosis Prognosis for Safe Diet Advancement: Good      Swallow Study   General Date of Onset: 12/15/16 HPI: 76 year old female admitted 12/15/16 with LLE numbness due to severe spinal cord compression at T12-L1. Pt underwent surgery 12/15/16 and must remain flat - no trendelenburg. 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 Type of Study: Bedside Swallow Evaluation Previous Swallow Assessment: Pt reports swallow evaluation at [facility] in May 2018. Results unavailable. Pt's daughter gave Dr. Bettina Gavia number to acquire information Diet Prior to this Study: Regular;Thin liquids Temperature Spikes Noted: No Respiratory Status: Room air History of Recent Intubation: Yes Length of Intubations (days): 1 days (during surgery) Date extubated: 12/15/16 Behavior/Cognition: Cooperative;Pleasant mood;Alert Oral Cavity Assessment: Within Functional Limits Oral Care Completed by SLP: No Oral Cavity - Dentition: Adequate natural dentition Vision: Functional for self-feeding Self-Feeding Abilities: Able to feed self Patient Positioning: Postural control interferes with function (flat in bed) Baseline Vocal Quality: Normal Volitional Cough: Strong Volitional Swallow: Able to elicit    Oral/Motor/Sensory Function Overall Oral Motor/Sensory Function: Within functional limits   Ice Chips Ice chips: Not tested   Thin Liquid Thin Liquid: Impaired Presentation: Straw Pharyngeal  Phase Impairments: Cough - Delayed    Nectar Thick Nectar Thick Liquid: Not tested   Honey Thick Honey Thick Liquid: Not tested   Puree Puree: Within functional limits Presentation: Moran. Quentin Ore [facility], CCC-SLP [phone]  Solid: Not tested        Shonna Chock 12/16/2016,3:37 PM

## 2016-12-16 NOTE — Progress Notes (Signed)
Pt arrived from OR confused, eyes open grabbing for face, and tubes.  Pt only saying the word 'yep'.  VSS.  Grips equal and present BUE and strong.  Weak on lower extremities.  MD at bedside to check movement of extremities.  Will continue to monitor.

## 2016-12-16 NOTE — Progress Notes (Signed)
Pharmacy Antibiotic Note  Kim Mcgee is a 76 y.o. female admitted on 12/15/2016 with severe spinal stenosis/large synovial cyst.  Pharmacy has been consulted for Vancomycin dosing for surgical prophylaxis. Currently has drain in place. Vancomycin to continue until [place] by MD per protocol comments. WBC 11.5. Renal function OK.   Plan: Vancomycin 750 mg IV q12h F/U drain removal  Drug levels as indicated   Height: 5\' 2"  (157.5 cm) Weight: 164 lb (74.4 kg) IBW/kg (Calculated) : 50.1  Temp (24hrs), Avg:97.6 F (36.4 C), Min:97.5 F (36.4 C), Max:97.8 F (36.6 C)   Recent Labs Lab 12/15/16 1436  WBC 11.5*  CREATININE 0.67    Estimated Creatinine Clearance: 56.5 mL/min (by C-G formula based on SCr of 0.67 mg/dL).    Allergies  Allergen Reactions  . Macrodantin [Nitrofurantoin] Other (See Comments)    Chest pain  . Morphine And Related Nausea Only    CAUSED THE DRY HEAVES; CANNOT TAKE AGAIN!!  . Penicillins Rash and Other (See Comments)    Chest pain, also Has patient had a PCN reaction causing immediate rash, facial/tongue/throat swelling, SOB or lightheadedness with hypotension: Yes Has patient had a PCN reaction causing severe rash involving mucus membranes or skin necrosis: No Has patient had a PCN reaction that required hospitalization: No Has patient had a PCN reaction occurring within the last 10 years: Yes If all of the above answers are "NO", then may proceed with Cephalosporin use.   . Tape Rash    No adhesive tape, but [facility] WRAP IS TOLERATED     Narda Bonds 12/16/2016 4:06 AM

## 2016-12-17 ENCOUNTER — Inpatient Hospital Stay (HOSPITAL_COMMUNITY): Payer: Medicare Other

## 2016-12-17 DIAGNOSIS — I25119 Atherosclerotic heart disease of native coronary artery with unspecified angina pectoris: Secondary | ICD-10-CM

## 2016-12-17 DIAGNOSIS — I2510 Atherosclerotic heart disease of native coronary artery without angina pectoris: Secondary | ICD-10-CM | POA: Diagnosis present

## 2016-12-17 DIAGNOSIS — I10 Essential (primary) hypertension: Secondary | ICD-10-CM | POA: Diagnosis present

## 2016-12-17 DIAGNOSIS — R0600 Dyspnea, unspecified: Secondary | ICD-10-CM | POA: Diagnosis present

## 2016-12-17 DIAGNOSIS — Z419 Encounter for procedure for purposes other than remedying health state, unspecified: Secondary | ICD-10-CM

## 2016-12-17 DIAGNOSIS — E039 Hypothyroidism, unspecified: Secondary | ICD-10-CM | POA: Diagnosis present

## 2016-12-17 DIAGNOSIS — E7849 Other hyperlipidemia: Secondary | ICD-10-CM | POA: Diagnosis present

## 2016-12-17 DIAGNOSIS — E7800 Pure hypercholesterolemia, unspecified: Secondary | ICD-10-CM | POA: Diagnosis present

## 2016-12-17 LAB — BASIC METABOLIC PANEL
ANION GAP: 8 (ref 5–15)
BUN: 13 mg/dL (ref 6–20)
CO2: 28 mmol/L (ref 22–32)
Calcium: 9.1 mg/dL (ref 8.9–10.3)
Chloride: 105 mmol/L (ref 101–111)
Creatinine, Ser: 0.66 mg/dL (ref 0.44–1.00)
GFR calc Af Amer: >60 mL/min (ref >60)
Glucose, Bld: 134 mg/dL — ABNORMAL HIGH (ref 65–99)
POTASSIUM: 4.4 mmol/L (ref 3.5–5.1)
SODIUM: 141 mmol/L (ref 135–145)

## 2016-12-17 LAB — TROPONIN I
Troponin I: <0.03 ng/mL (ref <0.03)
Troponin I: <0.03 ng/mL (ref <0.03)
Troponin I: <0.03 ng/mL (ref <0.03)

## 2016-12-17 LAB — CBC WITH DIFFERENTIAL/PLATELET
BASOS ABS: 0 10*3/uL (ref 0.0–0.1)
Basophils Relative: 0 %
EOS ABS: 0 10*3/uL (ref 0.0–0.7)
EOS PCT: 0 %
HCT: 35.6 % — ABNORMAL LOW (ref 36.0–46.0)
HEMOGLOBIN: 11.6 g/dL — AB (ref 12.0–15.0)
LYMPHS ABS: 2.5 10*3/uL (ref 0.7–4.0)
LYMPHS PCT: 18 %
MCH: 29.1 pg (ref 26.0–34.0)
MCHC: 32.6 g/dL (ref 30.0–36.0)
MCV: 89.2 fL (ref 78.0–100.0)
Monocytes Absolute: 1.3 10*3/uL — ABNORMAL HIGH (ref 0.1–1.0)
Monocytes Relative: 9 %
NEUTROS PCT: 73 %
Neutro Abs: 10.6 10*3/uL — ABNORMAL HIGH (ref 1.7–7.7)
PLATELETS: 209 10*3/uL (ref 150–400)
RBC: 3.99 MIL/uL (ref 3.87–5.11)
RDW: 15 % (ref 11.5–15.5)
WBC: 14.5 10*3/uL — AB (ref 4.0–10.5)

## 2016-12-17 LAB — ECHOCARDIOGRAM COMPLETE
HEIGHTINCHES: 62 in
WEIGHTICAEL: 2624 [oz_av]

## 2016-12-17 LAB — BRAIN NATRIURETIC PEPTIDE: B NATRIURETIC PEPTIDE 5: 119.1 pg/mL — AB (ref 0.0–100.0)

## 2016-12-17 NOTE — Progress Notes (Signed)
Subjective: Patient reports Overall patient says significant permanent lower extremities feeling a little short of breath but otherwise doing okay  Objective: Vital signs in last 24 hours: Temp:  [97.6 F (36.4 C)-98.3 F (36.8 C)] 97.6 F (36.4 C) (07/20 0456) Pulse Rate:  [58-76] 58 (07/20 0456) Resp:  [18-20] 18 (07/20 0456) BP: (109-150)/(45-57) 109/45 (07/20 0456) SpO2:  [95 %-96 %] 95 % (07/20 0456)  Intake/Output from previous day: 07/19 0701 - 07/20 0700 In: 450 [P.O.:450] Out: 2175 [Urine:1800; Drains:375] Intake/Output this shift: Total I/O In: -  Out: 625 [Urine:500; Drains:125]  Significant for right lower cavity strength now over antigravity left lower extremity improved dorsiflexion left foot right foot has significantly improved strength of 4-4+ out of 5 incision clean dry and intact  Lab Results:  Recent Labs  12/15/16 1436  WBC 11.5*  HGB 14.1  HCT 43.6  PLT 225   BMET  Recent Labs  12/15/16 1436 12/16/16 2149  NA 141 140  K 4.0 4.5  CL 104 103  CO2 26 29  GLUCOSE 93 180*  BUN 13 10  CREATININE 0.67 0.69  CALCIUM 9.5 9.3    Studies/Results: Dg Thoracolumabar Spine  Result Date: 12/16/2016 CLINICAL DATA:  Laminectomy. EXAM: DG C-ARM 61-120 MIN; THORACOLUMBAR SPINE - 2 VIEW COMPARISON:  CT 12/15/2016. FINDINGS: Postsurgical changes noted of the thoracolumbar spine. Surgical retractors and sponge noted over the posterior back . Visualized hardware is intact. Anatomic alignment. IMPRESSION: Postsurgical changes thoracolumbar spine. Electronically Signed   By: Marcello Moores  Register   On: 12/16/2016 07:25   Dg Ankle Complete Right  Result Date: 12/15/2016 CLINICAL DATA:  Fall today.  Ankle pain EXAM: RIGHT ANKLE - COMPLETE 3+ VIEW COMPARISON:  None. FINDINGS: Normal alignment. Negative for fracture. Small joint effusion. Lateral soft tissue swelling. Large calcaneal spur on the plantar surface IMPRESSION: Small joint effusion.  Negative for fracture  Electronically Signed   By: Franchot Gallo M.D.   On: 12/15/2016 18:49   Ct Thoracic Spine Wo Contrast  Result Date: 12/15/2016 CLINICAL DATA:  Lower extremity weakness. History of spinal surgery. EXAM: CT THORACIC AND LUMBAR SPINE WITHOUT CONTRAST TECHNIQUE: Multidetector CT imaging of the thoracic and lumbar spine was performed without contrast. Multiplanar CT image reconstructions were also generated. COMPARISON:  Lumbar spine MRI 12/15/2016 FINDINGS: CT THORACIC SPINE FINDINGS Alignment: Normal. Vertebrae: There is flowing anterior osteophyte formation at all thoracic levels, worst at T2-T3. No acute fracture. There is posterior spinal fusion with intervertebral disc spaces extending from L1 and L5. There is mild lucency surrounding the L1 screws. No abnormal lucency at the other levels. Paraspinal and other soft tissues: Negative. Disc levels: At T1-T2, there is a large central posterior osteophyte that causes moderate spinal canal stenosis and indents the ventral spinal cord. There is otherwise no spinal canal stenosis. The facet hypertrophy and endplate spurring cause moderate to severe foraminal stenosis at the right T2-T6 levels. There are a few loculated foci of gas in the dorsal spinal canal, which are nonspecific. CT LUMBAR SPINE FINDINGS Segmentation: Normal Alignment: Grade 1 L4-L5 anterolisthesis. Vertebrae: Large osteophytes at nearly all levels. No acute compression fracture. Paraspinal and other soft tissues: Negative. Disc levels: The disc spaces are better evaluated on the concomitant MRI. At the T12-L1 level, there is severe spinal canal stenosis, as previously demonstrated. There is gas within the disc space with a small amount of gas along the ventral aspect of the spinal canal. This may aid provided source for gas elsewhere in the  spinal canal. The L2-L5 levels are decompressed posteriorly. There is a solid posterior fusion mass. There is anterior fusion at L1-L2 and L3-L5. Visualized  sacrum: Normal. IMPRESSION: 1. 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 2. Mild lucency surrounding both L1 transpedicular screws, greater on the right. This suggest a mild degree of loosening. 3. Moderate spinal canal stenosis at the T1-T2 level secondary to large posterior osteophyte. 4. Nonspecific pneumorrhachis, which is often detected incidentally and has numerous possible etiologies. In this case, the foci of gas in the spinal canal may have been released from disc vacuum phenomenon at the T12-L1 disc space. There is no associated compression of the thecal sac or other acute finding. 5. Moderate to severe narrowing of the right neural foramina at the T2-T6 levels. Electronically Signed   By: Ulyses Jarred M.D.   On: 12/15/2016 22:52   Ct Lumbar Spine Wo Contrast  Result Date: 12/15/2016 CLINICAL DATA:  Lower extremity weakness. History of spinal surgery. EXAM: CT THORACIC AND LUMBAR SPINE WITHOUT CONTRAST TECHNIQUE: Multidetector CT imaging of the thoracic and lumbar spine was performed without contrast. Multiplanar CT image reconstructions were also generated. COMPARISON:  Lumbar spine MRI 12/15/2016 FINDINGS: CT THORACIC SPINE FINDINGS Alignment: Normal. Vertebrae: There is flowing anterior osteophyte formation at all thoracic levels, worst at T2-T3. No acute fracture. There is posterior spinal fusion with intervertebral disc spaces extending from L1 and L5. There is mild lucency surrounding the L1 screws. No abnormal lucency at the other levels. Paraspinal and other soft tissues: Negative. Disc levels: At T1-T2, there is a large central posterior osteophyte that causes moderate spinal canal stenosis and indents the ventral spinal cord. There is otherwise no spinal canal stenosis. The facet hypertrophy and endplate spurring cause moderate to severe foraminal stenosis at the right T2-T6 levels. There are a few loculated foci of gas in  the dorsal spinal canal, which are nonspecific. CT LUMBAR SPINE FINDINGS Segmentation: Normal Alignment: Grade 1 L4-L5 anterolisthesis. Vertebrae: Large osteophytes at nearly all levels. No acute compression fracture. Paraspinal and other soft tissues: Negative. Disc levels: The disc spaces are better evaluated on the concomitant MRI. At the T12-L1 level, there is severe spinal canal stenosis, as previously demonstrated. There is gas within the disc space with a small amount of gas along the ventral aspect of the spinal canal. This may aid provided source for gas elsewhere in the spinal canal. The L2-L5 levels are decompressed posteriorly. There is a solid posterior fusion mass. There is anterior fusion at L1-L2 and L3-L5. Visualized sacrum: Normal. IMPRESSION: 1. 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 2. Mild lucency surrounding both L1 transpedicular screws, greater on the right. This suggest a mild degree of loosening. 3. Moderate spinal canal stenosis at the T1-T2 level secondary to large posterior osteophyte. 4. Nonspecific pneumorrhachis, which is often detected incidentally and has numerous possible etiologies. In this case, the foci of gas in the spinal canal may have been released from disc vacuum phenomenon at the T12-L1 disc space. There is no associated compression of the thecal sac or other acute finding. 5. Moderate to severe narrowing of the right neural foramina at the T2-T6 levels. Electronically Signed   By: Ulyses Jarred M.D.   On: 12/15/2016 22:52   Mr Lumbar Spine Wo Contrast  Result Date: 12/15/2016 CLINICAL DATA:  Initial evaluation for acute on chronic back pain  with left-sided foot drop, status post recent epidural steroid injection, now with increased pain and episode of legs giving out. EXAM: MRI LUMBAR SPINE WITHOUT CONTRAST TECHNIQUE: Multiplanar, multisequence MR imaging of the lumbar spine was performed. No  intravenous contrast was administered. COMPARISON:  Comparison made with most recent MRI from 11/24/2016. FINDINGS: Segmentation: Normal segmentation. Lowest well-formed disc is labeled the L5-S1 level. Same numbering system is employed as on previous exams. Alignment: Chronic 5 mm anterolisthesis of L4 on L5, stable from previous. Mild levoscoliosis. Vertebral bodies otherwise normally aligned with preservation of the normal lumbar lordosis. Vertebrae: Vertebral body heights are maintained. No evidence for acute or interval fracture. Extensive postsurgical changes from previous posterior fusion at L1 through L5, with previous interbody fusion at L1-2, L3-4, and L4-5. Reactive endplate changes noted about the T12-L1 interspace with prominent bulky anterior osteophytic spurring. Signal intensity within the vertebral body bone marrow is within normal limits. No discrete or worrisome osseous lesions. Conus medullaris: Extends to the L2 level and appears normal. Paraspinal and other soft tissues: Postsurgical changes with fatty atrophy present within the posterior paraspinous musculature. Similar postoperative fluid collection at the laminectomy defect extending from L2 through L5. Scattered parapelvic cyst noted within the kidneys bilaterally. Visualized visceral structures otherwise unremarkable. Disc levels: T10-11: Seen only on sagittal projection. Mild diffuse disc bulge. No canal or foraminal stenosis. T11-12: Seen only on sagittal projection. Mild disc bulge. No canal or foraminal stenosis. T12-L1: Diffuse degenerative disc bulge with disc desiccation. Reactive endplate changes. Broad posterior component flattens and largely effaces the ventral thecal sac. Superimposed moderate facet arthrosis with ligamentum flavum thickening. Small reactive effusions within the bilateral T12-L1 facets. There is a superimposed synovial cyst at the anteromedial aspect of the left T12-L1 facet measuring 5 mm (series 5, image 4).  This appears slightly increased in size relative to most recent exam (previously 4 mm). Additional small synovial cyst slightly inferiorly and just to the right of midline at the posterior thecal sac measures 4 mm (series 5, image 5). There is resultant severe canal stenosis with impingement on the distal spinal cord which is compressed and shifted to the right (series 5, image 4). Thecal sac measures 5 mm in AP diameter at its most narrow point (previously 7 mm on 11/24/2016). No associated cord signal changes. Moderate to advanced bilateral T12 foraminal narrowing due to disc bulge, endplate changes, and facet disease present as well. This is slightly worse on the left. L1-2: Postoperative changes from posterior and interbody fusion. Residual central disc extrusion with inferior migration (series 5, image 11). No canal or subarticular stenosis. Foramina appear patent. L2-3: Mild diffuse disc bulge with a marginal endplate osteophytes. Prior posterior decompression with fusion. No residual canal stenosis. Foramina appear patent. L3-4: Prior posterior and interbody fusion. No residual canal or foraminal stenosis. L4-5: Chronic 5 mm anterolisthesis. Prior posterior and interbody fusion. No residual canal stenosis. The L4 foramina are elongated but remain patent due to slippage, stable. L5-S1: Chronic degenerative intervertebral disc space narrowing with disc desiccation and diffuse disc bulge. Superimposed marginal endplate osteophytes. Disc osteophyte indents the ventral thecal sac, stable from previous. Resultant mild lateral recess stenosis without frank impingement of the descending S1 nerve roots. Moderate facet arthrosis. Mild left L5 foraminal narrowing. IMPRESSION: 1. Multifactorial degenerative changes with resultant severe canal stenosis and cord compression at the T12-L1 level as detailed above, slightly worsened as compared to recent MRI from 11/24/2016. No cord signal changes. Neuro surgical consultation  recommended. 2. Extensive postoperative  changes from previous posterior and interbody fusion at L1 through L5 as above, otherwise stable in appearance, with no new stenosis identified. Results were called by telephone at the time of interpretation on 12/15/2016 at 8:35 pm to Dr. Davonna Belling , who verbally acknowledged these results. Electronically Signed   By: Jeannine Boga M.D.   On: 12/15/2016 20:38   Dg C-arm 1-60 Min  Result Date: 12/16/2016 CLINICAL DATA:  Laminectomy. EXAM: DG C-ARM 61-120 MIN; THORACOLUMBAR SPINE - 2 VIEW COMPARISON:  CT 12/15/2016. FINDINGS: Postsurgical changes noted of the thoracolumbar spine. Surgical retractors and sponge noted over the posterior back . Visualized hardware is intact. Anatomic alignment. IMPRESSION: Postsurgical changes thoracolumbar spine. Electronically Signed   By: Marcello Moores  Register   On: 12/16/2016 07:25    Assessment/Plan: Postop day 1 from decompressive thoracic laminectomy and T10 L2 fusion patient making significant strides and improvement keep flat bedrest 1 more day start raising head of bed Saturday morning will need physical therapy and outpatient therapy ordered that time as well as recommend rehabilitation consult. Shortness of breath is concerning him to contact the medicine hospitalist  for a consult and order a chest x-ray.  LOS: 2 days     Brandyn Thien P 12/17/2016, 9:13 AM

## 2016-12-17 NOTE — Progress Notes (Signed)
  Echocardiogram 2D Echocardiogram has been performed.  Kim Mcgee 12/17/2016, 5:03 PM

## 2016-12-17 NOTE — Progress Notes (Signed)
  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  Pt aware of swallowing strategies/aspiration precautions with previous hx of dysphagia and is using during meals/PO intake at home prior to recent surgery.  Pt without overt s/s of aspiration noted during intake and modified independent use of swallowing strategies/aspiration precautions.  ST will continue to f/u during in house acute stay for dysphagia tx.  HPI HPI: 76 year old female admitted 12/15/16 with LLE numbness due to severe spinal cord compression at T12-L1. Pt underwent surgery 12/15/16 and must remain flat - no trendelenburg. 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      SLP Plan  Continue with current plan of care       Recommendations  Diet recommendations: Dysphagia 3 (mechanical soft);Thin liquid (as tolerated) Liquids provided via: Straw Medication Administration: Whole meds with puree Supervision: Intermittent supervision to cue for compensatory strategies Compensations: Minimize environmental distractions;Slow rate;Small sips/bites Postural Changes and/or Swallow Maneuvers: Head turn right during swallow                Oral Care Recommendations: Oral care QID Follow up Recommendations: Other (comment) (TBD) SLP Visit  Diagnosis: Dysphagia, pharyngoesophageal phase (R13.14) Plan: Continue with current plan of care                      Elvina Sidle, M.S., CCC-SLP 12/17/2016, 3:22 PM

## 2016-12-17 NOTE — Consult Note (Signed)
Consultation Note   Kim Mcgee HDQ:[id number] DOB: 1941/05/05 DOA: 12/15/2016   PCP: Kennieth Rad, MD   Supervising physician: Nehemiah Settle  Requesting physician: Dr. Saintclair Halsted  Reason for consultation: SOB  HPI: Kim Mcgee is a 76 y.o. female with medical history significant for single vessel CAD with stent on Plavix, asthma, HLD, hypertension, hypothyroidism, and arthritis. Patient was admitted by the neurosurgery team on 7/18 after having progressive low back pain associated with lower extremity weakness and pain left greater than right. She was not experiencing any bowel or bladder incontinence. MRI revealed suspected hematoma causing severe spinal cord compression at the T12-L1 level and worse then MRI 3-4 weeks prior. Patient was subsequently urgently taken to the OR on 7/19 where she underwent decompressive laminectomy. She was found to have a dural tear and postoperatively she has remained on supine bedrest i.e. unable to raise head of bed. Since her surgery she has developed progressive dyspnea without hypoxia. Neurosurgery requests internal medicine evaluation regarding her shortness of breath.  Review of Systems:  In addition to the HPI above,  No Fever-chills, myalgias or other constitutional symptoms No Headache, changes with Vision or hearing, new weakness, tingling, numbness in any extremity, dizziness, dysarthria or word finding difficulty,, tremors or seizure activity No problems swallowing food or Liquids, indigestion/reflux, choking or coughing while eating, abdominal pain with or after eating No Chest pain,  palpitations, DOE No Abdominal pain, N/V, melena,hematochezia, dark tarry stools, constipation No dysuria, malodorous urine, hematuria or flank pain No new skin rashes, lesions, masses or bruises, No new joint pains, aches, swelling or redness No recent unintentional weight gain or loss No polyuria, polydypsia or polyphagia   Past Medical History:  Diagnosis Date    . Anxiety   . Arthritis    lumbar DDD  . Asthma   . Basal cell carcinoma of nose   . Chronic lower back pain   . Coronary artery disease   . Dysrhythmia   . GERD (gastroesophageal reflux disease)   . High cholesterol   . Hypertension   . Hypothyroidism   . Normal cardiac stress test   . PONV (postoperative nausea and vomiting)   . Shortness of breath    /w walking long distance   . Swallowing difficulty    being seen by ENT- had endoscopy- told that its probably related to past neck surgeries     Past Surgical History:  Procedure Laterality Date  . ANTERIOR CERVICAL DECOMP/DISCECTOMY FUSION  2010  . APPENDECTOMY    . BACK SURGERY    . BASAL CELL CARCINOMA EXCISION     "cut off nose"  . CARDIAC CATHETERIZATION  1970s; 1990s?; 2017  . CATARACT EXTRACTION W/ INTRAOCULAR LENS  IMPLANT, BILATERAL Bilateral   . CORONARY ANGIOPLASTY WITH STENT PLACEMENT  ~ 2017  . DECOMPRESSIVE LUMBAR LAMINECTOMY LEVEL 1 N/A 12/15/2016   Procedure: DECOMPRESSIVE  LAMINECTOMY Thoracic twelve to lumbar one;  Surgeon: Kary Kos, MD;  Location: [facility];  Service: Neurosurgery;  Laterality: N/A;  . DILATION AND CURETTAGE OF UTERUS    . OOPHORECTOMY     "?side"  . POSTERIOR FUSION LUMBAR SPINE  ~ 2013; 2016  . POSTERIOR LUMBAR FUSION 4 LEVEL N/A 12/15/2016   Procedure: Posterior Fusion Thoracic- ten to Lumbar- two;  Surgeon: Kary Kos, MD;  Location: [facility];  Service: Neurosurgery;  Laterality: N/A;  . TONSILLECTOMY    . TOTAL THYROIDECTOMY  1990's  . TUBAL LIGATION      Social History   Social History  .  Marital status: Married    Spouse name: N/A  . Number of children: N/A  . Years of education: N/A   Occupational History  . Not on file.   Social History Main Topics  . Smoking status: Never Smoker  . Smokeless tobacco: Never Used  . Alcohol use No  . Drug use: No  . Sexual activity: Not on file   Other Topics Concern  . Not on file   Social History Narrative  . No narrative on  file     Allergies  Allergen Reactions  . Macrodantin [Nitrofurantoin] Other (See Comments)    Chest pain  . Morphine And Related Nausea Only    CAUSED THE DRY HEAVES; CANNOT TAKE AGAIN!!  . Penicillins Rash and Other (See Comments)    Chest pain, also Has patient had a PCN reaction causing immediate rash, facial/tongue/throat swelling, SOB or lightheadedness with hypotension: Yes Has patient had a PCN reaction causing severe rash involving mucus membranes or skin necrosis: No Has patient had a PCN reaction that required hospitalization: No Has patient had a PCN reaction occurring within the last 10 years: Yes If all of the above answers are "NO", then may proceed with Cephalosporin use.   . Tape Rash    No adhesive tape, but COBAN WRAP IS TOLERATED    Family history reviewed and not pertinent to current admission diagnosis/current postoperative symptoms  Prior to Admission medications   Medication Sig Start Date End Date Taking? Authorizing Provider  acetaminophen (TYLENOL) 325 MG tablet Take 325-650 mg by mouth every 6 (six) hours as needed (for pain or headaches).    Yes [provider]  albuterol (PROVENTIL HFA;VENTOLIN HFA) 108 (90 BASE) MCG/ACT inhaler Inhale 2 puffs into the lungs every 6 (six) hours as needed for shortness of breath.    Yes [provider]  cephALEXin (KEFLEX) 500 MG capsule Take 500 mg by mouth See admin instructions. DAY BEFORE/DAY OF/DAY AFTER DENTAL APPOINTMENTS 12/05/16  Yes [provider]  clopidogrel (PLAVIX) 75 MG tablet Take 75 mg by mouth daily. 09/01/16  Yes [provider]  cyclobenzaprine (FLEXERIL) 10 MG tablet Take 1 tablet (10 mg total) by mouth 3 (three) times daily as needed for muscle spasms. For pain Patient taking differently: Take 10 mg by mouth 3 (three) times daily as needed (for muscle spasms or pain).  08/03/14  Yes [name], MD  fexofenadine (ALLEGRA) 180 MG tablet Take 180 mg by mouth daily as  needed for allergies. 10/06/09  Yes [provider]  Fluticasone-Salmeterol (ADVAIR) 500-50 MCG/DOSE AEPB Inhale 1 puff into the lungs 2 (two) times daily as needed (for seasonal "flares").    Yes [provider]  gabapentin (NEURONTIN) 300 MG capsule Take 300 mg by mouth 3 (three) times daily. 11/09/16  Yes [provider]  HYDROcodone-acetaminophen (NORCO/VICODIN) 5-325 MG tablet Take 1 tablet by mouth every 4 (four) hours as needed. Patient taking differently: Take 1 tablet by mouth every 4 (four) hours as needed (for pain).  12/04/16  Yes Tanna Furry, MD  levothyroxine (SYNTHROID, LEVOTHROID) 112 MCG tablet Take 112 mcg by mouth daily before breakfast.   Yes [provider]  methocarbamol (ROBAXIN) 500 MG tablet Take 1 tablet (500 mg total) by mouth 3 (three) times daily between meals as needed. Patient taking differently: Take 500 mg by mouth 3 (three) times daily as needed for muscle spasms.  12/04/16  Yes Tanna Furry, MD  metoprolol succinate (TOPROL-XL) 50 MG 24 hr tablet  Take 50 mg by mouth 2 (two) times daily. 11/11/16  Yes [provider]  nitroGLYCERIN (NITROSTAT) 0.4 MG SL tablet Place 0.4 mg under the tongue every 5 (five) minutes x 3 doses as needed for chest pain. 07/05/09  Yes [provider]  Omega-3 Fatty Acids (FISH OIL PO) Take 1 capsule by mouth daily. 05/29/10  Yes [provider]  ondansetron (ZOFRAN ODT) 4 MG disintegrating tablet Take 1 tablet (4 mg total) by mouth every 8 (eight) hours as needed for nausea. 12/04/16  Yes Tanna Furry, MD  oxazepam (SERAX) 15 MG capsule Take 15 mg by mouth at bedtime as needed for anxiety.  09/07/16  Yes [provider]  pantoprazole (PROTONIX) 40 MG tablet Take 40 mg by mouth daily before breakfast. 09/16/16  Yes [provider]  polyethylene glycol (MIRALAX / GLYCOLAX) packet Take 17 g by mouth daily. 02/23/12  Yes [name], MD  rosuvastatin (CRESTOR) 5 MG tablet Take 5  mg by mouth at bedtime.  09/15/16  Yes [provider]  Tamsulosin HCl (FLOMAX) 0.4 MG CAPS Take 1 capsule (0.4 mg total) by mouth daily. 02/23/12  Yes [name], MD  traMADol (ULTRAM) 50 MG tablet Take 50 mg by mouth every 6 (six) hours as needed for pain. 11/25/16  Yes [provider]  bethanechol (URECHOLINE) 25 MG tablet Take 1 tablet (25 mg total) by mouth 3 (three) times daily. Patient not taking: Reported on 12/15/2016 02/23/12   [name], MD  dexamethasone (DECADRON) 4 MG tablet Take 1 tablet (4 mg total) by mouth 2 (two) times daily. Patient not taking: Reported on 12/15/2016 12/04/16   Tanna Furry, MD  oxyCODONE-acetaminophen (PERCOCET/ROXICET) 5-325 MG per tablet Take 1-2 tablets by mouth every 4 (four) hours as needed. Patient not taking: Reported on 12/15/2016 02/17/12   [name], MD  oxyCODONE-acetaminophen (PERCOCET/ROXICET) 5-325 MG per tablet Take 1-2 tablets by mouth every 4 (four) hours as needed for moderate pain. Patient not taking: Reported on 12/15/2016 07/31/14   [name], MD  oxyCODONE-acetaminophen (PERCOCET/ROXICET) 5-325 MG per tablet Take 1-2 tablets by mouth every 6 (six) hours as needed for moderate pain. Patient not taking: Reported on 12/15/2016 08/03/14   [name], MD  pregabalin (LYRICA) 75 MG capsule Take 1 capsule (75 mg total) by mouth 2 (two) times daily. Patient not taking: Reported on 12/15/2016 02/23/12   [name], MD    Physical Exam: Vitals:   12/16/16 2008 12/16/16 2126 12/17/16 0456 12/17/16 0914  BP: (!) 137/53  (!) 109/45   Pulse: 62 76 (!) 58   Resp: 18 18 18    Temp: 98.3 F (36.8 C)  97.6 F (36.4 C)   TempSrc: Oral  Oral   SpO2: 95% 95% 95% 98%  Weight:      Height:          Constitutional: NAD, calm, comfortable Eyes: PERRL, lids and conjunctivae normal ENMT: Mucous membranes are moist. Posterior pharynx clear of any exudate or lesions.Normal dentition.  Neck: normal, supple, no masses, no  thyromegaly Respiratory: clear to auscultation bilaterally, no wheezing, no crackles.The nystatin bases Normal respiratory effort. No accessory muscle use. Room air saturation 96-97%, 2 L O2 saturation 98-100% Cardiovascular: Regular rate and rhythm, no murmurs / rubs / gallops. No extremity edema. 2+ pedal pulses. No carotid bruits.  Abdomen: no tenderness, no masses palpated. No hepatosplenomegaly. Bowel sounds positive.  Musculoskeletal: no clubbing / cyanosis. No joint deformity upper and lower extremities. Good ROM, no contractures.  Normal muscle tone.  Skin: no rashes, lesions, ulcers. No induration Neurologic: CN 2-12 grossly intact. Sensation intact, DTR normal. Strength 5/5Upper extremities, right lower extremity dorsiflexion 3-4+/5, left lower sternal dorsiflexion 1-2+/5  Psychiatric: Normal judgment and insight. Alert and oriented x 3. Normal mood.    Labs on Admission: I have personally reviewed following labs and imaging studies  CBC:  Recent Labs Lab 12/15/16 1436  WBC 11.5*  HGB 14.1  HCT 43.6  MCV 90.3  PLT 287   Basic Metabolic Panel:  Recent Labs Lab 12/15/16 1436 12/16/16 2149  NA 141 140  K 4.0 4.5  CL 104 103  CO2 26 29  GLUCOSE 93 180*  BUN 13 10  CREATININE 0.67 0.69  CALCIUM 9.5 9.3   GFR: Estimated Creatinine Clearance: 56.5 mL/min (by C-G formula based on SCr of 0.69 mg/dL). Liver Function Tests: No results for input(s): AST, ALT, ALKPHOS, BILITOT, PROT, ALBUMIN in the last 168 hours. No results for input(s): LIPASE, AMYLASE in the last 168 hours. No results for input(s): AMMONIA in the last 168 hours. Coagulation Profile:  Recent Labs Lab 12/15/16 1436  INR 0.90   Cardiac Enzymes: No results for input(s): CKTOTAL, CKMB, CKMBINDEX, TROPONINI in the last 168 hours. BNP (last 3 results) No results for input(s): PROBNP in the last 8760 hours. HbA1C: No results for input(s): HGBA1C in the last 72 hours. CBG: No results for input(s):  GLUCAP in the last 168 hours. Lipid Profile: No results for input(s): CHOL, HDL, LDLCALC, TRIG, CHOLHDL, LDLDIRECT in the last 72 hours. Thyroid Function Tests: No results for input(s): TSH, T4TOTAL, FREET4, T3FREE, THYROIDAB in the last 72 hours. Anemia Panel: No results for input(s): VITAMINB12, FOLATE, FERRITIN, TIBC, IRON, RETICCTPCT in the last 72 hours. Urine analysis:    Component Value Date/Time   COLORURINE YELLOW 02/20/2012 1537   APPEARANCEUR CLOUDY (A) 02/20/2012 1537   LABSPEC 1.010 02/20/2012 1537   PHURINE 6.5 02/20/2012 1537   GLUCOSEU NEGATIVE 02/20/2012 1537   HGBUR TRACE (A) 02/20/2012 1537   BILIRUBINUR NEGATIVE 02/20/2012 1537   KETONESUR NEGATIVE 02/20/2012 1537   PROTEINUR NEGATIVE 02/20/2012 1537   UROBILINOGEN 0.2 02/20/2012 1537   NITRITE NEGATIVE 02/20/2012 1537   LEUKOCYTESUR MODERATE (A) 02/20/2012 1537   Sepsis Labs: @LABRCNTIP (procalcitonin:4,lacticidven:4) )No results found for this or any previous visit (from the past 240 hour(s)).   Radiological Exams on Admission: Dg Chest 1 View  Result Date: 12/17/2016 CLINICAL DATA:  Shortness of breath.  Post back surgery / fusion. EXAM: CHEST 1 VIEW COMPARISON:  Chest CT - 02/09/2012 FINDINGS: Grossly unchanged cardiac silhouette and mediastinal contours. Minimal bibasilar opacities favored to represent atelectasis or scar. No discrete focal airspace opacities. No pleural effusion or pneumothorax. No evidence of edema. Post paraspinal lower thoracic / lumbar fusion and cervical ACDF, incompletely evaluated. IMPRESSION: Minimal bibasilar atelectasis without superimposed acute cardiopulmonary disease on this supine AP portable examination. Electronically Signed   By: Sandi Mariscal M.D.   On: 12/17/2016 10:19   Dg Thoracolumabar Spine  Result Date: 12/16/2016 CLINICAL DATA:  Laminectomy. EXAM: DG C-ARM 61-120 MIN; THORACOLUMBAR SPINE - 2 VIEW COMPARISON:  CT 12/15/2016. FINDINGS: Postsurgical changes noted of the  thoracolumbar spine. Surgical retractors and sponge noted over the posterior back . Visualized hardware is intact. Anatomic alignment. IMPRESSION: Postsurgical changes thoracolumbar spine. Electronically Signed   By: Marcello Moores  Register   On: 12/16/2016 07:25   Dg Ankle Complete Right  Result Date: 12/15/2016 CLINICAL DATA:  Fall today.  Ankle pain EXAM:  RIGHT ANKLE - COMPLETE 3+ VIEW COMPARISON:  None. FINDINGS: Normal alignment. Negative for fracture. Small joint effusion. Lateral soft tissue swelling. Large calcaneal spur on the plantar surface IMPRESSION: Small joint effusion.  Negative for fracture Electronically Signed   By: Franchot Gallo M.D.   On: 12/15/2016 18:49   Ct Thoracic Spine Wo Contrast  Result Date: 12/15/2016 CLINICAL DATA:  Lower extremity weakness. History of spinal surgery. EXAM: CT THORACIC AND LUMBAR SPINE WITHOUT CONTRAST TECHNIQUE: Multidetector CT imaging of the thoracic and lumbar spine was performed without contrast. Multiplanar CT image reconstructions were also generated. COMPARISON:  Lumbar spine MRI 12/15/2016 FINDINGS: CT THORACIC SPINE FINDINGS Alignment: Normal. Vertebrae: There is flowing anterior osteophyte formation at all thoracic levels, worst at T2-T3. No acute fracture. There is posterior spinal fusion with intervertebral disc spaces extending from L1 and L5. There is mild lucency surrounding the L1 screws. No abnormal lucency at the other levels. Paraspinal and other soft tissues: Negative. Disc levels: At T1-T2, there is a large central posterior osteophyte that causes moderate spinal canal stenosis and indents the ventral spinal cord. There is otherwise no spinal canal stenosis. The facet hypertrophy and endplate spurring cause moderate to severe foraminal stenosis at the right T2-T6 levels. There are a few loculated foci of gas in the dorsal spinal canal, which are nonspecific. CT LUMBAR SPINE FINDINGS Segmentation: Normal Alignment: Grade 1 L4-L5 anterolisthesis.  Vertebrae: Large osteophytes at nearly all levels. No acute compression fracture. Paraspinal and other soft tissues: Negative. Disc levels: The disc spaces are better evaluated on the concomitant MRI. At the T12-L1 level, there is severe spinal canal stenosis, as previously demonstrated. There is gas within the disc space with a small amount of gas along the ventral aspect of the spinal canal. This may aid provided source for gas elsewhere in the spinal canal. The L2-L5 levels are decompressed posteriorly. There is a solid posterior fusion mass. There is anterior fusion at L1-L2 and L3-L5. Visualized sacrum: Normal. IMPRESSION: 1. 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 2. Mild lucency surrounding both L1 transpedicular screws, greater on the right. This suggest a mild degree of loosening. 3. Moderate spinal canal stenosis at the T1-T2 level secondary to large posterior osteophyte. 4. Nonspecific pneumorrhachis, which is often detected incidentally and has numerous possible etiologies. In this case, the foci of gas in the spinal canal may have been released from disc vacuum phenomenon at the T12-L1 disc space. There is no associated compression of the thecal sac or other acute finding. 5. Moderate to severe narrowing of the right neural foramina at the T2-T6 levels. Electronically Signed   By: Ulyses Jarred M.D.   On: 12/15/2016 22:52   Ct Lumbar Spine Wo Contrast  Result Date: 12/15/2016 CLINICAL DATA:  Lower extremity weakness. History of spinal surgery. EXAM: CT THORACIC AND LUMBAR SPINE WITHOUT CONTRAST TECHNIQUE: Multidetector CT imaging of the thoracic and lumbar spine was performed without contrast. Multiplanar CT image reconstructions were also generated. COMPARISON:  Lumbar spine MRI 12/15/2016 FINDINGS: CT THORACIC SPINE FINDINGS Alignment: Normal. Vertebrae: There is flowing anterior osteophyte formation at all thoracic levels,  worst at T2-T3. No acute fracture. There is posterior spinal fusion with intervertebral disc spaces extending from L1 and L5. There is mild lucency surrounding the L1 screws. No abnormal lucency at the other levels. Paraspinal and other soft tissues: Negative. Disc levels: At T1-T2, there is a large central posterior osteophyte that causes  moderate spinal canal stenosis and indents the ventral spinal cord. There is otherwise no spinal canal stenosis. The facet hypertrophy and endplate spurring cause moderate to severe foraminal stenosis at the right T2-T6 levels. There are a few loculated foci of gas in the dorsal spinal canal, which are nonspecific. CT LUMBAR SPINE FINDINGS Segmentation: Normal Alignment: Grade 1 L4-L5 anterolisthesis. Vertebrae: Large osteophytes at nearly all levels. No acute compression fracture. Paraspinal and other soft tissues: Negative. Disc levels: The disc spaces are better evaluated on the concomitant MRI. At the T12-L1 level, there is severe spinal canal stenosis, as previously demonstrated. There is gas within the disc space with a small amount of gas along the ventral aspect of the spinal canal. This may aid provided source for gas elsewhere in the spinal canal. The L2-L5 levels are decompressed posteriorly. There is a solid posterior fusion mass. There is anterior fusion at L1-L2 and L3-L5. Visualized sacrum: Normal. IMPRESSION: 1. 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 2. Mild lucency surrounding both L1 transpedicular screws, greater on the right. This suggest a mild degree of loosening. 3. Moderate spinal canal stenosis at the T1-T2 level secondary to large posterior osteophyte. 4. Nonspecific pneumorrhachis, which is often detected incidentally and has numerous possible etiologies. In this case, the foci of gas in the spinal canal may have been released from disc vacuum phenomenon at the T12-L1 disc space.  There is no associated compression of the thecal sac or other acute finding. 5. Moderate to severe narrowing of the right neural foramina at the T2-T6 levels. Electronically Signed   By: Ulyses Jarred M.D.   On: 12/15/2016 22:52   Mr Lumbar Spine Wo Contrast  Result Date: 12/15/2016 CLINICAL DATA:  Initial evaluation for acute on chronic back pain with left-sided foot drop, status post recent epidural steroid injection, now with increased pain and episode of legs giving out. EXAM: MRI LUMBAR SPINE WITHOUT CONTRAST TECHNIQUE: Multiplanar, multisequence MR imaging of the lumbar spine was performed. No intravenous contrast was administered. COMPARISON:  Comparison made with most recent MRI from 11/24/2016. FINDINGS: Segmentation: Normal segmentation. Lowest well-formed disc is labeled the L5-S1 level. Same numbering system is employed as on previous exams. Alignment: Chronic 5 mm anterolisthesis of L4 on L5, stable from previous. Mild levoscoliosis. Vertebral bodies otherwise normally aligned with preservation of the normal lumbar lordosis. Vertebrae: Vertebral body heights are maintained. No evidence for acute or interval fracture. Extensive postsurgical changes from previous posterior fusion at L1 through L5, with previous interbody fusion at L1-2, L3-4, and L4-5. Reactive endplate changes noted about the T12-L1 interspace with prominent bulky anterior osteophytic spurring. Signal intensity within the vertebral body bone marrow is within normal limits. No discrete or worrisome osseous lesions. Conus medullaris: Extends to the L2 level and appears normal. Paraspinal and other soft tissues: Postsurgical changes with fatty atrophy present within the posterior paraspinous musculature. Similar postoperative fluid collection at the laminectomy defect extending from L2 through L5. Scattered parapelvic cyst noted within the kidneys bilaterally. Visualized visceral structures otherwise unremarkable. Disc levels: T10-11:  Seen only on sagittal projection. Mild diffuse disc bulge. No canal or foraminal stenosis. T11-12: Seen only on sagittal projection. Mild disc bulge. No canal or foraminal stenosis. T12-L1: Diffuse degenerative disc bulge with disc desiccation. Reactive endplate changes. Broad posterior component flattens and largely effaces the ventral thecal sac. Superimposed moderate facet arthrosis with ligamentum flavum thickening. Small reactive effusions within the bilateral T12-L1 facets.  There is a superimposed synovial cyst at the anteromedial aspect of the left T12-L1 facet measuring 5 mm (series 5, image 4). This appears slightly increased in size relative to most recent exam (previously 4 mm). Additional small synovial cyst slightly inferiorly and just to the right of midline at the posterior thecal sac measures 4 mm (series 5, image 5). There is resultant severe canal stenosis with impingement on the distal spinal cord which is compressed and shifted to the right (series 5, image 4). Thecal sac measures 5 mm in AP diameter at its most narrow point (previously 7 mm on 11/24/2016). No associated cord signal changes. Moderate to advanced bilateral T12 foraminal narrowing due to disc bulge, endplate changes, and facet disease present as well. This is slightly worse on the left. L1-2: Postoperative changes from posterior and interbody fusion. Residual central disc extrusion with inferior migration (series 5, image 11). No canal or subarticular stenosis. Foramina appear patent. L2-3: Mild diffuse disc bulge with a marginal endplate osteophytes. Prior posterior decompression with fusion. No residual canal stenosis. Foramina appear patent. L3-4: Prior posterior and interbody fusion. No residual canal or foraminal stenosis. L4-5: Chronic 5 mm anterolisthesis. Prior posterior and interbody fusion. No residual canal stenosis. The L4 foramina are elongated but remain patent due to slippage, stable. L5-S1: Chronic degenerative  intervertebral disc space narrowing with disc desiccation and diffuse disc bulge. Superimposed marginal endplate osteophytes. Disc osteophyte indents the ventral thecal sac, stable from previous. Resultant mild lateral recess stenosis without frank impingement of the descending S1 nerve roots. Moderate facet arthrosis. Mild left L5 foraminal narrowing. IMPRESSION: 1. Multifactorial degenerative changes with resultant severe canal stenosis and cord compression at the T12-L1 level as detailed above, slightly worsened as compared to recent MRI from 11/24/2016. No cord signal changes. Neuro surgical consultation recommended. 2. Extensive postoperative changes from previous posterior and interbody fusion at L1 through L5 as above, otherwise stable in appearance, with no new stenosis identified. Results were called by telephone at the time of interpretation on 12/15/2016 at 8:35 pm to Dr. Davonna Belling , who verbally acknowledged these results. Electronically Signed   By: Jeannine Boga M.D.   On: 12/15/2016 20:38   Dg C-arm 1-60 Min  Result Date: 12/16/2016 CLINICAL DATA:  Laminectomy. EXAM: DG C-ARM 61-120 MIN; THORACOLUMBAR SPINE - 2 VIEW COMPARISON:  CT 12/15/2016. FINDINGS: Postsurgical changes noted of the thoracolumbar spine. Surgical retractors and sponge noted over the posterior back . Visualized hardware is intact. Anatomic alignment. IMPRESSION: Postsurgical changes thoracolumbar spine. Electronically Signed   By: Marcello Moores  Register   On: 12/16/2016 07:25    Assessment/Plan Principal Problem:   Dyspnea -Patient reports shortness of breath postoperatively and setting of postoperative requirement of supine positioning secondary to dural tear -No true hypoxia noted when placing patient on room air -Continue oxygen prn for subjective dyspnea -Chest x-ray without focal infiltrate or edema but did demonstrate bilateral atelectasis -No unilateral lower extremity edema and no chest pain and since  not hypoxic or tachycardic doubt PE -Increase incentive spirometry to every hour while awake -TCDB every 2 hours -According to neurosurgeon patient head of bed can be raised again tomorrow -She denies reflux symptoms or choking/coughing with eating postoperatively -She reports a cough that is nonproductive stating she cannot get the phlegm up -Continue PPI in the event patient has atypical reflux symptoms-in review of old charts 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 Dyspnea cardiac ischemic marker -EKG and cycle troponins -Continue telemetry -Echocardiogram -Continue beta blocker and statin -Patient had been off her Plavix for several days prior to steroid injection on Monday but then immediately resumed and took 2 doses but has not been on Plavix since admission    Hypothyroidism -Continue Synthroid    Hypertension -Continue beta blocker     High cholesterol -Continue Crestor    Paraparesis/S/P lumbar laminectomy -Per attending neurosurgical team      ELLIS,ALLISON L. ANP-BC [facility] Pager [phone]   If 7PM-7AM, please contact night-coverage [email or web address] Password [email or web address]  12/17/2016, 10:52 AM

## 2016-12-18 DIAGNOSIS — R0602 Shortness of breath: Secondary | ICD-10-CM

## 2016-12-18 NOTE — Progress Notes (Addendum)
Pt seen and examined.  No issues overnight. Sore this am, but otherwise doing well Currently undergoing work up for dyspnea by TH. Appreciate assistance.  EXAM: Temp:  [98.3 F (36.8 C)-99 F (37.2 C)] 98.3 F (36.8 C) (07/21 0414) Pulse Rate:  [63-68] 68 (07/21 0414) Resp:  [18] 18 (07/20 1300) BP: (112-154)/(44-58) 154/54 (07/21 0414) SpO2:  [95 %-100 %] 95 % (07/21 0721) Intake/Output      07/20 0701 - 07/21 0700 07/21 0701 - 07/22 0700   P.O. 480    I.V. (mL/kg) 32.8 (0.4)    IV Piggyback 150    Total Intake(mL/kg) 662.8 (8.9)    Urine (mL/kg/hr) 2501 (1.4)    Drains 125    Total Output 2626     Net -1963.2           Awake and alert Follows commands throughout BUE normal LLE weaker than RLE but still able to move  Incision c/d/i  Plan Doing well and making great progress Raise head of bed slowly throughout day today Okay to mobilize if tolerating well Consult PT/OT/CIR

## 2016-12-18 NOTE — Anesthesia Postprocedure Evaluation (Signed)
Anesthesia Post Note  Patient: Kim Mcgee  Procedure(s) Performed: Procedure(s) (LRB): DECOMPRESSIVE  LAMINECTOMY Thoracic twelve to lumbar one (N/A) Posterior Fusion Thoracic- ten to Lumbar- two (N/A)     Patient location during evaluation: PACU Anesthesia Type: General Level of consciousness: awake Pain management: pain level controlled Vital Signs Assessment: post-procedure vital signs reviewed and stable Respiratory status: spontaneous breathing Cardiovascular status: stable Anesthetic complications: no    Last Vitals:  Vitals:   12/17/16 2022 12/18/16 0414  BP: (!) 143/55 (!) 154/54  Pulse: 68 68  Resp:    Temp:  36.8 C    Last Pain:  Vitals:   12/18/16 0414  TempSrc: Axillary  PainSc:                  Shai Mckenzie

## 2016-12-18 NOTE — Progress Notes (Signed)
[facility] Consultation Progress Note  Patient: Kim Mcgee HTD:[id number]   PCP: Kennieth Rad, MD DOB: 13-Apr-1941   DOA: 12/15/2016   DOS: 12/18/2016   Date of Service: the patient was seen and examined on 12/18/2016 Primary service: Kary Kos, MD   Subjective: Component about some chest pain on the right side. No nausea no vomiting. No fever no chills. Breathing is somewhat better. No diarrhea no constipation. Continues to have dry cough.  Brief hospital course: Kim Mcgee is a 76 y.o. female with medical history significant for single vessel CAD with stent on Plavix, asthma, HLD, hypertension, hypothyroidism, and arthritis. Patient was admitted by the neurosurgery team on 7/18 after having progressive low back pain associated with lower extremity weakness and pain left greater than right. She was not experiencing any bowel or bladder incontinence. MRI revealed suspected hematoma causing severe spinal cord compression at the T12-L1 level and worse then MRI 3-4 weeks prior. Patient was subsequently urgently taken to the OR on 7/19 where she underwent decompressive laminectomy. She was found to have a dural tear and postoperatively she has remained on supine bedrest i.e. unable to raise head of bed. Since her surgery she has developed progressive dyspnea without hypoxia. Neurosurgery requests internal medicine evaluation regarding her shortness of breath.  Assessment and Plan: Dyspnea Bilateral atelectasis -Patient reports shortness of breath postoperatively and setting of postoperative requirement of supine positioning secondary to dural tear - No true hypoxia noted when placing patient on room air - Continue oxygen prn for subjective dyspnea - Chest x-ray without focal infiltrate or edema but did demonstrate bilateral atelectasis - No unilateral lower extremity edema and no chest pain and since not hypoxic or tachycardic doubt PE - Increase incentive spirometry to every hour while  awake - TCDB every 2 hours - She denies reflux symptoms or choking/coughing with eating postoperatively - She reports a cough that is nonproductive stating she cannot get the phlegm up - Continue PPI in the event patient has atypical reflux symptoms-in review of old charts 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  Coronary artery disease - No chest pain but pt reports that prior to her previous stent placement her EKG and enzymes were negative and she had atypical symptomatology -EKG and troponins negative -Echocardiogram unremarkabe -Continue beta blocker and statin -Patient had been off her Plavix for several days prior to steroid injection on Monday but then immediately resumed and took 2 doses but has not been on Plavix since admission    Hypothyroidism -Continue Synthroid    Hypertension -Continue beta blocker     High cholesterol -Continue Crestor    Paraparesis/S/P lumbar laminectomy -Per attending neurosurgical team  Bowel regimen: last BM 12/14/2016 Diet: regular diet DVT Prophylaxis: subcutaneous Heparin  Family Communication: family was present at bedside, at the time of interview. The pt provided permission to discuss medical plan with the family. Opportunity was given to ask question and all questions were answered satisfactorily.    Disposition: We will continue to follow the patient.   Recommendation on discharge: Continue incentive spirometry  Other Consultants: none Procedures: laminectomy Antibiotics: Anti-infectives    Start     Dose/Rate Route Frequency Ordered Stop   12/16/16 1000  vancomycin (VANCOCIN) IVPB 750 mg/150 ml premix     750 mg 150 mL/hr over 60 Minutes Intravenous Every 12 hours 12/16/16 0411     12/16/16 0401  cephALEXin (KEFLEX) capsule 500 mg  Status:  Discontinued    Comments:  DAY BEFORE/DAY OF/DAY  AFTER DENTAL APPOINTMENTS     500 mg Oral See admin instructions 12/16/16 0401 12/16/16 0410   12/16/16 0219  vancomycin  (VANCOCIN) powder  Status:  Discontinued       As needed 12/16/16 0220 12/16/16 0247   12/16/16 0000  gentamicin (GARAMYCIN) 420 mg in dextrose 5 % 100 mL IVPB     420 mg 110.5 mL/hr over 60 Minutes Intravenous  Once 12/16/16 0000 12/16/16 0023   12/15/16 2259  bacitracin 50,000 Units in sodium chloride irrigation 0.9 % 500 mL irrigation  Status:  Discontinued       As needed 12/15/16 2300 12/16/16 0247      Objective: Physical Exam: Vitals:   12/18/16 0721 12/18/16 1300 12/18/16 1755 12/18/16 1926  BP:  (!) 146/54  (!) 142/58  Pulse:  69  68  Resp:  18    Temp:  97.7 F (36.5 C)  98.5 F (36.9 C)  TempSrc:  Oral  Oral  SpO2: 95% 97% 96% 96%  Weight:      Height:        Intake/Output Summary (Last 24 hours) at 12/18/16 1953 Last data filed at 12/18/16 1700  Gross per 24 hour  Intake              360 ml  Output             4000 ml  Net            -3640 ml   Filed Weights   12/15/16 1429  Weight: 74.4 kg (164 lb)   General: Alert, Awake and Oriented to Time, Place and Person. Appear in mild distress, affect appropriate Eyes: PERRL, Conjunctiva normal ENT: Oral Mucosa clear moist. Neck: no JVD, no Abnormal Mass Or lumps Cardiovascular: S1 and S2 Present, no Murmur, Peripheral Pulses Present Respiratory: normal respiratory effort, Bilateral Air entry equal and Decreased, no use of accessory muscle, Clear to Auscultation, no Crackles, no wheezes Abdomen: Bowel Sound present, Soft and no tenderness, no hernia Skin: no redness, no Rash, no induration Extremities: no Pedal edema, no calf tenderness Neurologic: Grossly no focal neuro deficit. Bilaterally Equal motor strength  Data Reviewed: CBC:  Recent Labs Lab 12/15/16 1436 12/17/16 1102  WBC 11.5* 14.5*  NEUTROABS  --  10.6*  HGB 14.1 11.6*  HCT 43.6 35.6*  MCV 90.3 89.2  PLT 225 078   Basic Metabolic Panel:  Recent Labs Lab 12/15/16 1436 12/16/16 2149 12/17/16 1102  NA 141 140 141  K 4.0 4.5 4.4  CL  104 103 105  CO2 26 29 28   GLUCOSE 93 180* 134*  BUN 13 10 13   CREATININE 0.67 0.69 0.66  CALCIUM 9.5 9.3 9.1   Liver Function Tests: No results for input(s): AST, ALT, ALKPHOS, BILITOT, PROT, ALBUMIN in the last 168 hours. No results for input(s): LIPASE, AMYLASE in the last 168 hours. No results for input(s): AMMONIA in the last 168 hours. Cardiac Enzymes:  Recent Labs Lab 12/17/16 1102 12/17/16 1604 12/17/16 2156  TROPONINI <0.03 <0.03 <0.03   BNP (last 3 results)  Recent Labs  12/17/16 1102  BNP 119.1*   CBG: No results for input(s): GLUCAP in the last 168 hours. No results found for this or any previous visit (from the past 240 hour(s)).  Studies: No results found.   Scheduled Meds: . bupivacaine liposome  20 mL Infiltration Once  . dexamethasone  10 mg Intravenous Q6H  . docusate sodium  100 mg Oral BID  . gabapentin  300 mg Oral TID  . levothyroxine  112 mcg Oral QAC breakfast  . loratadine  10 mg Oral Daily  . LORazepam  1 mg Oral QHS  . metoprolol succinate  50 mg Oral BID  . mometasone-formoterol  2 puff Inhalation BID  . pantoprazole (PROTONIX) IV  40 mg Intravenous QHS  . polyethylene glycol  17 g Oral Daily  . rosuvastatin  5 mg Oral QHS  . sodium chloride flush  3 mL Intravenous Q12H  . tamsulosin  0.4 mg Oral Daily   Continuous Infusions: . sodium chloride 250 mL (12/16/16 0614)  . vancomycin Stopped (12/18/16 1015)   PRN Meds: acetaminophen **OR** acetaminophen, albuterol, alum & mag hydroxide-simeth, cyclobenzaprine, HYDROcodone-acetaminophen, HYDROmorphone (DILAUDID) injection, menthol-cetylpyridinium **OR** phenol, nitroGLYCERIN, ondansetron **OR** ondansetron (ZOFRAN) IV, senna-docusate, sodium chloride flush, traMADol  Time spent: 35 minutes  Author: Berle Mull, MD [facility] Pager: [phone] 12/18/2016 7:53 PM  If 7PM-7AM, please contact night-coverage at [email or web address], password [facility]

## 2016-12-19 LAB — CBC WITH DIFFERENTIAL/PLATELET
BASOS PCT: 0 %
Basophils Absolute: 0 10*3/uL (ref 0.0–0.1)
Eosinophils Absolute: 0 10*3/uL (ref 0.0–0.7)
Eosinophils Relative: 0 %
HEMATOCRIT: 36.4 % (ref 36.0–46.0)
Hemoglobin: 11.9 g/dL — ABNORMAL LOW (ref 12.0–15.0)
LYMPHS ABS: 1.4 10*3/uL (ref 0.7–4.0)
LYMPHS PCT: 11 %
MCH: 28.9 pg (ref 26.0–34.0)
MCHC: 32.7 g/dL (ref 30.0–36.0)
MCV: 88.3 fL (ref 78.0–100.0)
MONO ABS: 1.1 10*3/uL — AB (ref 0.1–1.0)
MONOS PCT: 9 %
NEUTROS ABS: 9.8 10*3/uL — AB (ref 1.7–7.7)
Neutrophils Relative %: 80 %
Platelets: 255 10*3/uL (ref 150–400)
RBC: 4.12 MIL/uL (ref 3.87–5.11)
RDW: 14.4 % (ref 11.5–15.5)
WBC: 12.4 10*3/uL — ABNORMAL HIGH (ref 4.0–10.5)

## 2016-12-19 LAB — COMPREHENSIVE METABOLIC PANEL
ALT: 60 U/L — ABNORMAL HIGH (ref 14–54)
ANION GAP: 7 (ref 5–15)
AST: 47 U/L — ABNORMAL HIGH (ref 15–41)
Albumin: 2.7 g/dL — ABNORMAL LOW (ref 3.5–5.0)
Alkaline Phosphatase: 43 U/L (ref 38–126)
BILIRUBIN TOTAL: 0.7 mg/dL (ref 0.3–1.2)
BUN: 20 mg/dL (ref 6–20)
CALCIUM: 8.7 mg/dL — AB (ref 8.9–10.3)
CO2: 29 mmol/L (ref 22–32)
Chloride: 102 mmol/L (ref 101–111)
Creatinine, Ser: 0.63 mg/dL (ref 0.44–1.00)
GFR calc Af Amer: >60 mL/min (ref >60)
Glucose, Bld: 171 mg/dL — ABNORMAL HIGH (ref 65–99)
POTASSIUM: 4.1 mmol/L (ref 3.5–5.1)
Sodium: 138 mmol/L (ref 135–145)
TOTAL PROTEIN: 5.3 g/dL — AB (ref 6.5–8.1)

## 2016-12-19 NOTE — Evaluation (Signed)
Occupational Therapy Evaluation Patient Details Name: Kim Mcgee MRN: [id number] DOB: 1940/12/26 Today's Date: 12/19/2016    History of Present Illness 76 y.o. female with medical history significant for CAD,  asthma, HLD, hypertension, hypothyroidism, L-S1 fusion and arthritis. Admitted with back pain and LLE weakness with spinal hematoma T12-L1 s/p evacuation 7/19 with dural tear. dyspnea post op with bil atelecstasis   Clinical Impression   PTA, pt was living with her husband and was independent. Currently, pt requires Max A for LB ADLs and Min A for functional mobility using RW. Pt very pleasant and motivated to participate in therapy and return to PLOF. Provided education and handout on back precautions; pt verbalized understanding. Pt would benefit from further acute OT to increase functional performance and safety with ADLs. Recommend dc CIR for further intensive therapy to optimize pt safety and independence with ADLs and functional mobility as well as decreased caregiver burden.     Follow Up Recommendations  CIR;Supervision/Assistance - 24 hour    Equipment Recommendations  Other (comment) (Defer to next venue)    Recommendations for Other Services PT consult;Rehab consult     Precautions / Restrictions Precautions Precautions: Back;Fall Precaution Booklet Issued: Yes (comment) Precaution Comments: Reviewed back precations Restrictions Weight Bearing Restrictions: No      Mobility Bed Mobility Overal bed mobility: Needs Assistance Bed Mobility: Rolling;Sidelying to Sit Rolling: Min assist Sidelying to sit: Min assist       General bed mobility comments: cues for sequence with assist to roll, rise from side and reciprocally scoot with pad to EOB  Transfers Overall transfer level: Needs assistance   Transfers: Sit to/from Stand Sit to Stand: Min assist         General transfer comment: cues for hand placement with assist to rise    Balance Overall  balance assessment: Needs assistance Sitting-balance support: Feet supported;No upper extremity supported Sitting balance-Leahy Scale: Fair     Standing balance support: During functional activity;Bilateral upper extremity supported Standing balance-Leahy Scale: Poor                             ADL either performed or assessed with clinical judgement   ADL Overall ADL's : Needs assistance/impaired Eating/Feeding: Set up;Sitting   Grooming: Set up;Sitting   Upper Body Bathing: Minimal assistance;Sitting   Lower Body Bathing: Maximal assistance;Sit to/from stand   Upper Body Dressing : Minimal assistance;Sitting   Lower Body Dressing: Maximal assistance;Sit to/from stand               Functional mobility during ADLs: Minimal assistance;Rolling walker General ADL Comments: Provided education on back precautions and log roll technique for bed mobility. Pt's first time OOB since surgery. Pt performed sit<>Stand with RW and Min A and took 4 sie steps to sit higher in bed.  Will need further education and OT for LB ADLs with AE     Vision         Perception     Praxis      Pertinent Vitals/Pain Pain Assessment: Faces Pain Score: 5  Faces Pain Scale: Hurts even more Pain Location: back Pain Descriptors / Indicators: Aching;Discomfort Pain Intervention(s): Monitored during session;Repositioned     Hand Dominance Right   Extremity/Trunk Assessment Upper Extremity Assessment Upper Extremity Assessment: Generalized weakness   Lower Extremity Assessment Lower Extremity Assessment: Defer to PT evaluation RLE Deficits / Details: hip flexion 2/5, knee extension 2+/5, knee flexion 2+/5 RLE Sensation: decreased  light touch LLE Deficits / Details: hip flexion 2/5, knee extension 2/5, knee flexion 2-/5 LLE Sensation: decreased light touch   Cervical / Trunk Assessment Cervical / Trunk Assessment: Other exceptions Cervical / Trunk Exceptions: spinal hematoma  T12-L1 s/p evacuation 7/19 with dural tear   Communication Communication Communication: No difficulties   Cognition Arousal/Alertness: Awake/alert Behavior During Therapy: WFL for tasks assessed/performed Overall Cognitive Status: Within Functional Limits for tasks assessed                                     General Comments       Exercises    Shoulder Instructions      Home Living Family/patient expects to be discharged to:: Private residence Living Arrangements: Spouse/significant other Available Help at Discharge: Family;Available 24 hours/day Type of Home: House Home Access: Stairs to enter [employer] of Steps: 4   Home Layout: One level     Bathroom Shower/Tub: Occupational psychologist: Standard     Home Equipment: Environmental consultant - 2 wheels;Cane - single point;Wheelchair - [employer];Shower seat;Adaptive equipment          Prior Functioning/Environment Level of Independence: Independent        Comments: Several previous back surgeries        OT Problem List: Decreased strength;Decreased range of motion;Decreased activity tolerance;Impaired balance (sitting and/or standing);Decreased safety awareness;Decreased knowledge of use of DME or AE;Decreased knowledge of precautions;Pain      OT Treatment/Interventions: Self-care/ADL training;Therapeutic exercise;Energy conservation;DME and/or AE instruction;Therapeutic activities;Patient/family education    OT Goals(Current goals can be found in the care plan section) Acute Rehab OT Goals Patient Stated Goal: walk, shop OT Goal Formulation: With patient Time For Goal Achievement: 01/02/17 Potential to Achieve Goals: Good ADL Goals Pt Will Perform Grooming: with min guard assist;standing Pt Will Perform Lower Body Dressing: with min guard assist;sit to/from stand;with adaptive equipment Pt Will Transfer to Toilet: with min guard assist;bedside commode;ambulating Pt Will  Perform Toileting - Clothing Manipulation and hygiene: with min guard assist;sit to/from stand  OT Frequency: Min 2X/week   Barriers to D/C:            Co-evaluation              AM-PAC PT "6 Clicks" Daily Activity     Outcome Measure Help from another person eating meals?: None Help from another person taking care of personal grooming?: A Little Help from another person toileting, which includes using toliet, bedpan, or urinal?: A Lot Help from another person bathing (including washing, rinsing, drying)?: A Lot Help from another person to put on and taking off regular upper body clothing?: A Little Help from another person to put on and taking off regular lower body clothing?: A Lot 6 Click Score: 16   End of Session Equipment Utilized During Treatment: Gait belt;Rolling walker Nurse Communication: Mobility status  Activity Tolerance: Patient tolerated treatment well;Patient limited by fatigue;Patient limited by pain Patient left: in bed;with call bell/phone within reach  OT Visit Diagnosis: Unsteadiness on feet (R26.81);Other abnormalities of gait and mobility (R26.89);Muscle weakness (generalized) (M62.81);History of falling (Z91.81);Pain Pain - Right/Left:  (Back) Pain - part of body:  (Back)                Time: 7846-9629 OT Time Calculation (min): 27 min Charges:  OT General Charges $OT Visit: 1 Procedure OT Evaluation $OT Eval Low Complexity: 1 Procedure  OT Treatments $Self Care/Home Management : 8-22 mins G-Codes:     Levorn Oleski MSOT, OTR/L Acute Rehab Pager: [phone] Office: [place] 12/19/2016, 12:52 PM

## 2016-12-19 NOTE — Evaluation (Signed)
Physical Therapy Evaluation Patient Details Name: Kim Mcgee MRN: [id number] DOB: February 16, 1941 Today's Date: 12/19/2016   History of Present Illness  76 y.o. female with medical history significant for CAD,  asthma, HLD, hypertension, hypothyroidism, L-S1 fusion and arthritis. Admitted with back pain and LLE weakness with spinal hematoma T12-L1 s/p evacuation 7/19 with dural tear. dyspnea post op with bil atelecstasis  Clinical Impression  Pt pleasant but very frustrated with lack of strength and control bil LE. Pt with decreased bil LE sensation, strength grossly 2/5 and unable to take significant steps. Pt without dizziness or HA with mobility. Pt with above deficits as well as those in PT problem list and will benefit from acute therapy to maximize mobility, function, strength, gait and balance to decrease burden of care.     Follow Up Recommendations CIR;Supervision/Assistance - 24 hour    Equipment Recommendations  None recommended by PT    Recommendations for Other Services OT consult;Rehab consult     Precautions / Restrictions Precautions Precautions: Back;Fall Restrictions Weight Bearing Restrictions: No      Mobility  Bed Mobility Overal bed mobility: Needs Assistance Bed Mobility: Rolling;Sidelying to Sit Rolling: Min assist Sidelying to sit: Min assist       General bed mobility comments: cues for sequence with assist to roll, rise from side and reciprocally scoot with pad to EOB  Transfers Overall transfer level: Needs assistance   Transfers: Sit to/from Stand Sit to Stand: Min assist         General transfer comment: cues for hand placement with assist to rise  Ambulation/Gait Ambulation/Gait assistance: Min assist Ambulation Distance (Feet): 2 Feet Assistive device: Rolling walker (2 wheeled) Gait Pattern/deviations: Shuffle   Gait velocity interpretation: Below normal speed for age/gender General Gait Details: initial assist for weight shifting  and advancing legs, after the first 3 steps pt abl e to advance feet without full foot clearance, sliding foot, assist for balance with RW and stepping to chair  Stairs            Wheelchair Mobility    Modified Rankin (Stroke Patients Only)       Balance Overall balance assessment: Needs assistance   Sitting balance-Leahy Scale: Fair       Standing balance-Leahy Scale: Poor                               Pertinent Vitals/Pain Pain Assessment: 0-10 Pain Score: 5  Pain Location: back Pain Descriptors / Indicators: Aching;Discomfort Pain Intervention(s): Monitored during session;Repositioned    Home Living Family/patient expects to be discharged to:: Private residence Living Arrangements: Spouse/significant other Available Help at Discharge: Family;Available 24 hours/day Type of Home: House Home Access: Stairs to enter   [employer] of Steps: 4 Home Layout: One level Home Equipment: Walker - 2 wheels;Cane - single point;Wheelchair - [employer];Shower seat;Adaptive equipment      Prior Function Level of Independence: Independent         Comments: [place] previous back surgeries     Hand Dominance        Extremity/Trunk Assessment   Upper Extremity Assessment Upper Extremity Assessment: Defer to OT evaluation    Lower Extremity Assessment Lower Extremity Assessment: RLE deficits/detail;LLE deficits/detail RLE Deficits / Details: hip flexion 2/5, knee extension 2+/5, knee flexion 2+/5 RLE Sensation: decreased light touch LLE Deficits / Details: hip flexion 2/5, knee extension 2/5, knee flexion 2-/5 LLE Sensation: decreased light touch  Cervical / Trunk Assessment Cervical / Trunk Assessment: Kyphotic  Communication   Communication: No difficulties  Cognition Arousal/Alertness: Awake/alert Behavior During Therapy: WFL for tasks assessed/performed Overall Cognitive Status: Within Functional Limits for tasks  assessed                                        General Comments      Exercises General Exercises - Lower Extremity Short Arc Quad: AROM;Both;5 reps;Seated   Assessment/Plan    PT Assessment Patient needs continued PT services  PT Problem List Decreased strength;Decreased mobility;Decreased activity tolerance;Decreased range of motion;Decreased balance;Decreased knowledge of use of DME;Pain;Decreased coordination       PT Treatment Interventions DME instruction;Gait training;Therapeutic exercise;Patient/family education;Stair training;Balance training;Functional mobility training;Neuromuscular re-education;Therapeutic activities    PT Goals (Current goals can be found in the Care Plan section)  Acute Rehab PT Goals Patient Stated Goal: walk, shop PT Goal Formulation: With patient Time For Goal Achievement: 01/02/17 Potential to Achieve Goals: Good    Frequency Min 5X/week   Barriers to discharge        Co-evaluation               AM-PAC PT "6 Clicks" Daily Activity  Outcome Measure Difficulty turning over in bed (including adjusting bedclothes, sheets and blankets)?: Total Difficulty moving from lying on back to sitting on the side of the bed? : Total Difficulty sitting down on and standing up from a chair with arms (e.g., wheelchair, bedside commode, etc,.)?: Total Help needed moving to and from a bed to chair (including a wheelchair)?: A Lot Help needed walking in hospital room?: A Lot Help needed climbing 3-5 steps with a railing? : Total 6 Click Score: 8    End of Session Equipment Utilized During Treatment: Gait belt Activity Tolerance: Patient tolerated treatment well Patient left: in chair;with call bell/phone within reach;with chair alarm set;with nursing/sitter in room Nurse Communication: Precautions;Mobility status (sitting <30 min) PT Visit Diagnosis: Other abnormalities of gait and mobility (R26.89);Difficulty in walking, not  elsewhere classified (R26.2);Muscle weakness (generalized) (M62.81)    Time: 5638-7564 PT Time Calculation (min) (ACUTE ONLY): 23 min   Charges:   PT Evaluation $PT Eval Moderate Complexity: 1 Procedure PT Treatments $Therapeutic Activity: 8-22 mins   PT G Codes:        Elwyn Reach, PT [phone]   [place] B Wenceslaus Gist 12/19/2016, 12:27 PM

## 2016-12-19 NOTE — Progress Notes (Signed)
Pharmacy Antibiotic Note  Kim Mcgee is a 76 y.o. female admitted on 12/15/2016 with severe spinal stenosis/large synovial cyst.  Pharmacy has been monitoring her Vancomycin dosing.  Currently has drain in place. Vancomycin to continue until Scott by MD per protocol comments. No new labs today - last WBC 14.5, currently afebrile and stable renal function with UOP of 1.64ml/kg/hr. Chest Xray revealed some atelectasis but nothing acute.  Plan: Continue Vancomycin 750 mg IV q12h F/U drain removal  Drug levels as indicated   Height: 5\' 2"  (157.5 cm) Weight: 164 lb (74.4 kg) IBW/kg (Calculated) : 50.1  Temp (24hrs), Avg:98.5 F (36.9 C), Min:98.5 F (36.9 C), Max:98.5 F (36.9 C)   Recent Labs Lab 12/15/16 1436 12/16/16 2149 12/17/16 1102  WBC 11.5*  --  14.5*  CREATININE 0.67 0.69 0.66    Estimated Creatinine Clearance: 56.5 mL/min (by C-G formula based on SCr of 0.66 mg/dL).    Allergies  Allergen Reactions  . Macrodantin [Nitrofurantoin] Other (See Comments)    Chest pain  . Morphine And Related Nausea Only    CAUSED THE DRY HEAVES; CANNOT TAKE AGAIN!!  . Penicillins Rash and Other (See Comments)    Chest pain, also Has patient had a PCN reaction causing immediate rash, facial/tongue/throat swelling, SOB or lightheadedness with hypotension: Yes Has patient had a PCN reaction causing severe rash involving mucus membranes or skin necrosis: No Has patient had a PCN reaction that required hospitalization: No Has patient had a PCN reaction occurring within the last 10 years: Yes If all of the above answers are "NO", then may proceed with Cephalosporin use.   . Tape Rash    No adhesive tape, but [place], PharmD., MS Clinical Pharmacist Pager:  [phone] Thank you for allowing pharmacy to be part of this patients care team. 12/19/2016 1:37 PM

## 2016-12-19 NOTE — Progress Notes (Signed)
Pt seen and examined.  No issues overnight. Head of bead elevated with initial dizziness, but resolved. No HA or dizziness since. No concerns today Would like to start ambulating  EXAM: Temp:  [97.7 F (36.5 C)-98.5 F (36.9 C)] 98.5 F (36.9 C) (07/22 0515) Pulse Rate:  [61-69] 61 (07/22 0515) Resp:  [18] 18 (07/21 1300) BP: (142-160)/(54-60) 160/60 (07/22 0515) SpO2:  [95 %-98 %] 95 % (07/22 0515) Intake/Output      07/21 0701 - 07/22 0700 07/22 0701 - 07/23 0700   P.O. 360    I.V. (mL/kg) 210 (2.8)    IV Piggyback 150    Total Intake(mL/kg) 720 (9.7)    Urine (mL/kg/hr) 3150 (1.8)    Total Output 3150     Net -2430           Awake and alert Follows commands throughout Full strength of BUE Moves BLE with LLL weaker than right Incision c/d/i, flat  Plan Doing well and making great progress Okay to mobilize. If HA or dizziness develop will need to rest. Nursing notified.

## 2016-12-19 NOTE — Progress Notes (Signed)
[facility] Consultation Progress Note  Patient: Kim Mcgee WRU:[id number]   PCP: Kennieth Rad, MD DOB: [birth year]   DOA: 12/15/2016   DOS: 12/19/2016   Date of Service: the patient was seen and examined on 12/19/2016 Primary service: Kary Kos, MD   Subjective: improvement in chest pain, shortnes of breath has resolved, on room air since last 24 hours  Brief hospital course: Kim Mcgee is a 76 y.o. female with medical history significant for single vessel CAD with stent on Plavix, asthma, HLD, hypertension, hypothyroidism, and arthritis. Patient was admitted by the neurosurgery team on 7/18 after having progressive low back pain associated with lower extremity weakness and pain left greater than right. She was not experiencing any bowel or bladder incontinence. MRI revealed suspected hematoma causing severe spinal cord compression at the T12-L1 level and worse then MRI 3-4 weeks prior. Patient was subsequently urgently taken to the OR on 7/19 where she underwent decompressive laminectomy. She was found to have a dural tear and postoperatively she has remained on supine bedrest i.e. unable to raise head of bed. Since her surgery she has developed progressive dyspnea without hypoxia. Neurosurgery requests internal medicine evaluation regarding her shortness of breath.  Assessment and Plan: Dyspnea Bilateral atelectasis -Patient reports shortness of breath postoperatively and setting of postoperative requirement of supine positioning secondary to dural tear - No true hypoxia noted when placing patient on room air - Continue oxygen prn for subjective dyspnea - Chest x-ray without focal infiltrate or edema but did demonstrate bilateral atelectasis - No unilateral lower extremity edema and no chest pain and since not hypoxic or tachycardic doubt PE - continue incentive spirometry to every hour while awake - TCDB every 2 hours - She denies reflux symptoms or choking/coughing with eating  postoperatively - She reports a cough that is nonproductive stating she cannot get the phlegm up - Continue PPI in the event patient has atypical reflux symptoms-in review of old charts 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  Coronary artery disease - No chest pain but pt reports that prior to her previous stent placement her EKG and enzymes were negative and she had atypical symptomatology -EKG and troponins negative -Echocardiogram unremarkabe -Continue beta blocker and statin -Patient had been off her Plavix for several days prior to steroid injection on Monday but then immediately resumed and took 2 doses but has not been on Plavix since admission    Hypothyroidism -Continue Synthroid    Hypertension -Continue beta blocker     High cholesterol -Continue Crestor    Paraparesis/S/P lumbar laminectomy -Per attending neurosurgical team  Bowel regimen: last BM 12/14/2016 Diet: regular diet DVT Prophylaxis: subcutaneous Heparin  Family Communication: no family was present at bedside, at the time of interview.  Disposition: We will sign off the patient now that she has been maintain on room air and symptoms has improved, mostly atlectesis .   Recommendation on discharge: Continue incentive spirometry  Other Consultants: none Procedures: laminectomy Antibiotics: Anti-infectives    Start     Dose/Rate Route Frequency Ordered Stop   12/16/16 1000  vancomycin (VANCOCIN) IVPB 750 mg/150 ml premix     750 mg 150 mL/hr over 60 Minutes Intravenous Every 12 hours 12/16/16 0411     12/16/16 0401  cephALEXin (KEFLEX) capsule 500 mg  Status:  Discontinued    Comments:  DAY BEFORE/DAY OF/DAY AFTER DENTAL APPOINTMENTS     500 mg Oral See admin instructions 12/16/16 0401 12/16/16 0410   12/16/16 0219  vancomycin (  VANCOCIN) powder  Status:  Discontinued       As needed 12/16/16 0220 12/16/16 0247   12/16/16 0000  gentamicin (GARAMYCIN) 420 mg in dextrose 5 % 100 mL IVPB     420  mg 110.5 mL/hr over 60 Minutes Intravenous  Once 12/16/16 0000 12/16/16 0023   12/15/16 2259  bacitracin 50,000 Units in sodium chloride irrigation 0.9 % 500 mL irrigation  Status:  Discontinued       As needed 12/15/16 2300 12/16/16 0247      Objective: Physical Exam: Vitals:   12/18/16 2044 12/19/16 0515 12/19/16 0901 12/19/16 1452  BP:  (!) 160/60  (!) 132/52  Pulse:  61  64  Resp:    16  Temp:  98.5 F (36.9 C)  98.4 F (36.9 C)  TempSrc:  Oral  Oral  SpO2: 98% 95% 94% 96%  Weight:      Height:        Intake/Output Summary (Last 24 hours) at 12/19/16 1716 Last data filed at 12/19/16 1600  Gross per 24 hour  Intake           879.33 ml  Output             1650 ml  Net          -770.67 ml   Filed Weights   12/15/16 1429  Weight: 74.4 kg (164 lb)   General: Alert, Awake and Oriented to Time, Place and Person. Appear in mild distress, affect appropriate Eyes: PERRL, Conjunctiva normal ENT: Oral Mucosa clear moist. Neck: no JVD, no Abnormal Mass Or lumps Cardiovascular: S1 and S2 Present, no Murmur, Peripheral Pulses Present Respiratory: normal respiratory effort, Bilateral Air entry equal and Decreased, no use of accessory muscle, Clear to Auscultation, no Crackles, no wheezes Abdomen: Bowel Sound present, Soft and no tenderness, no hernia Skin: no redness, no Rash, no induration Extremities: no Pedal edema, no calf tenderness Neurologic: Grossly no focal neuro deficit. Bilaterally Equal motor strength  Data Reviewed: CBC:  Recent Labs Lab 12/15/16 1436 12/17/16 1102 12/19/16 1415  WBC 11.5* 14.5* 12.4*  NEUTROABS  --  10.6* 9.8*  HGB 14.1 11.6* 11.9*  HCT 43.6 35.6* 36.4  MCV 90.3 89.2 88.3  PLT 225 209 737   Basic Metabolic Panel:  Recent Labs Lab 12/15/16 1436 12/16/16 2149 12/17/16 1102 12/19/16 1415  NA 141 140 141 138  K 4.0 4.5 4.4 4.1  CL 104 103 105 102  CO2 26 29 28 29   GLUCOSE 93 180* 134* 171*  BUN 13 10 13 20   CREATININE 0.67 0.69  0.66 0.63  CALCIUM 9.5 9.3 9.1 8.7*   Liver Function Tests:  Recent Labs Lab 12/19/16 1415  AST 47*  ALT 60*  ALKPHOS 43  BILITOT 0.7  PROT 5.3*  ALBUMIN 2.7*   No results for input(s): LIPASE, AMYLASE in the last 168 hours. No results for input(s): AMMONIA in the last 168 hours. Cardiac Enzymes:  Recent Labs Lab 12/17/16 1102 12/17/16 1604 12/17/16 2156  TROPONINI <0.03 <0.03 <0.03   BNP (last 3 results)  Recent Labs  12/17/16 1102  BNP 119.1*   CBG: No results for input(s): GLUCAP in the last 168 hours. No results found for this or any previous visit (from the past 240 hour(s)).  Studies: No results found.   Scheduled Meds: . bupivacaine liposome  20 mL Infiltration Once  . dexamethasone  10 mg Intravenous Q6H  . docusate sodium  100 mg Oral BID  .  gabapentin  300 mg Oral TID  . levothyroxine  112 mcg Oral QAC breakfast  . loratadine  10 mg Oral Daily  . LORazepam  1 mg Oral QHS  . metoprolol succinate  50 mg Oral BID  . mometasone-formoterol  2 puff Inhalation BID  . pantoprazole (PROTONIX) IV  40 mg Intravenous QHS  . polyethylene glycol  17 g Oral Daily  . rosuvastatin  5 mg Oral QHS  . sodium chloride flush  3 mL Intravenous Q12H  . tamsulosin  0.4 mg Oral Daily   Continuous Infusions: . sodium chloride 250 mL (12/16/16 0614)  . vancomycin Stopped (12/19/16 1147)   PRN Meds: acetaminophen **OR** acetaminophen, albuterol, alum & mag hydroxide-simeth, cyclobenzaprine, HYDROcodone-acetaminophen, HYDROmorphone (DILAUDID) injection, menthol-cetylpyridinium **OR** phenol, nitroGLYCERIN, ondansetron **OR** ondansetron (ZOFRAN) IV, senna-docusate, sodium chloride flush, traMADol  Time spent: 30 minutes  Author: Berle Mull, MD [facility] Pager: [phone] 12/19/2016 5:16 PM  If 7PM-7AM, please contact night-coverage at [email or web address], password [facility]

## 2016-12-20 ENCOUNTER — Encounter (HOSPITAL_COMMUNITY): Payer: Self-pay | Admitting: Physical Medicine and Rehabilitation

## 2016-12-20 DIAGNOSIS — M549 Dorsalgia, unspecified: Secondary | ICD-10-CM

## 2016-12-20 DIAGNOSIS — I10 Essential (primary) hypertension: Secondary | ICD-10-CM

## 2016-12-20 DIAGNOSIS — T380X5A Adverse effect of glucocorticoids and synthetic analogues, initial encounter: Secondary | ICD-10-CM

## 2016-12-20 DIAGNOSIS — G8220 Paraplegia, unspecified: Secondary | ICD-10-CM

## 2016-12-20 DIAGNOSIS — M792 Neuralgia and neuritis, unspecified: Secondary | ICD-10-CM

## 2016-12-20 DIAGNOSIS — G8929 Other chronic pain: Secondary | ICD-10-CM

## 2016-12-20 DIAGNOSIS — I25119 Atherosclerotic heart disease of native coronary artery with unspecified angina pectoris: Secondary | ICD-10-CM

## 2016-12-20 DIAGNOSIS — D72829 Elevated white blood cell count, unspecified: Secondary | ICD-10-CM

## 2016-12-20 DIAGNOSIS — R739 Hyperglycemia, unspecified: Secondary | ICD-10-CM

## 2016-12-20 DIAGNOSIS — J385 Laryngeal spasm: Secondary | ICD-10-CM

## 2016-12-20 DIAGNOSIS — Z9889 Other specified postprocedural states: Secondary | ICD-10-CM

## 2016-12-20 DIAGNOSIS — R410 Disorientation, unspecified: Secondary | ICD-10-CM

## 2016-12-20 DIAGNOSIS — G8918 Other acute postprocedural pain: Secondary | ICD-10-CM

## 2016-12-20 DIAGNOSIS — M5440 Lumbago with sciatica, unspecified side: Secondary | ICD-10-CM

## 2016-12-20 MED ORDER — LIDOCAINE 5 % EX PTCH
1.0000 | MEDICATED_PATCH | CUTANEOUS | Status: DC
  Administered 2016-12-20 – 2016-12-21 (×2): 1 via TRANSDERMAL
  Filled 2016-12-20 (×3): qty 1

## 2016-12-20 NOTE — Progress Notes (Signed)
Vitals:   12/19/16 0901 12/19/16 1452 12/19/16 2006 12/20/16 0540  BP:  (!) 132/52 132/62 (!) 152/45  Pulse:  64 76 64  Resp:  16 18 18   Temp:  98.4 F (36.9 C) 98.2 F (36.8 C) 98.4 F (36.9 C)  TempSrc:  Oral Oral Oral  SpO2: 94% 96% 96% 94%  Weight:      Height:        CBC  Recent Labs  12/17/16 1102 12/19/16 1415  WBC 14.5* 12.4*  HGB 11.6* 11.9*  HCT 35.6* 36.4  PLT 209 255   BMET  Recent Labs  12/17/16 1102 12/19/16 1415  NA 141 138  K 4.4 4.1  CL 105 102  CO2 28 29  GLUCOSE 134* 171*  BUN 13 20  CREATININE 0.66 0.63  CALCIUM 9.1 8.7*    Patient sitting up in bed, without complaints. Denies headache. Just finished eating breakfast. PT and OT initiated this weekend, both have recommended comprehensive inpatient rehabilitation (CIR). Order placed for physical medicine and rehabilitation consultation.  Exam today shows distal left lower extremity weaker than distal right lower extremity, as at admission. Patient with significant limitations in transfers and mobility due to paraparesis.  Plan: Continue PT and OT. PM and R consultation regarding CIR.  Hosie Spangle, MD 12/20/2016, 7:59 AM

## 2016-12-20 NOTE — Progress Notes (Signed)
Rehab admissions - I met with patient and her husband at the bedside.  They would like inpatient rehab prior to home.  Husband is retired and can assist.  I am hopeful for a rehab bed to be available tomorrow.  I will follow up in am.  Call me for questions.  #[phone]

## 2016-12-20 NOTE — Progress Notes (Signed)
  Speech Language Pathology Treatment: Dysphagia  Patient Details Name: Kim Mcgee MRN: [id number] DOB: 1940/07/08 Today's Date: 12/20/2016 Time: 8882-8003 SLP Time Calculation (min) (ACUTE ONLY): 21 min  Assessment / Plan / Recommendation Clinical Impression  Pt seen at bedside for assessment of diet tolerance and education. Pt now able to sit upright. She is fully aware of esophageal dysmotility and importance of adherence to strategies. No overt s/s aspiration observed or reported. CXR indicates minimal BLL atelectasis without superimposed acute cardiopulmonary disease. ST will continue to monitor acutely. Recommend pt keep appointment for repeat swallow evaluation next month.   HPI HPI: 76 year old female admitted 12/15/16 with LLE numbness due to severe spinal cord compression at T12-L1. Pt underwent surgery 12/15/16 and must remain flat - no trendelenburg. 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      SLP Plan   Continue with current plan of care    Recommendations  Diet recommendations: Regular;Thin liquid Liquids provided via: Cup;Straw Medication Administration: Whole meds with liquid Supervision: Intermittent supervision to cue for compensatory strategies;Patient able to self feed Compensations: Minimize environmental distractions;Slow rate;Small sips/bites Postural Changes and/or Swallow Maneuvers: Out of bed for meals;Seated upright 90 degrees;Upright 30-60 min after meal    Gram Siedlecki B. [place], [facility], [place]  Shonna Chock 12/20/2016, 10:46 AM

## 2016-12-20 NOTE — Progress Notes (Signed)
Physical Therapy Treatment Patient Details Name: Kim Mcgee MRN: [id number] DOB: 07-22-1940 Today's Date: 12/20/2016    History of Present Illness 76 y.o. female with medical history significant for CAD,  asthma, HLD, hypertension, hypothyroidism, L-S1 fusion and arthritis. Admitted with back pain and LLE weakness with spinal hematoma T12-L1 s/p evacuation 7/19 with dural tear. dyspnea post op with bil atelecstasis    PT Comments    Pt slowly progressing towards goals, increase in strength and function since last session. Pt still limited by fatigue. VC's required for bed mobility, transfers and gait training. Pt did well with exercise, shows significant weakness in LLE vs. RLE. Next Tx to increase gait distance and continue with exercise plan.     Follow Up Recommendations  CIR;Supervision/Assistance - 24 hour     Equipment Recommendations  None recommended by PT    Recommendations for Other Services OT consult;Rehab consult     Precautions / Restrictions Precautions Precautions: Back;Fall Precaution Booklet Issued: Yes (comment) Precaution Comments: Reviewed back precations Restrictions Weight Bearing Restrictions: No    Mobility  Bed Mobility Overal bed mobility: Needs Assistance Bed Mobility: Rolling;Sidelying to Sit Rolling: Min assist Sidelying to sit: Min assist       General bed mobility comments: VC's for sequence with assist to roll, rise from side and scoot to EOB  Transfers Overall transfer level: Needs assistance Equipment used: Rolling walker (2 wheeled) Transfers: Sit to/from Stand Sit to Stand: Mod assist         General transfer comment: VC's for hand placement, required boost into standing  Ambulation/Gait Ambulation/Gait assistance: Mod assist;+2 physical assistance Ambulation Distance (Feet): 8 Feet Assistive device: Rolling walker (2 wheeled) Gait Pattern/deviations: Step-to pattern;Trunk flexed;Ataxic;Narrow base of support Gait  velocity: decreased    General Gait Details: VC's for technique with AD, posture, pt presents with ataxic gait pattern    Stairs            Wheelchair Mobility    Modified Rankin (Stroke Patients Only)       Balance Overall balance assessment: Needs assistance Sitting-balance support: Feet supported;No upper extremity supported Sitting balance-Leahy Scale: Fair     Standing balance support: During functional activity;Bilateral upper extremity supported Standing balance-Leahy Scale: Poor                              Cognition Arousal/Alertness: Awake/alert Behavior During Therapy: WFL for tasks assessed/performed Overall Cognitive Status: Within Functional Limits for tasks assessed                                        Exercises General Exercises - Lower Extremity Ankle Circles/Pumps: AROM;20 reps;Seated;Both Long Arc Quad: AROM;Both;Seated;10 reps (RLE signifigantly stronger than LLE) Hip Flexion/Marching: AROM;Both;10 reps;Seated (RLE signifigantly stronger than LLE)    General Comments        Pertinent Vitals/Pain Pain Assessment: Faces Faces Pain Scale: Hurts a little bit Pain Location: back, Pt complains of "funny feeling" in feet - heavy, unsteady, numb in some areas Pain Descriptors / Indicators: Discomfort;Aching Pain Intervention(s): Limited activity within patient's tolerance;Monitored during session;Repositioned    Home Living                      Prior Function            PT Goals (current goals can now be  found in the care plan section) Acute Rehab PT Goals Patient Stated Goal: to D/C to a SNF  PT Goal Formulation: With patient Potential to Achieve Goals: Good Progress towards PT goals: Progressing toward goals    Frequency    Min 5X/week      PT Plan Current plan remains appropriate    Co-evaluation              AM-PAC PT "6 Clicks" Daily Activity  Outcome Measure  Difficulty  turning over in bed (including adjusting bedclothes, sheets and blankets)?: A Lot Difficulty moving from lying on back to sitting on the side of the bed? : Total Difficulty sitting down on and standing up from a chair with arms (e.g., wheelchair, bedside commode, etc,.)?: Total Help needed moving to and from a bed to chair (including a wheelchair)?: A Lot Help needed walking in hospital room?: A Lot Help needed climbing 3-5 steps with a railing? : Total 6 Click Score: 9    End of Session Equipment Utilized During Treatment: Gait belt Activity Tolerance: Patient limited by fatigue Patient left: in chair;with call bell/phone within reach;with family/visitor present;with chair alarm set Nurse Communication: Mobility status PT Visit Diagnosis: Other abnormalities of gait and mobility (R26.89);Difficulty in walking, not elsewhere classified (R26.2);Muscle weakness (generalized) (M62.81)     Time: 0962-8366 PT Time Calculation (min) (ACUTE ONLY): 25 min  Charges:  $Gait Training: 8-22 mins $Therapeutic Exercise: 8-22 mins                    G Codes:       Airon Sahni, SPTA [id number]    Londyn Hotard 12/20/2016, 4:41 PM

## 2016-12-20 NOTE — Consult Note (Signed)
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  Referring Physician: Dr. Saintclair Halsted   HPI: Kim Mcgee is a 76 y.o. female with history of CAD, SOB, HTN, laryngospasm's, progressive back pain radiating to BLE and LLE weakness for 3-4 weeks with left foot drop who was treated with [facility] 12/13/16. She was admitted on 7/18 with increase in pain and fall due to BLE giving away. MRI spine done revealing multifactorial degenerative changes with severe canal stenosis T 12-L1  and cord compression--slightly worse since 6/27 MRI. She was taken to OR on 7/19 for decompressive laminectomy T12-L1 with removal of large synovial cyst and decompression of T12 and L1 nerve roots with removal of loose L1 screws and arthrodesis as well as repair of inadvertent dural tear by Dr. Saintclair Halsted. Post op on bedrest till 7/22. She has had issues with confusion and progressive dyspnea without hypoxia--IS recommended.  Therapy evaluations done and CIR recommended for follow up therapy. Was independent without AD till about 4-5 weeks ago. Was getting week and using walker 2 weeks PTA. She was able to sit up for 60 minutes yesterday and has been getting up to [facility] with assistance.   Review of Systems  HENT: Negative for hearing loss and tinnitus.   Eyes: Negative for blurred vision and double vision.  Respiratory: Negative for cough and hemoptysis.   Cardiovascular: Negative for chest pain.  Gastrointestinal: Positive for heartburn. Negative for constipation and vomiting.  Genitourinary: Negative for dysuria and urgency.  Musculoskeletal: Positive for joint pain (right hip pain) and myalgias.  Skin: Negative for itching and rash.  Neurological: Positive for dizziness (with mobility), sensory change (feels like she's standing on concrete balls.  LLE numbness. ), focal weakness and headaches (on and off).  Psychiatric/Behavioral: Negative  for memory loss. The patient is nervous/anxious. The patient does not have insomnia.   All other systems reviewed and are negative.    Past Medical History:  Diagnosis Date  . Anxiety   . Arthritis    lumbar DDD  . Asthma   . Basal cell carcinoma of nose   . Chronic lower back pain   . Coronary artery disease   . Dysrhythmia   . GERD (gastroesophageal reflux disease)   . High cholesterol   . Hypertension   . Hypothyroidism   . Normal cardiac stress test   . PONV (postoperative nausea and vomiting)   . Shortness of breath    /w walking long distance   . Swallowing difficulty    being seen by ENT- had endoscopy- told that its probably related to past neck surgeries     Past Surgical History:  Procedure Laterality Date  . ANTERIOR CERVICAL DECOMP/DISCECTOMY FUSION  2010  . APPENDECTOMY    . BACK SURGERY    . BASAL CELL CARCINOMA EXCISION     "cut off nose"  . CARDIAC CATHETERIZATION  1970s; 1990s?; 2017  . CATARACT EXTRACTION W/ INTRAOCULAR LENS  IMPLANT, BILATERAL Bilateral   . CORONARY ANGIOPLASTY WITH STENT PLACEMENT  ~ 2017  . DECOMPRESSIVE LUMBAR LAMINECTOMY LEVEL 1 N/A 12/15/2016   Procedure: DECOMPRESSIVE  LAMINECTOMY Thoracic twelve to lumbar one;  Surgeon: Kary Kos, MD;  Location: [place];  Service: Neurosurgery;  Laterality: N/A;  . DILATION AND CURETTAGE OF UTERUS    . OOPHORECTOMY     "?side"  . POSTERIOR FUSION LUMBAR SPINE  ~ 2013; 2016  . POSTERIOR LUMBAR FUSION  4 LEVEL N/A 12/15/2016   Procedure: Posterior Fusion Thoracic- ten to Lumbar- two;  Surgeon: Kary Kos, MD;  Location: [place];  Service: Neurosurgery;  Laterality: N/A;  . TONSILLECTOMY    . TOTAL THYROIDECTOMY  1990's  . TUBAL LIGATION      Family History  Problem Relation Age of Onset  . Cancer - Colon Mother   . Heart disease Father   . Bone cancer Brother   . Heart disease Brother     Social History:  Married. Independent prior to 4-5 weeks ago. She  reports that she has never smoked. She  has never used smokeless tobacco. She reports that she does not drink alcohol or use drugs.    Allergies  Allergen Reactions  . Macrodantin [Nitrofurantoin] Other (See Comments)    Chest pain  . Morphine And Related Nausea Only    CAUSED THE DRY HEAVES; CANNOT TAKE AGAIN!!  . Penicillins Rash and Other (See Comments)    Chest pain, also Has patient had a PCN reaction causing immediate rash, facial/tongue/throat swelling, SOB or lightheadedness with hypotension: Yes Has patient had a PCN reaction causing severe rash involving mucus membranes or skin necrosis: No Has patient had a PCN reaction that required hospitalization: No Has patient had a PCN reaction occurring within the last 10 years: Yes If all of the above answers are "NO", then may proceed with Cephalosporin use.   . Tape Rash    No adhesive tape, but COBAN WRAP IS TOLERATED    Medications Prior to Admission  Medication Sig Dispense Refill  . acetaminophen (TYLENOL) 325 MG tablet Take 325-650 mg by mouth every 6 (six) hours as needed (for pain or headaches).     Marland Kitchen albuterol (PROVENTIL HFA;VENTOLIN HFA) 108 (90 BASE) MCG/ACT inhaler Inhale 2 puffs into the lungs every 6 (six) hours as needed for shortness of breath.     . cephALEXin (KEFLEX) 500 MG capsule Take 500 mg by mouth See admin instructions. DAY BEFORE/DAY OF/DAY AFTER DENTAL APPOINTMENTS  50  . clopidogrel (PLAVIX) 75 MG tablet Take 75 mg by mouth daily.  3  . cyclobenzaprine (FLEXERIL) 10 MG tablet Take 1 tablet (10 mg total) by mouth 3 (three) times daily as needed for muscle spasms. For pain (Patient taking differently: Take 10 mg by mouth 3 (three) times daily as needed (for muscle spasms or pain). ) 50 tablet 3  . fexofenadine (ALLEGRA) 180 MG tablet Take 180 mg by mouth daily as needed for allergies.    . Fluticasone-Salmeterol (ADVAIR) 500-50 MCG/DOSE AEPB Inhale 1 puff into the lungs 2 (two) times daily as needed (for seasonal "flares").     . gabapentin  (NEURONTIN) 300 MG capsule Take 300 mg by mouth 3 (three) times daily.  0  . HYDROcodone-acetaminophen (NORCO/VICODIN) 5-325 MG tablet Take 1 tablet by mouth every 4 (four) hours as needed. (Patient taking differently: Take 1 tablet by mouth every 4 (four) hours as needed (for pain). ) 20 tablet 0  . levothyroxine (SYNTHROID, LEVOTHROID) 112 MCG tablet Take 112 mcg by mouth daily before breakfast.    . methocarbamol (ROBAXIN) 500 MG tablet Take 1 tablet (500 mg total) by mouth 3 (three) times daily between meals as needed. (Patient taking differently: Take 500 mg by mouth 3 (three) times daily as needed for muscle spasms. ) 20 tablet 0  . metoprolol succinate (TOPROL-XL) 50 MG 24 hr tablet Take 50 mg by mouth 2 (two) times daily.  2  . nitroGLYCERIN (NITROSTAT) 0.4  MG SL tablet Place 0.4 mg under the tongue every 5 (five) minutes x 3 doses as needed for chest pain.    . Omega-3 Fatty Acids (FISH OIL PO) Take 1 capsule by mouth daily.    . ondansetron (ZOFRAN ODT) 4 MG disintegrating tablet Take 1 tablet (4 mg total) by mouth every 8 (eight) hours as needed for nausea. 6 tablet 0  . oxazepam (SERAX) 15 MG capsule Take 15 mg by mouth at bedtime as needed for anxiety.   0  . pantoprazole (PROTONIX) 40 MG tablet Take 40 mg by mouth daily before breakfast.  3  . polyethylene glycol (MIRALAX / GLYCOLAX) packet Take 17 g by mouth daily. 14 each   . rosuvastatin (CRESTOR) 5 MG tablet Take 5 mg by mouth at bedtime.   1  . Tamsulosin HCl (FLOMAX) 0.4 MG CAPS Take 1 capsule (0.4 mg total) by mouth daily. 30 capsule   . traMADol (ULTRAM) 50 MG tablet Take 50 mg by mouth every 6 (six) hours as needed for pain.  0  . bethanechol (URECHOLINE) 25 MG tablet Take 1 tablet (25 mg total) by mouth 3 (three) times daily. (Patient not taking: Reported on 12/15/2016)    . dexamethasone (DECADRON) 4 MG tablet Take 1 tablet (4 mg total) by mouth 2 (two) times daily. (Patient not taking: Reported on 12/15/2016) 10 tablet 0  .  oxyCODONE-acetaminophen (PERCOCET/ROXICET) 5-325 MG per tablet Take 1-2 tablets by mouth every 4 (four) hours as needed. (Patient not taking: Reported on 12/15/2016) 61 tablet 0  . oxyCODONE-acetaminophen (PERCOCET/ROXICET) 5-325 MG per tablet Take 1-2 tablets by mouth every 4 (four) hours as needed for moderate pain. (Patient not taking: Reported on 12/15/2016) 60 tablet 0  . oxyCODONE-acetaminophen (PERCOCET/ROXICET) 5-325 MG per tablet Take 1-2 tablets by mouth every 6 (six) hours as needed for moderate pain. (Patient not taking: Reported on 12/15/2016) 80 tablet 0  . pregabalin (LYRICA) 75 MG capsule Take 1 capsule (75 mg total) by mouth 2 (two) times daily. (Patient not taking: Reported on 12/15/2016)      Home: Home Living Family/patient expects to be discharged to:: Private residence Living Arrangements: Spouse/significant other Available Help at Discharge: Family, Available 24 hours/day Type of Home: House Home Access: Stairs to enter Technical brewer of Steps: [place]: One level Bathroom Shower/Tub: Multimedia programmer: [place]: Environmental consultant - 2 wheels, [employer] - single point, Wheelchair - manual, Bedside commode, Shower seat, Adaptive equipment  Functional History: Prior Function Level of Independence: Independent Comments: Several previous back surgeries Functional Status:  Mobility: Bed Mobility Overal bed mobility: Needs Assistance Bed Mobility: Rolling, Sidelying to Sit Rolling: Min assist Sidelying to sit: Min assist General bed mobility comments: cues for sequence with assist to roll, rise from side and reciprocally scoot with pad to EOB Transfers Overall transfer level: Needs assistance Transfers: Sit to/from Stand Sit to Stand: Min assist General transfer comment: cues for hand placement with assist to rise Ambulation/Gait Ambulation/Gait assistance: Min assist Ambulation Distance (Feet): 2 Feet Assistive device: Rolling walker (2  wheeled) Gait Pattern/deviations: Shuffle General Gait Details: initial assist for weight shifting and advancing legs, after the first 3 steps pt abl e to advance feet without full foot clearance, sliding foot, assist for balance with RW and stepping to chair Gait velocity interpretation: Below normal speed for age/gender    ADL: ADL Overall ADL's : Needs assistance/impaired Eating/Feeding: Set up, Sitting Grooming: Set up, Sitting Upper Body Bathing: Minimal assistance, Sitting Lower  Body Bathing: Maximal assistance, Sit to/from stand Upper Body Dressing : Minimal assistance, Sitting Lower Body Dressing: Maximal assistance, Sit to/from stand Functional mobility during ADLs: Minimal assistance, Rolling walker General ADL Comments: Provided education on back precautions and log roll technique for bed mobility. Pt's first time OOB since surgery. Pt performed sit<>Stand with RW and Min A and took 4 sie steps to sit higher in bed.  Will need further education and OT for LB ADLs with AE  Cognition: Cognition Overall Cognitive Status: Within Functional Limits for tasks assessed Orientation Level: Oriented X4 Cognition Arousal/Alertness: Awake/alert Behavior During Therapy: WFL for tasks assessed/performed Overall Cognitive Status: Within Functional Limits for tasks assessed  Blood pressure (!) 135/58, pulse 70, temperature 98.4 F (36.9 C), temperature source Oral, resp. rate 18, height 5\' 2"  (1.575 m), weight 74.4 kg (164 lb), SpO2 94 %. Physical Exam  Nursing note and vitals reviewed. Constitutional: She is oriented to person, place, and time. She appears well-developed and well-nourished.  HENT:  Head: Normocephalic.  Mouth/Throat: Oropharynx is clear and moist.  Facial abrasions  Eyes: Pupils are equal, round, and reactive to light. Conjunctivae and EOM are normal.  Neck: Normal range of motion. Neck supple.  Cardiovascular: Normal rate and regular rhythm.   Respiratory: Effort  normal and breath sounds normal. No stridor.  GI: Soft. Bowel sounds are normal. She exhibits no distension. There is no tenderness.  Musculoskeletal: She exhibits no edema or tenderness.  Neurological: She is alert and oriented to person, place, and time. She displays abnormal reflex. Coordination abnormal.  Speech clear.  Follows basic commands without difficulty.  Motor: B/l UE 5/5 proximal to distal RLE: HF, KE 4-/5, ADF/PF 4/5 LLE: HF, KE 2/5, ADF/PF 3-/5 Sensation diminished to light touch LLE>RLE DTRs symmetric  Skin: Skin is warm and dry. No erythema.  Psychiatric: Her behavior is normal. Judgment and thought content normal. Her mood appears anxious.    Results for orders placed or performed during the hospital encounter of 12/15/16 (from the past 24 hour(s))  CBC with Differential/Platelet     Status: Abnormal   Collection Time: 12/19/16  2:15 PM  Result Value Ref Range   WBC 12.4 (H) 4.0 - 10.5 K/uL   RBC 4.12 3.87 - 5.11 MIL/uL   Hemoglobin 11.9 (L) 12.0 - 15.0 g/dL   HCT 36.4 36.0 - 46.0 %   MCV 88.3 78.0 - 100.0 fL   MCH 28.9 26.0 - 34.0 pg   MCHC 32.7 30.0 - 36.0 g/dL   RDW 14.4 11.5 - 15.5 %   Platelets 255 150 - 400 K/uL   Neutrophils Relative % 80 %   Neutro Abs 9.8 (H) 1.7 - 7.7 K/uL   Lymphocytes Relative 11 %   Lymphs Abs 1.4 0.7 - 4.0 K/uL   Monocytes Relative 9 %   Monocytes Absolute 1.1 (H) 0.1 - 1.0 K/uL   Eosinophils Relative 0 %   Eosinophils Absolute 0.0 0.0 - 0.7 K/uL   Basophils Relative 0 %   Basophils Absolute 0.0 0.0 - 0.1 K/uL  Comprehensive metabolic panel     Status: Abnormal   Collection Time: 12/19/16  2:15 PM  Result Value Ref Range   Sodium 138 135 - 145 mmol/L   Potassium 4.1 3.5 - 5.1 mmol/L   Chloride 102 101 - 111 mmol/L   CO2 29 22 - 32 mmol/L   Glucose, Bld 171 (H) 65 - 99 mg/dL   BUN 20 6 - 20 mg/dL   Creatinine, Ser  0.63 0.44 - 1.00 mg/dL   Calcium 8.7 (L) 8.9 - 10.3 mg/dL   Total Protein 5.3 (L) 6.5 - 8.1 g/dL   Albumin  2.7 (L) 3.5 - 5.0 g/dL   AST 47 (H) 15 - 41 U/L   ALT 60 (H) 14 - 54 U/L   Alkaline Phosphatase 43 38 - 126 U/L   Total Bilirubin 0.7 0.3 - 1.2 mg/dL   GFR calc non Af Amer >60 >60 mL/min   GFR calc Af Amer >60 >60 mL/min   Anion gap 7 5 - 15   No results found.  Assessment/Plan: Diagnosis: Lumbar myelopathy Labs independently reviewed.  Records reviewed and summated above.  1. Does the need for close, 24 hr/day medical supervision in concert with the patient's rehab needs make it unreasonable for this patient to be served in a less intensive setting? Yes  2. Co-Morbidities requiring supervision/potential complications: confusion and progressive dyspnea, CAD, SOB, HTN (monitor and provide prns in accordance with increased physical exertion and pain), laryngospasm's, neuropathic pain (cont meds), steroid induced hyperglycemia (Monitor in accordance with exercise and adjust meds as necessary), post-op pain (Biofeedback training with therapies to help reduce reliance on opiate pain medications, monitor pain control during therapies, and sedation at rest and titrate to maximum efficacy to ensure participation and gains in therapies), leukocytosis (cont to monitor for signs and symptoms of infection, further workup if indicated), wean IV Vanc when appropriate 3. Due to bladder management, bowel management, safety, skin/wound care, disease management, pain management and patient education, does the patient require 24 hr/day rehab nursing? Yes 4. Does the patient require coordinated care of a physician, rehab nurse, PT (1-2 hrs/day, 5 days/week) and OT (1-2 hrs/day, 5 days/week) to address physical and functional deficits in the context of the above medical diagnosis(es)? Yes Addressing deficits in the following areas: balance, endurance, locomotion, strength, transferring, bathing, dressing, toileting and psychosocial support 5. Can the patient actively participate in an intensive therapy program of at  least 3 hrs of therapy per day at least 5 days per week? In the near future 6. The potential for patient to make measurable gains while on inpatient rehab is excellent 7. Anticipated functional outcomes upon discharge from inpatient rehab are supervision  with PT, modified independent and supervision with OT, n/a with SLP. 8. Estimated rehab length of stay to reach the above functional goals is: 10-13 days. 9. Anticipated D/C setting: Home 10. Anticipated post D/C treatments: HH therapy and Home excercise program 11. Overall Rehab/Functional Prognosis: good  RECOMMENDATIONS: This patient's condition is appropriate for continued rehabilitative care in the following setting: CIR, likely tomorrow as activity tolerance improves, pending bed availability. Patient has agreed to participate in recommended program. Yes Note that insurance prior authorization may be required for reimbursement for recommended care.  Comment: Rehab Admissions Coordinator to follow up.  Delice Lesch, MD, [address] [place] 12/20/2016

## 2016-12-20 NOTE — Progress Notes (Signed)
Subjective: Patient reports moderate back pain. Still has some weakness in her left leg.   Objective: Vital signs in last 24 hours: Temp:  [98.2 F (36.8 C)-98.4 F (36.9 C)] 98.4 F (36.9 C) (07/23 0540) Pulse Rate:  [64-76] 70 (07/23 0859) Resp:  [16-18] 18 (07/23 0540) BP: (132-152)/(45-62) 135/58 (07/23 0859) SpO2:  [94 %-97 %] 94 % (07/23 0859)  Intake/Output from previous day: 07/22 0701 - 07/23 0700 In: 519.3 [P.O.:360; I.V.:9.3; IV Piggyback:150] Out: 503 [Urine:502; Stool:1] Intake/Output this shift: Total I/O In: 3 [I.V.:3] Out: -   Neurologic: Grossly normal  Lab Results: Lab Results  Component Value Date   WBC 12.4 (H) 12/19/2016   HGB 11.9 (L) 12/19/2016   HCT 36.4 12/19/2016   MCV 88.3 12/19/2016   PLT 255 12/19/2016   Lab Results  Component Value Date   INR 0.90 12/15/2016   BMET Lab Results  Component Value Date   NA 138 12/19/2016   K 4.1 12/19/2016   CL 102 12/19/2016   CO2 29 12/19/2016   GLUCOSE 171 (H) 12/19/2016   BUN 20 12/19/2016   CREATININE 0.63 12/19/2016   CALCIUM 8.7 (L) 12/19/2016    Studies/Results: No results found.  Assessment/Plan: PT/OT initiated. CIR recommended. Patient awaiting bed assignment.    LOS: 5 days    Ocie Cornfield [facility] 12/20/2016, 2:32 PM

## 2016-12-21 ENCOUNTER — Encounter (HOSPITAL_COMMUNITY): Payer: Self-pay

## 2016-12-21 ENCOUNTER — Inpatient Hospital Stay (HOSPITAL_COMMUNITY)
Admission: RE | Admit: 2016-12-21 | Discharge: 2017-01-04 | DRG: 092 | Disposition: A | Discharge status: Home / Self Care | Payer: Medicare Other | Source: Intra-hospital | Attending: Physical Medicine & Rehabilitation | Admitting: Physical Medicine & Rehabilitation

## 2016-12-21 ENCOUNTER — Inpatient Hospital Stay (HOSPITAL_COMMUNITY): Payer: Medicare Other

## 2016-12-21 DIAGNOSIS — G9520 Unspecified cord compression: Secondary | ICD-10-CM

## 2016-12-21 DIAGNOSIS — Z90721 Acquired absence of ovaries, unilateral: Secondary | ICD-10-CM

## 2016-12-21 DIAGNOSIS — R0602 Shortness of breath: Secondary | ICD-10-CM

## 2016-12-21 DIAGNOSIS — Z91048 Other nonmedicinal substance allergy status: Secondary | ICD-10-CM

## 2016-12-21 DIAGNOSIS — R1310 Dysphagia, unspecified: Secondary | ICD-10-CM | POA: Diagnosis present

## 2016-12-21 DIAGNOSIS — R0989 Other specified symptoms and signs involving the circulatory and respiratory systems: Secondary | ICD-10-CM | POA: Diagnosis not present

## 2016-12-21 DIAGNOSIS — I10 Essential (primary) hypertension: Secondary | ICD-10-CM | POA: Diagnosis present

## 2016-12-21 DIAGNOSIS — I2510 Atherosclerotic heart disease of native coronary artery without angina pectoris: Secondary | ICD-10-CM | POA: Diagnosis present

## 2016-12-21 DIAGNOSIS — Z955 Presence of coronary angioplasty implant and graft: Secondary | ICD-10-CM | POA: Diagnosis not present

## 2016-12-21 DIAGNOSIS — R739 Hyperglycemia, unspecified: Secondary | ICD-10-CM | POA: Diagnosis present

## 2016-12-21 DIAGNOSIS — R609 Edema, unspecified: Secondary | ICD-10-CM | POA: Diagnosis not present

## 2016-12-21 DIAGNOSIS — G959 Disease of spinal cord, unspecified: Secondary | ICD-10-CM | POA: Diagnosis present

## 2016-12-21 DIAGNOSIS — R2689 Other abnormalities of gait and mobility: Secondary | ICD-10-CM | POA: Diagnosis present

## 2016-12-21 DIAGNOSIS — N319 Neuromuscular dysfunction of bladder, unspecified: Secondary | ICD-10-CM | POA: Diagnosis not present

## 2016-12-21 DIAGNOSIS — K592 Neurogenic bowel, not elsewhere classified: Secondary | ICD-10-CM | POA: Diagnosis present

## 2016-12-21 DIAGNOSIS — D72829 Elevated white blood cell count, unspecified: Secondary | ICD-10-CM | POA: Diagnosis present

## 2016-12-21 DIAGNOSIS — G8220 Paraplegia, unspecified: Secondary | ICD-10-CM | POA: Diagnosis present

## 2016-12-21 DIAGNOSIS — F419 Anxiety disorder, unspecified: Secondary | ICD-10-CM | POA: Diagnosis present

## 2016-12-21 DIAGNOSIS — E7800 Pure hypercholesterolemia, unspecified: Secondary | ICD-10-CM | POA: Diagnosis present

## 2016-12-21 DIAGNOSIS — M4804 Spinal stenosis, thoracic region: Secondary | ICD-10-CM

## 2016-12-21 DIAGNOSIS — Z79899 Other long term (current) drug therapy: Secondary | ICD-10-CM

## 2016-12-21 DIAGNOSIS — E890 Postprocedural hypothyroidism: Secondary | ICD-10-CM | POA: Diagnosis present

## 2016-12-21 DIAGNOSIS — Z961 Presence of intraocular lens: Secondary | ICD-10-CM | POA: Diagnosis present

## 2016-12-21 DIAGNOSIS — R7303 Prediabetes: Secondary | ICD-10-CM

## 2016-12-21 DIAGNOSIS — J45909 Unspecified asthma, uncomplicated: Secondary | ICD-10-CM | POA: Diagnosis present

## 2016-12-21 DIAGNOSIS — Z885 Allergy status to narcotic agent status: Secondary | ICD-10-CM | POA: Diagnosis not present

## 2016-12-21 DIAGNOSIS — M21372 Foot drop, left foot: Secondary | ICD-10-CM | POA: Diagnosis present

## 2016-12-21 DIAGNOSIS — Z85828 Personal history of other malignant neoplasm of skin: Secondary | ICD-10-CM | POA: Diagnosis not present

## 2016-12-21 DIAGNOSIS — I8392 Asymptomatic varicose veins of left lower extremity: Secondary | ICD-10-CM | POA: Diagnosis present

## 2016-12-21 DIAGNOSIS — Z7902 Long term (current) use of antithrombotics/antiplatelets: Secondary | ICD-10-CM

## 2016-12-21 DIAGNOSIS — K219 Gastro-esophageal reflux disease without esophagitis: Secondary | ICD-10-CM | POA: Diagnosis present

## 2016-12-21 DIAGNOSIS — Z981 Arthrodesis status: Secondary | ICD-10-CM | POA: Diagnosis not present

## 2016-12-21 DIAGNOSIS — Z880 Allergy status to penicillin: Secondary | ICD-10-CM

## 2016-12-21 DIAGNOSIS — Z79891 Long term (current) use of opiate analgesic: Secondary | ICD-10-CM

## 2016-12-21 DIAGNOSIS — Z8249 Family history of ischemic heart disease and other diseases of the circulatory system: Secondary | ICD-10-CM | POA: Diagnosis not present

## 2016-12-21 DIAGNOSIS — Z888 Allergy status to other drugs, medicaments and biological substances status: Secondary | ICD-10-CM

## 2016-12-21 DIAGNOSIS — N390 Urinary tract infection, site not specified: Secondary | ICD-10-CM

## 2016-12-21 MED ORDER — SENNOSIDES-DOCUSATE SODIUM 8.6-50 MG PO TABS: 2.0000 | ORAL_TABLET | Freq: Every evening | ORAL | Status: DC | PRN

## 2016-12-21 MED ORDER — LEVOTHYROXINE SODIUM 112 MCG PO TABS
112.0000 ug | ORAL_TABLET | Freq: Every day | ORAL | Status: DC
  Administered 2016-12-22 – 2017-01-04 (×14): 112 ug via ORAL
  Filled 2016-12-21 (×15): qty 1

## 2016-12-21 MED ORDER — NITROGLYCERIN 0.4 MG SL SUBL: 0.4000 mg | SUBLINGUAL_TABLET | SUBLINGUAL | Status: DC | PRN

## 2016-12-21 MED ORDER — PROCHLORPERAZINE EDISYLATE 5 MG/ML IJ SOLN
5.0000 mg | Freq: Four times a day (QID) | INTRAMUSCULAR | Status: DC | PRN
Start: 2016-12-21 — End: 2017-01-04

## 2016-12-21 MED ORDER — BISACODYL 10 MG RE SUPP: 10.0000 mg | Freq: Every day | RECTAL | Status: DC | PRN

## 2016-12-21 MED ORDER — MOMETASONE FURO-FORMOTEROL FUM 200-5 MCG/ACT IN AERO
2.0000 | INHALATION_SPRAY | Freq: Two times a day (BID) | RESPIRATORY_TRACT | Status: DC
  Administered 2016-12-22 – 2017-01-04 (×23): 2 via RESPIRATORY_TRACT
  Filled 2016-12-21: qty 8.8

## 2016-12-21 MED ORDER — TAMSULOSIN HCL 0.4 MG PO CAPS
0.4000 mg | ORAL_CAPSULE | Freq: Every day | ORAL | Status: DC
  Administered 2016-12-22 – 2016-12-30 (×9): 0.4 mg via ORAL
  Filled 2016-12-21 (×9): qty 1

## 2016-12-21 MED ORDER — MENTHOL 3 MG MT LOZG
1.0000 | LOZENGE | OROMUCOSAL | Status: DC | PRN
  Filled 2016-12-21: qty 9

## 2016-12-21 MED ORDER — PHENOL 1.4 % MT LIQD: 1.0000 | OROMUCOSAL | Status: DC | PRN

## 2016-12-21 MED ORDER — ALUM & MAG HYDROXIDE-SIMETH 200-200-20 MG/5ML PO SUSP: 30.0000 mL | Freq: Four times a day (QID) | ORAL | Status: DC | PRN

## 2016-12-21 MED ORDER — POLYETHYLENE GLYCOL 3350 17 G PO PACK
17.0000 g | PACK | Freq: Every day | ORAL | Status: DC
  Administered 2016-12-22 – 2017-01-04 (×14): 17 g via ORAL
  Filled 2016-12-21 (×14): qty 1

## 2016-12-21 MED ORDER — PROCHLORPERAZINE 25 MG RE SUPP: 12.5000 mg | Freq: Four times a day (QID) | RECTAL | Status: DC | PRN

## 2016-12-21 MED ORDER — PROCHLORPERAZINE MALEATE 5 MG PO TABS: 5.0000 mg | ORAL_TABLET | Freq: Four times a day (QID) | ORAL | Status: DC | PRN

## 2016-12-21 MED ORDER — METOPROLOL SUCCINATE ER 50 MG PO TB24
50.0000 mg | ORAL_TABLET | Freq: Two times a day (BID) | ORAL | Status: DC
  Administered 2016-12-21 – 2017-01-04 (×28): 50 mg via ORAL
  Filled 2016-12-21 (×28): qty 1

## 2016-12-21 MED ORDER — METHOCARBAMOL 500 MG PO TABS
500.0000 mg | ORAL_TABLET | Freq: Four times a day (QID) | ORAL | Status: DC | PRN
  Administered 2016-12-21 – 2017-01-04 (×14): 500 mg via ORAL
  Filled 2016-12-21 (×14): qty 1

## 2016-12-21 MED ORDER — ROSUVASTATIN CALCIUM 5 MG PO TABS
5.0000 mg | ORAL_TABLET | Freq: Every day | ORAL | Status: DC
  Administered 2016-12-21 – 2017-01-03 (×14): 5 mg via ORAL
  Filled 2016-12-21 (×14): qty 1

## 2016-12-21 MED ORDER — ONDANSETRON HCL 4 MG PO TABS: 4.0000 mg | ORAL_TABLET | Freq: Four times a day (QID) | ORAL | Status: DC | PRN

## 2016-12-21 MED ORDER — CYCLOBENZAPRINE HCL 10 MG PO TABS: 10.0000 mg | ORAL_TABLET | Freq: Three times a day (TID) | ORAL | 0 refills | Status: DC | PRN

## 2016-12-21 MED ORDER — ONDANSETRON HCL 4 MG/2ML IJ SOLN: 4.0000 mg | Freq: Four times a day (QID) | INTRAMUSCULAR | Status: DC | PRN

## 2016-12-21 MED ORDER — FLEET ENEMA 7-19 GM/118ML RE ENEM: 1.0000 | ENEMA | Freq: Once | RECTAL | Status: DC | PRN

## 2016-12-21 MED ORDER — BETHANECHOL CHLORIDE 10 MG PO TABS
10.0000 mg | ORAL_TABLET | Freq: Four times a day (QID) | ORAL | Status: DC
  Administered 2016-12-21 – 2016-12-28 (×26): 10 mg via ORAL
  Filled 2016-12-21 (×26): qty 1

## 2016-12-21 MED ORDER — DOCUSATE SODIUM 100 MG PO CAPS
100.0000 mg | ORAL_CAPSULE | Freq: Two times a day (BID) | ORAL | Status: DC
  Administered 2016-12-21 – 2017-01-04 (×28): 100 mg via ORAL
  Filled 2016-12-21 (×29): qty 1

## 2016-12-21 MED ORDER — POLYETHYLENE GLYCOL 3350 17 G PO PACK: 17.0000 g | PACK | Freq: Every day | ORAL | Status: DC | PRN

## 2016-12-21 MED ORDER — GUAIFENESIN-DM 100-10 MG/5ML PO SYRP: 5.0000 mL | ORAL_SOLUTION | Freq: Four times a day (QID) | ORAL | Status: DC | PRN

## 2016-12-21 MED ORDER — DEXAMETHASONE SODIUM PHOSPHATE 10 MG/ML IJ SOLN
10.0000 mg | Freq: Four times a day (QID) | INTRAMUSCULAR | Status: DC
  Administered 2016-12-21 – 2016-12-22 (×2): 10 mg via INTRAVENOUS
  Filled 2016-12-21 (×5): qty 1

## 2016-12-21 MED ORDER — LIDOCAINE 5 % EX PTCH
1.0000 | MEDICATED_PATCH | CUTANEOUS | Status: DC
  Administered 2016-12-22 – 2017-01-03 (×14): 1 via TRANSDERMAL
  Filled 2016-12-21 (×14): qty 1

## 2016-12-21 MED ORDER — TRAMADOL HCL 50 MG PO TABS
50.0000 mg | ORAL_TABLET | Freq: Four times a day (QID) | ORAL | Status: DC | PRN
  Administered 2016-12-21 – 2017-01-03 (×5): 50 mg via ORAL
  Filled 2016-12-21 (×5): qty 1

## 2016-12-21 MED ORDER — ALBUTEROL SULFATE (2.5 MG/3ML) 0.083% IN NEBU: 3.0000 mL | INHALATION_SOLUTION | Freq: Four times a day (QID) | RESPIRATORY_TRACT | Status: DC | PRN

## 2016-12-21 MED ORDER — LORATADINE 10 MG PO TABS
10.0000 mg | ORAL_TABLET | Freq: Every day | ORAL | Status: DC
  Administered 2016-12-22 – 2017-01-04 (×14): 10 mg via ORAL
  Filled 2016-12-21 (×14): qty 1

## 2016-12-21 MED ORDER — DIPHENHYDRAMINE HCL 12.5 MG/5ML PO ELIX: 12.5000 mg | ORAL_SOLUTION | Freq: Four times a day (QID) | ORAL | Status: DC | PRN

## 2016-12-21 MED ORDER — TRAZODONE HCL 50 MG PO TABS
25.0000 mg | ORAL_TABLET | Freq: Every evening | ORAL | Status: DC | PRN
  Administered 2016-12-21 – 2017-01-03 (×11): 50 mg via ORAL
  Filled 2016-12-21 (×11): qty 1

## 2016-12-21 MED ORDER — GABAPENTIN 300 MG PO CAPS
300.0000 mg | ORAL_CAPSULE | Freq: Three times a day (TID) | ORAL | Status: DC
  Administered 2016-12-21 – 2017-01-04 (×42): 300 mg via ORAL
  Filled 2016-12-21 (×42): qty 1

## 2016-12-21 MED ORDER — HYDROCODONE-ACETAMINOPHEN 5-325 MG PO TABS: 1.0000 | ORAL_TABLET | ORAL | 0 refills | Status: DC | PRN

## 2016-12-21 MED ORDER — PANTOPRAZOLE SODIUM 40 MG IV SOLR: 40.0000 mg | Freq: Every day | INTRAVENOUS | Status: DC

## 2016-12-21 MED ORDER — ALUM & MAG HYDROXIDE-SIMETH 200-200-20 MG/5ML PO SUSP: 30.0000 mL | ORAL | Status: DC | PRN

## 2016-12-21 MED ORDER — HYDROCODONE-ACETAMINOPHEN 5-325 MG PO TABS
1.0000 | ORAL_TABLET | ORAL | Status: DC | PRN
  Administered 2016-12-22 – 2017-01-04 (×46): 1 via ORAL
  Filled 2016-12-21 (×47): qty 1

## 2016-12-21 MED ORDER — LORAZEPAM 1 MG PO TABS
1.0000 mg | ORAL_TABLET | Freq: Every day | ORAL | Status: DC
  Administered 2016-12-21 – 2017-01-03 (×14): 1 mg via ORAL
  Filled 2016-12-21 (×14): qty 1

## 2016-12-21 MED ORDER — ACETAMINOPHEN 325 MG PO TABS
325.0000 mg | ORAL_TABLET | ORAL | Status: DC | PRN
  Administered 2016-12-27 – 2016-12-28 (×2): 650 mg via ORAL
  Filled 2016-12-21 (×2): qty 2

## 2016-12-21 MED ORDER — PANTOPRAZOLE SODIUM 40 MG PO TBEC
40.0000 mg | DELAYED_RELEASE_TABLET | Freq: Every day | ORAL | Status: DC
  Administered 2016-12-21 – 2017-01-03 (×14): 40 mg via ORAL
  Filled 2016-12-21 (×14): qty 1

## 2016-12-21 NOTE — Progress Notes (Signed)
Patient arrived on unit with staff. Pt reports no pain, clean and dry. Dinner was present and pt is currently eating.
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
## 2016-12-21 NOTE — Progress Notes (Signed)
Rehab admissions - I have a bed today on inpatient rehab.  Will admit to inpatient rehab today.  Call me for questions.  #[phone]

## 2016-12-21 NOTE — Progress Notes (Signed)
Vitals:   12/20/16 1902 12/20/16 2010 12/20/16 2100 12/21/16 0416  BP: (!) 163/58  (!) 142/51 (!) 146/51  Pulse: 72  66 (!) 59  Resp:    18  Temp: 98.5 F (36.9 C)  99.3 F (37.4 C) 98.2 F (36.8 C)  TempSrc: Oral  Oral Oral  SpO2: 97% 98% 97% 96%  Weight:      Height:        CBC  Recent Labs  12/19/16 1415  WBC 12.4*  HGB 11.9*  HCT 36.4  PLT 255   BMET  Recent Labs  12/19/16 1415  NA 138  K 4.1  CL 102  CO2 29  GLUCOSE 171*  BUN 20  CREATININE 0.63  CALCIUM 8.7*   Patient resting comfortably in bed. No headache. Significant limitations regarding transfers, mobility, and ADLs persist. Ambulated about 10 feet using a rolling walker with the assistance of therapists and staff yesterday. Seen by PM&R yesterday, and to transfer to inpatient rehabilitation for CIR hopefully today. Continuing PT and OT.  Plan: Continuing rehabilitation. Transfer to rehabilitation hopefully today.  Hosie Spangle, MD 12/21/2016, 8:26 AM

## 2016-12-21 NOTE — H&P (Signed)
Physical Medicine and Rehabilitation Admission H&P    Chief Complaint  Patient presents with  . Back pain with radiculopathy due to lumbar stenosis and cord compression.     HPI:   Kim Mcgee is a 76 y.o. female with history of CAD, SOB, HTN, laryngospasm's, chronic dysphagia, progressive back pain radiating to BLE and LLE weakness for 3-4 weeks with left foot drop who was treated with [facility] 12/13/16. She was admitted on 7/18 with increase in pain and fall due to BLE giving away. MRI spine done revealing multifactorial degenerative changes with severe canal stenosis T 12-L1  and cord compression--slightly worse since 6/27 MRI. She was taken to OR on 7/19 for decompressive laminectomy T12-L1 with removal of large synovial cyst and decompression of T12 and L1 nerve roots with removal of loose L1 screws and arthrodesis as well as repair of inadvertent dural tear by Dr. Saintclair Halsted. Post op on bedrest till 7/22. She has had issues with confusion and progressive dyspnea without hypoxia--IS recommended.  pain control improving and she was started on decadron taper. Therapy ongoing and patient with significant deficits in mobility and ADL tasks. CIR was  recommended for follow up therapy   Review of Systems  HENT: Negative for hearing loss and tinnitus.   Eyes: Negative for blurred vision and double vision.  Respiratory: Positive for shortness of breath. Negative for cough and wheezing.   Cardiovascular: Negative for chest pain and palpitations.  Gastrointestinal: Negative for constipation, heartburn and nausea.  Genitourinary: Negative for dysuria and urgency.       Had incontinence prior to surgery  Musculoskeletal: Positive for back pain, joint pain (right hip pain) and myalgias.  Skin: Negative for itching and rash.  Neurological: Positive for dizziness, sensory change, focal weakness, weakness and headaches.     Past Medical History:  Diagnosis Date  . Anxiety   . Arthritis    lumbar DDD  .  Asthma   . Basal cell carcinoma of nose   . Chronic lower back pain   . Coronary artery disease   . Dysrhythmia   . GERD (gastroesophageal reflux disease)   . High cholesterol   . Hypertension   . Hypothyroidism   . Normal cardiac stress test   . PONV (postoperative nausea and vomiting)   . Shortness of breath    /w walking long distance   . Swallowing difficulty    being seen by ENT- had endoscopy- told that its probably related to past neck surgeries     Past Surgical History:  Procedure Laterality Date  . ANTERIOR CERVICAL DECOMP/DISCECTOMY FUSION  2010  . APPENDECTOMY    . BACK SURGERY    . BASAL CELL CARCINOMA EXCISION     "cut off nose"  . CARDIAC CATHETERIZATION  1970s; 1990s?; 2017  . CATARACT EXTRACTION W/ INTRAOCULAR LENS  IMPLANT, BILATERAL Bilateral   . CORONARY ANGIOPLASTY WITH STENT PLACEMENT  ~ 2017  . DECOMPRESSIVE LUMBAR LAMINECTOMY LEVEL 1 N/A 12/15/2016   Procedure: DECOMPRESSIVE  LAMINECTOMY Thoracic twelve to lumbar one;  Surgeon: Kary Kos, MD;  Location: [facility];  Service: Neurosurgery;  Laterality: N/A;  . DILATION AND CURETTAGE OF UTERUS    . OOPHORECTOMY     "?side"  . POSTERIOR FUSION LUMBAR SPINE  ~ 2013; 2016  . POSTERIOR LUMBAR FUSION 4 LEVEL N/A 12/15/2016   Procedure: Posterior Fusion Thoracic- ten to Lumbar- two;  Surgeon: Kary Kos, MD;  Location: [facility];  Service: Neurosurgery;  Laterality: N/A;  .  TONSILLECTOMY    . TOTAL THYROIDECTOMY  1990's  . TUBAL LIGATION      Family History  Problem Relation Age of Onset  . Cancer - Colon Mother   . Heart disease Father   . Bone cancer Brother   . Heart disease Brother     Social History:  Married. Independent prior to 4-5 weeks ago. Retired--worked as Engineer, production. She  reports that she has never smoked. She has never used smokeless tobacco. She reports that she does not drink alcohol or use drugs.    Allergies  Allergen Reactions  . Macrodantin [Nitrofurantoin] Other (See Comments)    Chest  pain  . Morphine And Related Nausea Only    CAUSED THE DRY HEAVES; CANNOT TAKE AGAIN!!  . Penicillins Rash and Other (See Comments)    Chest pain, also Has patient had a PCN reaction causing immediate rash, facial/tongue/throat swelling, SOB or lightheadedness with hypotension: Yes Has patient had a PCN reaction causing severe rash involving mucus membranes or skin necrosis: No Has patient had a PCN reaction that required hospitalization: No Has patient had a PCN reaction occurring within the last 10 years: Yes If all of the above answers are "NO", then may proceed with Cephalosporin use.   . Tape Rash    No adhesive tape, but COBAN WRAP IS TOLERATED     Medications Prior to Admission  Medication Sig Dispense Refill  . acetaminophen (TYLENOL) 325 MG tablet Take 325-650 mg by mouth every 6 (six) hours as needed (for pain or headaches).     Marland Kitchen albuterol (PROVENTIL HFA;VENTOLIN HFA) 108 (90 BASE) MCG/ACT inhaler Inhale 2 puffs into the lungs every 6 (six) hours as needed for shortness of breath.     . cephALEXin (KEFLEX) 500 MG capsule Take 500 mg by mouth See admin instructions. DAY BEFORE/DAY OF/DAY AFTER DENTAL APPOINTMENTS  50  . clopidogrel (PLAVIX) 75 MG tablet Take 75 mg by mouth daily.  3  . cyclobenzaprine (FLEXERIL) 10 MG tablet Take 1 tablet (10 mg total) by mouth 3 (three) times daily as needed for muscle spasms. For pain (Patient taking differently: Take 10 mg by mouth 3 (three) times daily as needed (for muscle spasms or pain). ) 50 tablet 3  . fexofenadine (ALLEGRA) 180 MG tablet Take 180 mg by mouth daily as needed for allergies.    . Fluticasone-Salmeterol (ADVAIR) 500-50 MCG/DOSE AEPB Inhale 1 puff into the lungs 2 (two) times daily as needed (for seasonal "flares").     . gabapentin (NEURONTIN) 300 MG capsule Take 300 mg by mouth 3 (three) times daily.  0  . HYDROcodone-acetaminophen (NORCO/VICODIN) 5-325 MG tablet Take 1 tablet by mouth every 4 (four) hours as needed.  (Patient taking differently: Take 1 tablet by mouth every 4 (four) hours as needed (for pain). ) 20 tablet 0  . levothyroxine (SYNTHROID, LEVOTHROID) 112 MCG tablet Take 112 mcg by mouth daily before breakfast.    . methocarbamol (ROBAXIN) 500 MG tablet Take 1 tablet (500 mg total) by mouth 3 (three) times daily between meals as needed. (Patient taking differently: Take 500 mg by mouth 3 (three) times daily as needed for muscle spasms. ) 20 tablet 0  . metoprolol succinate (TOPROL-XL) 50 MG 24 hr tablet Take 50 mg by mouth 2 (two) times daily.  2  . nitroGLYCERIN (NITROSTAT) 0.4 MG SL tablet Place 0.4 mg under the tongue every 5 (five) minutes x 3 doses as needed for chest pain.    . Omega-3 Fatty  Acids (FISH OIL PO) Take 1 capsule by mouth daily.    . ondansetron (ZOFRAN ODT) 4 MG disintegrating tablet Take 1 tablet (4 mg total) by mouth every 8 (eight) hours as needed for nausea. 6 tablet 0  . oxazepam (SERAX) 15 MG capsule Take 15 mg by mouth at bedtime as needed for anxiety.   0  . pantoprazole (PROTONIX) 40 MG tablet Take 40 mg by mouth daily before breakfast.  3  . polyethylene glycol (MIRALAX / GLYCOLAX) packet Take 17 g by mouth daily. 14 each   . rosuvastatin (CRESTOR) 5 MG tablet Take 5 mg by mouth at bedtime.   1  . Tamsulosin HCl (FLOMAX) 0.4 MG CAPS Take 1 capsule (0.4 mg total) by mouth daily. 30 capsule   . traMADol (ULTRAM) 50 MG tablet Take 50 mg by mouth every 6 (six) hours as needed for pain.  0  . bethanechol (URECHOLINE) 25 MG tablet Take 1 tablet (25 mg total) by mouth 3 (three) times daily. (Patient not taking: Reported on 12/15/2016)    . dexamethasone (DECADRON) 4 MG tablet Take 1 tablet (4 mg total) by mouth 2 (two) times daily. (Patient not taking: Reported on 12/15/2016) 10 tablet 0  . oxyCODONE-acetaminophen (PERCOCET/ROXICET) 5-325 MG per tablet Take 1-2 tablets by mouth every 4 (four) hours as needed. (Patient not taking: Reported on 12/15/2016) 61 tablet 0  .  oxyCODONE-acetaminophen (PERCOCET/ROXICET) 5-325 MG per tablet Take 1-2 tablets by mouth every 4 (four) hours as needed for moderate pain. (Patient not taking: Reported on 12/15/2016) 60 tablet 0  . oxyCODONE-acetaminophen (PERCOCET/ROXICET) 5-325 MG per tablet Take 1-2 tablets by mouth every 6 (six) hours as needed for moderate pain. (Patient not taking: Reported on 12/15/2016) 80 tablet 0  . pregabalin (LYRICA) 75 MG capsule Take 1 capsule (75 mg total) by mouth 2 (two) times daily. (Patient not taking: Reported on 12/15/2016)      Home: Home Living Family/patient expects to be discharged to:: Private residence Living Arrangements: Spouse/significant other Available Help at Discharge: Family, Available 24 hours/day Type of Home: House Home Access: Stairs to enter Technical brewer of Steps: 4 Home Layout: One level Bathroom Shower/Tub: Multimedia programmer: [place]: Environmental consultant - 2 wheels, [employer] - single point, Wheelchair - manual, Bedside commode, Shower seat, Adaptive equipment   Functional History: Prior Function Level of Independence: Independent Comments: Several previous back surgeries  Functional Status:  Mobility: Bed Mobility Overal bed mobility: Needs Assistance Bed Mobility: Rolling, Sidelying to Sit, Sit to Sidelying Rolling: Supervision Sidelying to sit: Min assist Sit to sidelying: Min assist General bed mobility comments: min A to elevate trunk to sit EOB and with LEs back onto bed Transfers Overall transfer level: Needs assistance Equipment used: Rolling walker (2 wheeled) Transfers: Sit to/from Stand Sit to Stand: Mod assist General transfer comment: Pt politely declined stating that she had just gotten back into bed Ambulation/Gait Ambulation/Gait assistance: Min assist, +2 safety/equipment, Mod assist (chair follow) Ambulation Distance (Feet): 12 Feet Assistive device: Rolling walker (2 wheeled) Gait Pattern/deviations: Step-through  pattern, Decreased step length - right, Decreased step length - left, Decreased dorsiflexion - left, Shuffle, Trunk flexed General Gait Details: shuffling gait initially; multimodal cues for posture and vc for safe use of AD, sequencing, and increased bilat step length/height; pt with difficulty advancing L LE Gait velocity: decreased  Gait velocity interpretation: Below normal speed for age/gender    ADL: ADL Overall ADL's : Needs assistance/impaired Eating/Feeding: Set up, Sitting Grooming: Set  up, Sitting Upper Body Bathing: Minimal assistance, Sitting Lower Body Bathing: Maximal assistance, Sit to/from stand Upper Body Dressing : Minimal assistance, Sitting Lower Body Dressing: Maximal assistance, Sit to/from stand Functional mobility during ADLs: Minimal assistance, Rolling walker General ADL Comments: pt agreeable to EOB sitting, but no OOB acitivity with OT. Reviewed use fo ADL A/E andpt able to verbalize proper use  Cognition: Cognition Overall Cognitive Status: Within Functional Limits for tasks assessed Orientation Level: Oriented X4 Cognition Arousal/Alertness: Awake/alert Behavior During Therapy: WFL for tasks assessed/performed Overall Cognitive Status: Within Functional Limits for tasks assessed    Blood pressure (!) 146/51, pulse (!) 59, temperature 98.2 F (36.8 C), temperature source Oral, resp. rate 18, height _0  (1.575 m), weight 74.4 kg (164 lb), SpO2 97 %. Physical Exam  Nursing note and vitals reviewed. Constitutional: She is oriented to person, place, and time. She appears well-developed and well-nourished. Nasal cannula in place.  HENT:  Head: Normocephalic and atraumatic.  Eyes: Pupils are equal, round, and reactive to light. Conjunctivae and EOM are normal.  Neck: Normal range of motion. Neck supple.  Cardiovascular: Normal rate and regular rhythm.  Exam reveals no friction rub.   No murmur heard. Respiratory: Effort normal. No respiratory  distress. She has no wheezes. She has no rales.  GI: Soft. Bowel sounds are normal. She exhibits no distension. There is no tenderness.  Musculoskeletal: She exhibits no edema or tenderness.  Neurological: She is alert and oriented to person, place, and time.  UE grossly 5/5. Decreased sensation below the waist, left more so than right. Motor: LLE 1+ to 1/5 prox to distal. RLE: 2 to 2+/5 prox to distal. DTR's 1+.   Skin: Skin is warm and dry.  Psychiatric: She has a normal mood and affect. Her behavior is normal. Judgment and thought content normal.    Results for orders placed or performed during the hospital encounter of 12/15/16 (from the past 48 hour(s))  CBC with Differential/Platelet     Status: Abnormal   Collection Time: 12/19/16  2:15 PM  Result Value Ref Range   WBC 12.4 (H) 4.0 - 10.5 K/uL   RBC 4.12 3.87 - 5.11 MIL/uL   Hemoglobin 11.9 (L) 12.0 - 15.0 g/dL   HCT 36.4 36.0 - 46.0 %   MCV 88.3 78.0 - 100.0 fL   MCH 28.9 26.0 - 34.0 pg   MCHC 32.7 30.0 - 36.0 g/dL   RDW 14.4 11.5 - 15.5 %   Platelets 255 150 - 400 K/uL   Neutrophils Relative % 80 %   Neutro Abs 9.8 (H) 1.7 - 7.7 K/uL   Lymphocytes Relative 11 %   Lymphs Abs 1.4 0.7 - 4.0 K/uL   Monocytes Relative 9 %   Monocytes Absolute 1.1 (H) 0.1 - 1.0 K/uL   Eosinophils Relative 0 %   Eosinophils Absolute 0.0 0.0 - 0.7 K/uL   Basophils Relative 0 %   Basophils Absolute 0.0 0.0 - 0.1 K/uL  Comprehensive metabolic panel     Status: Abnormal   Collection Time: 12/19/16  2:15 PM  Result Value Ref Range   Sodium 138 135 - 145 mmol/L   Potassium 4.1 3.5 - 5.1 mmol/L   Chloride 102 101 - 111 mmol/L   CO2 29 22 - 32 mmol/L   Glucose, Bld 171 (H) 65 - 99 mg/dL   BUN 20 6 - 20 mg/dL   Creatinine, Ser 0.63 0.44 - 1.00 mg/dL   Calcium 8.7 (L) 8.9 - 10.3 mg/dL  Total Protein 5.3 (L) 6.5 - 8.1 g/dL   Albumin 2.7 (L) 3.5 - 5.0 g/dL   AST 47 (H) 15 - 41 U/L   ALT 60 (H) 14 - 54 U/L   Alkaline Phosphatase 43 38 - 126 U/L     Total Bilirubin 0.7 0.3 - 1.2 mg/dL   GFR calc non Af Amer >60 >60 mL/min   GFR calc Af Amer >60 >60 mL/min    Comment: (NOTE) The eGFR has been calculated using the CKD EPI equation. This calculation has not been validated in all clinical situations. eGFR's persistently <60 mL/min signify possible Chronic Kidney Disease.    Anion gap 7 5 - 15   No results found.     Medical Problem List and Plan: 1.  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  DVT Prophylaxis/Anticoagulation: Mechanical: Sequential compression devices, below knee Bilateral lower extremities 3. Pain Management: Improving. Continue prn hydrocodone/flexeril.  4. Mood: team to  Provide ego support. LCSW to follow for evaluation and support.  5. Neuropsych: This patient is capable of making decisions on her own behalf. 6. Skin/Wound Care: Routine pressure relief measures.  Monitor wound daily.  7. Fluids/Electrolytes/Nutrition: maintain adequate nutritonal and hydration status. Check lytes in am.  8.  HTN: Monitor BP bid. Continue Toprol.  9. H/o urinary incontinence/retention: has resolved--will monitor voiding with PVR checks x 24 hours.  Continue Flomax (home med) 10.  Chronic dysphagia/GERD: On Protonix with reflux precautions.  11. Leucocytosis: Likely reactive. Check UA/UCS.  12. Hyperglycemia: Question due to stress v/s steroids. Will check Hgb A1c.  13. H/o Asthma with SOB: On dulera with albuterol prn.  Encourage IS every hour.  14. Anxiety d/o: Managed with Serax at nights--ativan substituted   Post Admission Physician Evaluation: 1. Functional deficits secondary  to T12 myelopathy. 2. Patient is admitted to receive collaborative, interdisciplinary care between the physiatrist, rehab nursing staff, and therapy team. 3. Patient's level of medical complexity and substantial therapy needs in context of that medical necessity cannot be provided at a lesser  intensity of care such as a SNF. 4. Patient has experienced substantial functional loss from his/her baseline which was documented above under the "Functional History" and "Functional Status" headings.  Judging by the patient's diagnosis, physical exam, and functional history, the patient has potential for functional progress which will result in measurable gains while on inpatient rehab.  These gains will be of substantial and practical use upon discharge  in facilitating mobility and self-care at the household level. 5. Physiatrist will provide 24 hour management of medical needs as well as oversight of the therapy plan/treatment and provide guidance as appropriate regarding the interaction of the two. 6. The Preadmission Screening has been reviewed and patient status is unchanged unless otherwise stated above. 7. 24 hour rehab nursing will assist with bladder management, bowel management, safety, skin/wound care, disease management, medication administration, pain management and patient education  and help integrate therapy concepts, techniques,education, etc. 8. PT will assess and treat for/with: Lower extremity strength, range of motion, stamina, balance, functional mobility, safety, adaptive techniques and equipment, post-operative precautions, orthotics, pain control, family education.   Goals are: supervision. 9. OT will assess and treat for/with: ADL's, functional mobility, safety, upper extremity strength, adaptive techniques and equipment, NMR, pain control, back precautions, family ed.   Goals are: mod I to supervision. Therapy may proceed with showering this patient. 10. SLP will assess and treat for/with: n/a.  Goals are:  n/a. 11. Case Management and Social Worker will assess and treat for psychological issues and discharge planning. 12. Team conference will be held weekly to assess progress toward goals and to determine barriers to discharge. 13. Patient will receive at least 3 hours of  therapy per day at least 5 days per week. 14. ELOS: 12-17 days       15. Prognosis:  excellent     Meredith Staggers, MD, [facility] 12/21/2016  Bary Leriche, Hershal Coria 12/21/2016

## 2016-12-21 NOTE — Progress Notes (Signed)
Occupational Therapy Treatment Patient Details Name: Kim Mcgee MRN: [id number] DOB: 1940-07-29 Today's Date: 12/21/2016    History of present illness 76 y.o. female with medical history significant for CAD,  asthma, HLD, hypertension, hypothyroidism, L-S1 fusion and arthritis. Admitted with back pain and LLE weakness with spinal hematoma T12-L1 s/p evacuation 7/19 with dural tear. dyspnea post op with bil atelecstasis   OT comments  Pt declined OOB activity due to fatigue and just getting back into ed with nurse tech assist. Pt agreeable to sit EOB this visit. OT will continue to follow  Follow Up Recommendations  CIR;Supervision/Assistance - 24 hour    Equipment Recommendations  Other (comment) (TBD at next venue of care)    Recommendations for Other Services      Precautions / Restrictions Precautions Precautions: Back;Fall Precaution Comments: Reviewed back precations Restrictions Weight Bearing Restrictions: No       Mobility Bed Mobility Overal bed mobility: Needs Assistance Bed Mobility: Rolling;Sidelying to Sit;Sit to Sidelying Rolling: Supervision Sidelying to sit: Min assist     Sit to sidelying: Min assist General bed mobility comments: min A to elevate trunk to sit EOB and with LEs back onto bed  Transfers Overall transfer level: Needs assistance Equipment used: Rolling walker (2 wheeled) Transfers: Sit to/from Stand Sit to Stand: Mod assist         General transfer comment: Pt politely declined stating that she had just gotten back into bed    Balance Overall balance assessment: Needs assistance Sitting-balance support: Feet supported;No upper extremity supported Sitting balance-Leahy Scale: Fair     Standing balance support: During functional activity;Bilateral upper extremity supported Standing balance-Leahy Scale: Poor                             ADL either performed or assessed with clinical judgement   ADL Overall ADL's :  Needs assistance/impaired                                       General ADL Comments: pt agreeable to EOB sitting, but no OOB acitivity with OT. Reviewed use fo ADL A/E andpt able to verbalize proper use     Vision Patient Visual Report: No change from baseline     Perception     Praxis      Cognition Arousal/Alertness: Awake/alert Behavior During Therapy: WFL for tasks assessed/performed Overall Cognitive Status: Within Functional Limits for tasks assessed                                                     General Comments      Pertinent Vitals/ Pain       Pain Assessment: 0-10 Pain Score: 3  Faces Pain Scale: Hurts a little bit Pain Location: back with mobility Pain Descriptors / Indicators: Aching;Sore;Discomfort Pain Intervention(s): Limited activity within patient's tolerance;Monitored during session;Premedicated before session;Repositioned  Home Living                                          Prior Functioning/Environment  Frequency  Min 2X/week        Progress Toward Goals  OT Goals(current goals can now be found in the care plan section)  Progress towards OT goals: Progressing toward goals     Plan Discharge plan remains appropriate    Co-evaluation                 AM-PAC PT "6 Clicks" Daily Activity     Outcome Measure   Help from another person eating meals?: None Help from another person taking care of personal grooming?: A Little Help from another person toileting, which includes using toliet, bedpan, or urinal?: A Lot Help from another person bathing (including washing, rinsing, drying)?: A Lot Help from another person to put on and taking off regular upper body clothing?: A Little Help from another person to put on and taking off regular lower body clothing?: A Lot 6 Click Score: 16    End of Session    OT Visit Diagnosis: Unsteadiness on feet  (R26.81);Other abnormalities of gait and mobility (R26.89);Muscle weakness (generalized) (M62.81);History of falling (Z91.81);Pain Pain - Right/Left:  (back) Pain - part of body:  (back)   Activity Tolerance Patient limited by fatigue   Patient Left in bed;with call bell/phone within reach   Nurse Communication      Functional Assessment Tool Used: AM-PAC 6 Clicks Daily Activity   Time: 1010-1028 OT Time Calculation (min): 18 min  Charges: OT G-codes **NOT FOR INPATIENT CLASS** Functional Assessment Tool Used: AM-PAC 6 Clicks Daily Activity OT General Charges $OT Visit: 1 Procedure OT Treatments $Therapeutic Activity: 8-22 mins     Britt Bottom 12/21/2016, 12:14 PM

## 2016-12-21 NOTE — Progress Notes (Signed)
Physical Therapy Treatment Patient Details Name: Kim Mcgee MRN: [id number] DOB: [birth year] Today's Date: 12/21/2016    History of Present Illness 76 y.o. female with medical history significant for CAD,  asthma, HLD, hypertension, hypothyroidism, L-S1 fusion and arthritis. Admitted with back pain and LLE weakness with spinal hematoma T12-L1 s/p evacuation 7/19 with dural tear. dyspnea post op with bil atelecstasis    PT Comments    Patient is progressing gradually toward mobility goals. Current plan remains appropriate.    Follow Up Recommendations  CIR;Supervision/Assistance - 24 hour     Equipment Recommendations  None recommended by PT    Recommendations for Other Services OT consult;Rehab consult     Precautions / Restrictions Precautions Precautions: Back;Fall Precaution Comments: Reviewed back precations Restrictions Weight Bearing Restrictions: No    Mobility  Bed Mobility Overal bed mobility: Needs Assistance Bed Mobility: Rolling;Sidelying to Sit Rolling: Min guard Sidelying to sit: Min assist       General bed mobility comments: assist to elevate trunk into sitting  Transfers Overall transfer level: Needs assistance Equipment used: Rolling walker (2 wheeled) Transfers: Sit to/from Stand Sit to Stand: Mod assist         General transfer comment: assist to power up into standing and L LE blocked; cues for safe hand placement and technique  Ambulation/Gait Ambulation/Gait assistance: Min assist;+2 safety/equipment;Mod assist (chair follow) Ambulation Distance (Feet): 12 Feet Assistive device: Rolling walker (2 wheeled) Gait Pattern/deviations: Step-through pattern;Decreased step length - right;Decreased step length - left;Decreased dorsiflexion - left;Shuffle;Trunk flexed Gait velocity: decreased    General Gait Details: shuffling gait initially; multimodal cues for posture and vc for safe use of AD, sequencing, and increased bilat step  length/height; pt with difficulty advancing L LE   Stairs            Wheelchair Mobility    Modified Rankin (Stroke Patients Only)       Balance Overall balance assessment: Needs assistance Sitting-balance support: Feet supported;No upper extremity supported Sitting balance-Leahy Scale: Fair     Standing balance support: During functional activity;Bilateral upper extremity supported Standing balance-Leahy Scale: Poor                              Cognition Arousal/Alertness: Awake/alert Behavior During Therapy: WFL for tasks assessed/performed Overall Cognitive Status: Within Functional Limits for tasks assessed                                        Exercises      General Comments        Pertinent Vitals/Pain Pain Assessment: Faces Faces Pain Scale: Hurts a little bit Pain Location: bilat LE and back Pain Descriptors / Indicators: Discomfort;Aching Pain Intervention(s): Limited activity within patient's tolerance;Monitored during session;Premedicated before session;Repositioned    Home Living                      Prior Function            PT Goals (current goals can now be found in the care plan section) Progress towards PT goals: Progressing toward goals    Frequency    Min 5X/week      PT Plan Current plan remains appropriate    Co-evaluation              AM-PAC PT "6 Clicks" Daily Activity  Outcome Measure  Difficulty turning over in bed (including adjusting bedclothes, sheets and blankets)?: A Lot Difficulty moving from lying on back to sitting on the side of the bed? : Total Difficulty sitting down on and standing up from a chair with arms (e.g., wheelchair, bedside commode, etc,.)?: Total Help needed moving to and from a bed to chair (including a wheelchair)?: A Little Help needed walking in hospital room?: A Little Help needed climbing 3-5 steps with a railing? : A Lot 6 Click Score: 12     End of Session Equipment Utilized During Treatment: Gait belt Activity Tolerance: Patient limited by fatigue;Patient tolerated treatment well Patient left: in chair;with call bell/phone within reach Nurse Communication: Mobility status PT Visit Diagnosis: Other abnormalities of gait and mobility (R26.89);Difficulty in walking, not elsewhere classified (R26.2);Muscle weakness (generalized) (M62.81)     Time: 8756-4332 PT Time Calculation (min) (ACUTE ONLY): 22 min  Charges:  $Gait Training: 8-22 mins                    G Codes:       Earney Navy, PTA Pager: [phone]     Darliss Cheney 12/21/2016, 11:47 AM

## 2016-12-21 NOTE — PMR Pre-admission (Signed)
PMR Admission Coordinator Pre-Admission Assessment  Patient: Kim Mcgee is an 76 y.o., female MRN: [id number] DOB: [birth year] Height: 5\' 2"  (157.5 cm) Weight: 74.4 kg (164 lb)              Insurance Information HMO: No   PPO:       PCP:       IPA:       80/20:       OTHER:   PRIMARY: Medicare a/B      Policy#: [id number]      Subscriber: Bary Richard CM Name:        Phone#:       Fax#:   Pre-Cert#:        Employer:  Retired Benefits:  Phone #:       Name: Checked in [place]. Date: 05/31/16     Deduct:  $1340      Out of Pocket Max: none      Life Max: N/A CIR: 100%      SNF: 100 days Outpatient: 80%     Co-Pay: 20% Home Health: 100%      Co-Pay: none DME: 80%     Co-Pay: 20% Providers: patient's choice  SECONDARY: BCBS      Policy#: [id number]      Subscriber: Bary Richard CM Name:        Phone#:       Fax#:   Pre-Cert#:        Employer: Retired Benefits:  Phone #: [phone]     Name:   Irene Shipper. Date:       Deduct:        Out of Pocket Max:        Life Max:   CIR:        SNF:   Outpatient:       Co-Pay:   Home Health:        Co-Pay:   DME:       Co-Pay:    Emergency Contact Information Contact Information    Name Relation Home Work Mobile   [place] Spouse [phone]  [place] Daughter   [phone]   Cherre Blanc Daughter [phone]  [phone]     Current Medical History  Patient Admitting Diagnosis: Lumbar myelopathy   History of Present Illness:  A 76 y.o. female with history of CAD, SOB, HTN, laryngospasm's, progressive back pain radiating to BLE and LLE weakness for 3-4 weeks with left foot drop who was treated with [facility] 12/13/16. She was admitted on 7/18 with increase in pain and fall due to BLE giving away. MRI spine done revealing multifactorial degenerative changes with severe canal stenosis T 12-L1  and cord compression--slightly worse since 6/27 MRI. She was taken to OR on 7/19 for decompressive laminectomy  T12-L1 with removal of large synovial cyst and decompression of T12 and L1 nerve roots with removal of loose L1 screws and arthrodesis as well as repair of inadvertent dural tear by Dr. Saintclair Halsted. Post op on bedrest till 7/22. She has had issues with confusion and progressive dyspnea without hypoxia--IS recommended.  Therapy evaluations done and CIR recommended for follow up therapy. Was independent without AD till about 4-5 weeks ago. Was getting week and using walker 2 weeks PTA. She was able to sit up for 60 minutes yesterday and has been getting up to [facility] with assistance.    Past Medical History  Past Medical History:  Diagnosis Date  . Anxiety   . Arthritis  lumbar DDD  . Asthma   . Basal cell carcinoma of nose   . Chronic lower back pain   . Coronary artery disease   . Dysrhythmia   . GERD (gastroesophageal reflux disease)   . High cholesterol   . Hypertension   . Hypothyroidism   . Normal cardiac stress test   . PONV (postoperative nausea and vomiting)   . Shortness of breath    /w walking long distance   . Swallowing difficulty    being seen by ENT- had endoscopy- told that its probably related to past neck surgeries     Family History  family history includes Bone cancer in her brother; Cancer - Colon in her mother; Heart disease in her brother and father.  Prior Rehab/Hospitalizations: Had outpatient therapy at [facility], went to water therapy for trouble standing.  Has the patient had major surgery during 100 days prior to admission? No  Current Medications   Current Facility-Administered Medications:  .  0.9 %  sodium chloride infusion, 250 mL, Intravenous, Continuous, Kary Kos, MD, Last Rate: 1 mL/hr at 12/16/16 0614, 250 mL at 12/16/16 0614 .  acetaminophen (TYLENOL) tablet 650 mg, 650 mg, Oral, Q4H PRN **OR** acetaminophen (TYLENOL) suppository 650 mg, 650 mg, Rectal, Q4H PRN, Kary Kos, MD .  albuterol (PROVENTIL) (2.5 MG/3ML) 0.083% nebulizer solution  3 mL, 3 mL, Inhalation, Q6H PRN, Kary Kos, MD, 3 mL at 12/17/16 0914 .  alum & mag hydroxide-simeth (MAALOX/MYLANTA) 200-200-20 MG/5ML suspension 30 mL, 30 mL, Oral, Q6H PRN, Kary Kos, MD .  bupivacaine liposome (EXPAREL) 1.3 % injection 266 mg, 20 mL, Infiltration, Once, Kary Kos, MD .  cyclobenzaprine (FLEXERIL) tablet 10 mg, 10 mg, Oral, TID PRN, Kary Kos, MD, 10 mg at 12/21/16 0603 .  dexamethasone (DECADRON) injection 10 mg, 10 mg, Intravenous, Q6H, Kary Kos, MD, 10 mg at 12/21/16 1221 .  docusate sodium (COLACE) capsule 100 mg, 100 mg, Oral, BID, Kary Kos, MD, 100 mg at 12/21/16 0956 .  gabapentin (NEURONTIN) capsule 300 mg, 300 mg, Oral, TID, Kary Kos, MD, 300 mg at 12/21/16 0956 .  HYDROcodone-acetaminophen (NORCO/VICODIN) 5-325 MG per tablet 1 tablet, 1 tablet, Oral, Q4H PRN, Kary Kos, MD, 1 tablet at 12/21/16 1013 .  HYDROmorphone (DILAUDID) injection 1 mg, 1 mg, Intravenous, Q2H PRN, Kary Kos, MD, 1 mg at 12/19/16 1325 .  levothyroxine (SYNTHROID, LEVOTHROID) tablet 112 mcg, 112 mcg, Oral, QAC breakfast, Kary Kos, MD, 112 mcg at 12/21/16 0956 .  lidocaine (LIDODERM) 5 % 1 patch, 1 patch, Transdermal, Q24H, Lavina Hamman, MD, 1 patch at 12/21/16 1003 .  loratadine (CLARITIN) tablet 10 mg, 10 mg, Oral, Daily, Kary Kos, MD, 10 mg at 12/21/16 0956 .  LORazepam (ATIVAN) tablet 1 mg, 1 mg, Oral, QHS, Kary Kos, MD, 1 mg at 12/20/16 2129 .  menthol-cetylpyridinium (CEPACOL) lozenge 3 mg, 1 lozenge, Oral, PRN **OR** phenol (CHLORASEPTIC) mouth spray 1 spray, 1 spray, Mouth/Throat, PRN, Kary Kos, MD .  metoprolol succinate (TOPROL-XL) 24 hr tablet 50 mg, 50 mg, Oral, BID, Kary Kos, MD, 50 mg at 12/21/16 0956 .  mometasone-formoterol (DULERA) 200-5 MCG/ACT inhaler 2 puff, 2 puff, Inhalation, BID, Kary Kos, MD, 2 puff at 12/21/16 [phone] .  nitroGLYCERIN (NITROSTAT) SL tablet 0.4 mg, 0.4 mg, Sublingual, Q5 Min x 3 PRN, Kary Kos, MD .  ondansetron [facility]) tablet 4 mg, 4  mg, Oral, Q6H PRN **OR** ondansetron (ZOFRAN) injection 4 mg, 4 mg, Intravenous, Q6H PRN, Kary Kos, MD, 4 mg  at 12/16/16 1236 .  pantoprazole (PROTONIX) injection 40 mg, 40 mg, Intravenous, QHS, Kary Kos, MD, 40 mg at 12/20/16 2124 .  polyethylene glycol (MIRALAX / GLYCOLAX) packet 17 g, 17 g, Oral, Daily, Kary Kos, MD, 17 g at 12/21/16 0955 .  rosuvastatin (CRESTOR) tablet 5 mg, 5 mg, Oral, QHS, Kary Kos, MD, 5 mg at 12/20/16 2127 .  senna-docusate (Senokot-S) tablet 1 tablet, 1 tablet, Oral, QHS PRN, Kary Kos, MD, 1 tablet at 12/18/16 2034 .  sodium chloride flush (NS) 0.9 % injection 3 mL, 3 mL, Intravenous, Q12H, Kary Kos, MD, 3 mL at 12/21/16 1004 .  sodium chloride flush (NS) 0.9 % injection 3 mL, 3 mL, Intravenous, PRN, Kary Kos, MD .  tamsulosin [facility]) capsule 0.4 mg, 0.4 mg, Oral, Daily, Kary Kos, MD, 0.4 mg at 12/21/16 0956 .  traMADol (ULTRAM) tablet 50 mg, 50 mg, Oral, Q6H PRN, Kary Kos, MD, 50 mg at 12/20/16 1837 .  vancomycin (VANCOCIN) IVPB 750 mg/150 ml premix, 750 mg, Intravenous, Q12H, Erenest Blank, RPH, Stopped at 12/21/16 1135  Patients Current Diet: Diet regular Room service appropriate? Yes; Fluid consistency: Thin  Precautions / Restrictions Precautions Precautions: Back, Fall Precaution Booklet Issued: Yes (comment) Precaution Comments: Reviewed back precations Restrictions Weight Bearing Restrictions: No   Has the patient had 2 or more falls or a fall with injury in the past year?Yes.  Reports 2 falls with injury to right knee and ankle.  Prior Activity Level Community (5-7x/wk): Went out daily when she was feeling good.  Home Assistive Devices / Equipment Home Assistive Devices/Equipment: Cane (specify quad or straight) (quad and straight) Home Equipment: Walker - 2 wheels, Cane - single point, Wheelchair - manual, Bedside commode, Shower seat, Adaptive equipment  Prior Device Use: Indicate devices/aids used by the patient prior to  current illness, exacerbation or injury? Walker and [employer]  Prior Functional Level Prior Function Level of Independence: Independent Comments: Several previous back surgeries  Self Care: Did the patient need help bathing, dressing, using the toilet or eating?  Independent  Indoor Mobility: Did the patient need assistance with walking from room to room (with or without device)? Independent  Stairs: Did the patient need assistance with internal or external stairs (with or without device)? Independent  Functional Cognition: Did the patient need help planning regular tasks such as shopping or remembering to take medications? Independent  Current Functional Level Cognition  Overall Cognitive Status: Within Functional Limits for tasks assessed Orientation Level: Oriented X4    Extremity Assessment (includes Sensation/Coordination)  Upper Extremity Assessment: Generalized weakness  Lower Extremity Assessment: Defer to PT evaluation RLE Deficits / Details: hip flexion 2/5, knee extension 2+/5, knee flexion 2+/5 RLE Sensation: decreased light touch LLE Deficits / Details: hip flexion 2/5, knee extension 2/5, knee flexion 2-/5 LLE Sensation: decreased light touch    ADLs  Overall ADL's : Needs assistance/impaired Eating/Feeding: Set up, Sitting Grooming: Set up, Sitting Upper Body Bathing: Minimal assistance, Sitting Lower Body Bathing: Maximal assistance, Sit to/from stand Upper Body Dressing : Minimal assistance, Sitting Lower Body Dressing: Maximal assistance, Sit to/from stand Functional mobility during ADLs: Minimal assistance, Rolling walker General ADL Comments: pt agreeable to EOB sitting, but no OOB acitivity with OT. Reviewed use fo ADL A/E andpt able to verbalize proper use    Mobility  Overal bed mobility: Needs Assistance Bed Mobility: Rolling, Sidelying to Sit, Sit to Sidelying Rolling: Supervision Sidelying to sit: Min assist Sit to sidelying: Min assist General bed  mobility  comments: min A to elevate trunk to sit EOB and with LEs back onto bed    Transfers  Overall transfer level: Needs assistance Equipment used: Rolling walker (2 wheeled) Transfers: Sit to/from Stand Sit to Stand: Mod assist General transfer comment: Pt politely declined stating that she had just gotten back into bed    Ambulation / Gait / Stairs / Wheelchair Mobility  Ambulation/Gait Ambulation/Gait assistance: Min assist, +2 safety/equipment, Mod assist (chair follow) Ambulation Distance (Feet): 12 Feet Assistive device: Rolling walker (2 wheeled) Gait Pattern/deviations: Step-through pattern, Decreased step length - right, Decreased step length - left, Decreased dorsiflexion - left, Shuffle, Trunk flexed General Gait Details: shuffling gait initially; multimodal cues for posture and vc for safe use of AD, sequencing, and increased bilat step length/height; pt with difficulty advancing L LE Gait velocity: decreased  Gait velocity interpretation: Below normal speed for age/gender    Posture / Balance Balance Overall balance assessment: Needs assistance Sitting-balance support: Feet supported, No upper extremity supported Sitting balance-Leahy Scale: Fair Standing balance support: During functional activity, Bilateral upper extremity supported Standing balance-Leahy Scale: Poor    Special needs/care consideration BiPAP/CPAP No CPM No Continuous Drip IV No Dialysis No       Life Vest No Oxygen No Special Bed No Trach Size No Wound Vac (area) no     Skin Post op back incision.  Skin is delicate and has tape burns on left side of face.                         Bowel mgmt: Last BM 12/20/16 Bladder mgmt: External catheter Diabetic mgmt No    Previous Home Environment Living Arrangements: Spouse/significant other Available Help at Discharge: Family, Available 24 hours/day Type of Home: House Home Layout: One level Home Access: Stairs to enter [employer] of  Steps: 4 Bathroom Shower/Tub: Multimedia programmer: [place]: No  Discharge Living Setting Plans for Discharge Living Setting: Patient's home, House, Lives with (comment) (Lives with husband.  Dtr staying in the home at this time.) Type of Home at Discharge: House Discharge Home Layout: Two level, Able to live on main level with bedroom/bathroom Alternate Level Stairs-Number of Steps: Flight Discharge Home Access: Stairs to enter [employer] of Steps: 2 steps at the front, 3 steps at the back and small threshold step at the side entry Does the patient have any problems obtaining your medications?: No  Social/Family/Support Systems Patient Roles: Spouse, Parent (Has a husband and a daughter.) Contact Information: Marcea Rojek - husband Anticipated Caregiver: husband Anticipated Caregiver's Contact Information: Delfino Lovett - husband - [phone] Ability/Limitations of Caregiver: Husband is retired and can assist.  Dtr 2 yo currently staying with patient and can assist as well. Caregiver Availability: 24/7 Discharge Plan Discussed with Primary Caregiver: Yes Is Caregiver In Agreement with Plan?: Yes Does Caregiver/Family have Issues with Lodging/Transportation while Pt is in Rehab?: No  Goals/Additional Needs Patient/Family Goal for Rehab: PT supervision, OT supervision and mod I goals Expected length of stay: 10-13 days Cultural Considerations: None Dietary Needs: Regular diet, thin liquids Equipment Needs: TBD Pt/Family Agrees to Admission and willing to participate: Yes Program Orientation Provided & Reviewed with Pt/Caregiver Including Roles  & Responsibilities: Yes  Decrease burden of Care through IP rehab admission: N/A  Possible need for SNF placement upon discharge: Not planned  Patient Condition: This patient's condition remains as documented in the consult dated 12/20/16, in which the Rehabilitation Physician determined and  documented that the patient's condition is appropriate for intensive rehabilitative care in an inpatient rehabilitation facility. Will admit to inpatient rehab today.  Preadmission Screen Completed By:  [name], 12/21/2016 12:38 PM ______________________________________________________________________   Discussed status with Dr. Naaman Plummer on 12/21/16 at 1238 and received telephone approval for admission today.  Admission Coordinator:  [name], time 1238/Date 12/21/16

## 2016-12-21 NOTE — Care Management Important Message (Signed)
Important Message  Patient Details  Name: Kim Mcgee MRN: [id number] Date of Birth: 08/26/1940   Medicare Important Message Given:  Yes    Lovelee Forner Montine Circle 12/21/2016, 8:53 AM

## 2016-12-21 NOTE — Discharge Summary (Signed)
Physician Discharge Summary  Patient ID: Kim Mcgee MRN: [id number] DOB/AGE: Sep 04, 1940 76 y.o.  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 Possible lumbar epidural hematoma with instability T12-L1   Discharge Diagnoses: Severe lumbar spinal stenosis T12-L1 primarily from large synovial cyst and instability T12-L1  Discharged Condition: fair  Hospital Course: The patient was admitted on 12/15/2016 and taken to the operating room where the patient underwent decompressive thoracic laminectomy T12-L1 with removal of large synovial cyst and foraminotomies at T12 and L1 nerve roots,pedicle screw fixation T10, T11, T12 ,  removal of bilateral loose L1 screws and placement of pedicle screws at T10, T11, T12 bilaterally, posterior lateral arthrodesis T10-L2 . The patient tolerated the procedure well and was taken to the recovery room and then to the floor in stable condition. The hospital course was routine. There were no complications. The wound remained clean dry and intact. Pt had appropriate back soreness. No complaints of new pain or new N/T/W. The patient remained afebrile with stable vital signs, and tolerated a regular diet. The patient continued to increase activities, and pain was well controlled with oral pain medications.   Consults: None  Significant Diagnostic Studies:  Results for orders placed or performed during the hospital encounter of 55/73/22  Basic metabolic panel  Result Value Ref Range   Sodium 141 135 - 145 mmol/L   Potassium 4.0 3.5 - 5.1 mmol/L   Chloride 104 101 - 111 mmol/L   CO2 26 22 - 32 mmol/L   Glucose, Bld 93 65 - 99 mg/dL   BUN 13 6 - 20 mg/dL   Creatinine, Ser 0.67 0.44 - 1.00 mg/dL   Calcium 9.5 8.9 - 10.3 mg/dL   GFR calc non Af Amer >60 >60 mL/min   GFR calc Af Amer >60 >60 mL/min   Anion gap 11 5 - 15  CBC  Result Value Ref Range   WBC 11.5 (H) 4.0 -  10.5 K/uL   RBC 4.83 3.87 - 5.11 MIL/uL   Hemoglobin 14.1 12.0 - 15.0 g/dL   HCT 43.6 36.0 - 46.0 %   MCV 90.3 78.0 - 100.0 fL   MCH 29.2 26.0 - 34.0 pg   MCHC 32.3 30.0 - 36.0 g/dL   RDW 14.8 11.5 - 15.5 %   Platelets 225 150 - 400 K/uL  Protime-INR  Result Value Ref Range   Prothrombin Time 12.1 11.4 - 15.2 seconds   INR 0.25   Basic metabolic panel  Result Value Ref Range   Sodium 140 135 - 145 mmol/L   Potassium 4.5 3.5 - 5.1 mmol/L   Chloride 103 101 - 111 mmol/L   CO2 29 22 - 32 mmol/L   Glucose, Bld 180 (H) 65 - 99 mg/dL   BUN 10 6 - 20 mg/dL   Creatinine, Ser 0.69 0.44 - 1.00 mg/dL   Calcium 9.3 8.9 - 10.3 mg/dL   GFR calc non Af Amer >60 >60 mL/min   GFR calc Af Amer >60 >60 mL/min   Anion gap 8 5 - 15  Troponin I (q 6hr x 3)  Result Value Ref Range   Troponin I <0.03 <0.03 ng/mL  Troponin I (q 6hr x 3)  Result Value Ref Range   Troponin I <0.03 <0.03 ng/mL  Troponin I (q 6hr x 3)  Result Value Ref Range   Troponin I <0.03 <0.03 ng/mL  CBC with Differential/Platelet  Result Value Ref Range   WBC  14.5 (H) 4.0 - 10.5 K/uL   RBC 3.99 3.87 - 5.11 MIL/uL   Hemoglobin 11.6 (L) 12.0 - 15.0 g/dL   HCT 35.6 (L) 36.0 - 46.0 %   MCV 89.2 78.0 - 100.0 fL   MCH 29.1 26.0 - 34.0 pg   MCHC 32.6 30.0 - 36.0 g/dL   RDW 15.0 11.5 - 15.5 %   Platelets 209 150 - 400 K/uL   Neutrophils Relative % 73 %   Neutro Abs 10.6 (H) 1.7 - 7.7 K/uL   Lymphocytes Relative 18 %   Lymphs Abs 2.5 0.7 - 4.0 K/uL   Monocytes Relative 9 %   Monocytes Absolute 1.3 (H) 0.1 - 1.0 K/uL   Eosinophils Relative 0 %   Eosinophils Absolute 0.0 0.0 - 0.7 K/uL   Basophils Relative 0 %   Basophils Absolute 0.0 0.0 - 0.1 K/uL  Basic metabolic panel  Result Value Ref Range   Sodium 141 135 - 145 mmol/L   Potassium 4.4 3.5 - 5.1 mmol/L   Chloride 105 101 - 111 mmol/L   CO2 28 22 - 32 mmol/L   Glucose, Bld 134 (H) 65 - 99 mg/dL   BUN 13 6 - 20 mg/dL   Creatinine, Ser 0.66 0.44 - 1.00 mg/dL    Calcium 9.1 8.9 - 10.3 mg/dL   GFR calc non Af Amer >60 >60 mL/min   GFR calc Af Amer >60 >60 mL/min   Anion gap 8 5 - 15  Brain natriuretic peptide  Result Value Ref Range   B Natriuretic Peptide 119.1 (H) 0.0 - 100.0 pg/mL  CBC with Differential/Platelet  Result Value Ref Range   WBC 12.4 (H) 4.0 - 10.5 K/uL   RBC 4.12 3.87 - 5.11 MIL/uL   Hemoglobin 11.9 (L) 12.0 - 15.0 g/dL   HCT 36.4 36.0 - 46.0 %   MCV 88.3 78.0 - 100.0 fL   MCH 28.9 26.0 - 34.0 pg   MCHC 32.7 30.0 - 36.0 g/dL   RDW 14.4 11.5 - 15.5 %   Platelets 255 150 - 400 K/uL   Neutrophils Relative % 80 %   Neutro Abs 9.8 (H) 1.7 - 7.7 K/uL   Lymphocytes Relative 11 %   Lymphs Abs 1.4 0.7 - 4.0 K/uL   Monocytes Relative 9 %   Monocytes Absolute 1.1 (H) 0.1 - 1.0 K/uL   Eosinophils Relative 0 %   Eosinophils Absolute 0.0 0.0 - 0.7 K/uL   Basophils Relative 0 %   Basophils Absolute 0.0 0.0 - 0.1 K/uL  Comprehensive metabolic panel  Result Value Ref Range   Sodium 138 135 - 145 mmol/L   Potassium 4.1 3.5 - 5.1 mmol/L   Chloride 102 101 - 111 mmol/L   CO2 29 22 - 32 mmol/L   Glucose, Bld 171 (H) 65 - 99 mg/dL   BUN 20 6 - 20 mg/dL   Creatinine, Ser 0.63 0.44 - 1.00 mg/dL   Calcium 8.7 (L) 8.9 - 10.3 mg/dL   Total Protein 5.3 (L) 6.5 - 8.1 g/dL   Albumin 2.7 (L) 3.5 - 5.0 g/dL   AST 47 (H) 15 - 41 U/L   ALT 60 (H) 14 - 54 U/L   Alkaline Phosphatase 43 38 - 126 U/L   Total Bilirubin 0.7 0.3 - 1.2 mg/dL   GFR calc non Af Amer >60 >60 mL/min   GFR calc Af Amer >60 >60 mL/min   Anion gap 7 5 - 15  ECHOCARDIOGRAM COMPLETE  Result Value  Ref Range   Weight 2,624 oz   Height 62 in   BP 112/44 mmHg  Type and screen [place]  Result Value Ref Range   ABO/RH(D) A POS    Antibody Screen NEG    Sample Expiration 12/18/2016   Prepare Pheresed Platelets  Result Value Ref Range   Unit Number [id number]    Blood Component Type PLTP LR2 PAS    Unit division 00    Status of Unit ISSUED,FINAL     Transfusion Status OK TO TRANSFUSE   BPAM Platelet Pheresis  Result Value Ref Range   ISSUE DATE / TIME [id number]    Blood Product Unit Number [id number]    PRODUCT CODE [id number]    Unit Type and Rh 4315    Blood Product Expiration Date [id number]     Dg Chest 1 View  Result Date: 12/17/2016 CLINICAL DATA:  Shortness of breath.  Post back surgery / fusion. EXAM: CHEST 1 VIEW COMPARISON:  Chest CT - 02/09/2012 FINDINGS: Grossly unchanged cardiac silhouette and mediastinal contours. Minimal bibasilar opacities favored to represent atelectasis or scar. No discrete focal airspace opacities. No pleural effusion or pneumothorax. No evidence of edema. Post paraspinal lower thoracic / lumbar fusion and cervical ACDF, incompletely evaluated. IMPRESSION: Minimal bibasilar atelectasis without superimposed acute cardiopulmonary disease on this supine AP portable examination. Electronically Signed   By: Sandi Mariscal M.D.   On: 12/17/2016 10:19   Dg Thoracolumabar Spine  Result Date: 12/16/2016 CLINICAL DATA:  Laminectomy. EXAM: DG C-ARM 61-120 MIN; THORACOLUMBAR SPINE - 2 VIEW COMPARISON:  CT 12/15/2016. FINDINGS: Postsurgical changes noted of the thoracolumbar spine. Surgical retractors and sponge noted over the posterior back . Visualized hardware is intact. Anatomic alignment. IMPRESSION: Postsurgical changes thoracolumbar spine. Electronically Signed   By: Marcello Moores  Register   On: 12/16/2016 07:25   Dg Ankle Complete Right  Result Date: 12/15/2016 CLINICAL DATA:  Fall today.  Ankle pain EXAM: RIGHT ANKLE - COMPLETE 3+ VIEW COMPARISON:  None. FINDINGS: Normal alignment. Negative for fracture. Small joint effusion. Lateral soft tissue swelling. Large calcaneal spur on the plantar surface IMPRESSION: Small joint effusion.  Negative for fracture Electronically Signed   By: Franchot Gallo M.D.   On: 12/15/2016 18:49   Ct Thoracic Spine Wo Contrast  Result Date: 12/15/2016 CLINICAL DATA:  Lower  extremity weakness. History of spinal surgery. EXAM: CT THORACIC AND LUMBAR SPINE WITHOUT CONTRAST TECHNIQUE: Multidetector CT imaging of the thoracic and lumbar spine was performed without contrast. Multiplanar CT image reconstructions were also generated. COMPARISON:  Lumbar spine MRI 12/15/2016 FINDINGS: CT THORACIC SPINE FINDINGS Alignment: Normal. Vertebrae: There is flowing anterior osteophyte formation at all thoracic levels, worst at T2-T3. No acute fracture. There is posterior spinal fusion with intervertebral disc spaces extending from L1 and L5. There is mild lucency surrounding the L1 screws. No abnormal lucency at the other levels. Paraspinal and other soft tissues: Negative. Disc levels: At T1-T2, there is a large central posterior osteophyte that causes moderate spinal canal stenosis and indents the ventral spinal cord. There is otherwise no spinal canal stenosis. The facet hypertrophy and endplate spurring cause moderate to severe foraminal stenosis at the right T2-T6 levels. There are a few loculated foci of gas in the dorsal spinal canal, which are nonspecific. CT LUMBAR SPINE FINDINGS Segmentation: Normal Alignment: Grade 1 L4-L5 anterolisthesis. Vertebrae: Large osteophytes at nearly all levels. No acute compression fracture. Paraspinal and other soft tissues: Negative. Disc levels: The disc spaces are better evaluated  on the concomitant MRI. At the T12-L1 level, there is severe spinal canal stenosis, as previously demonstrated. There is gas within the disc space with a small amount of gas along the ventral aspect of the spinal canal. This may aid provided source for gas elsewhere in the spinal canal. The L2-L5 levels are decompressed posteriorly. There is a solid posterior fusion mass. There is anterior fusion at L1-L2 and L3-L5. Visualized sacrum: Normal. IMPRESSION: 1. 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 2. Mild  lucency surrounding both L1 transpedicular screws, greater on the right. This suggest a mild degree of loosening. 3. Moderate spinal canal stenosis at the T1-T2 level secondary to large posterior osteophyte. 4. Nonspecific pneumorrhachis, which is often detected incidentally and has numerous possible etiologies. In this case, the foci of gas in the spinal canal may have been released from disc vacuum phenomenon at the T12-L1 disc space. There is no associated compression of the thecal sac or other acute finding. 5. Moderate to severe narrowing of the right neural foramina at the T2-T6 levels. Electronically Signed   By: Ulyses Jarred M.D.   On: 12/15/2016 22:52   Ct Lumbar Spine Wo Contrast  Result Date: 12/15/2016 CLINICAL DATA:  Lower extremity weakness. History of spinal surgery. EXAM: CT THORACIC AND LUMBAR SPINE WITHOUT CONTRAST TECHNIQUE: Multidetector CT imaging of the thoracic and lumbar spine was performed without contrast. Multiplanar CT image reconstructions were also generated. COMPARISON:  Lumbar spine MRI 12/15/2016 FINDINGS: CT THORACIC SPINE FINDINGS Alignment: Normal. Vertebrae: There is flowing anterior osteophyte formation at all thoracic levels, worst at T2-T3. No acute fracture. There is posterior spinal fusion with intervertebral disc spaces extending from L1 and L5. There is mild lucency surrounding the L1 screws. No abnormal lucency at the other levels. Paraspinal and other soft tissues: Negative. Disc levels: At T1-T2, there is a large central posterior osteophyte that causes moderate spinal canal stenosis and indents the ventral spinal cord. There is otherwise no spinal canal stenosis. The facet hypertrophy and endplate spurring cause moderate to severe foraminal stenosis at the right T2-T6 levels. There are a few loculated foci of gas in the dorsal spinal canal, which are nonspecific. CT LUMBAR SPINE FINDINGS Segmentation: Normal Alignment: Grade 1 L4-L5 anterolisthesis. Vertebrae:  Large osteophytes at nearly all levels. No acute compression fracture. Paraspinal and other soft tissues: Negative. Disc levels: The disc spaces are better evaluated on the concomitant MRI. At the T12-L1 level, there is severe spinal canal stenosis, as previously demonstrated. There is gas within the disc space with a small amount of gas along the ventral aspect of the spinal canal. This may aid provided source for gas elsewhere in the spinal canal. The L2-L5 levels are decompressed posteriorly. There is a solid posterior fusion mass. There is anterior fusion at L1-L2 and L3-L5. Visualized sacrum: Normal. IMPRESSION: 1. 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 2. Mild lucency surrounding both L1 transpedicular screws, greater on the right. This suggest a mild degree of loosening. 3. Moderate spinal canal stenosis at the T1-T2 level secondary to large posterior osteophyte. 4. Nonspecific pneumorrhachis, which is often detected incidentally and has numerous possible etiologies. In this case, the foci of gas in the spinal canal may have been released from disc vacuum phenomenon at the T12-L1 disc space. There is no associated compression of the thecal sac or other acute finding. 5. Moderate  to severe narrowing of the right neural foramina at the T2-T6 levels. Electronically Signed   By: Ulyses Jarred M.D.   On: 12/15/2016 22:52   Mr Lumbar Spine Wo Contrast  Result Date: 12/15/2016 CLINICAL DATA:  Initial evaluation for acute on chronic back pain with left-sided foot drop, status post recent epidural steroid injection, now with increased pain and episode of legs giving out. EXAM: MRI LUMBAR SPINE WITHOUT CONTRAST TECHNIQUE: Multiplanar, multisequence MR imaging of the lumbar spine was performed. No intravenous contrast was administered. COMPARISON:  Comparison made with most recent MRI from 11/24/2016. FINDINGS: Segmentation: Normal  segmentation. Lowest well-formed disc is labeled the L5-S1 level. Same numbering system is employed as on previous exams. Alignment: Chronic 5 mm anterolisthesis of L4 on L5, stable from previous. Mild levoscoliosis. Vertebral bodies otherwise normally aligned with preservation of the normal lumbar lordosis. Vertebrae: Vertebral body heights are maintained. No evidence for acute or interval fracture. Extensive postsurgical changes from previous posterior fusion at L1 through L5, with previous interbody fusion at L1-2, L3-4, and L4-5. Reactive endplate changes noted about the T12-L1 interspace with prominent bulky anterior osteophytic spurring. Signal intensity within the vertebral body bone marrow is within normal limits. No discrete or worrisome osseous lesions. Conus medullaris: Extends to the L2 level and appears normal. Paraspinal and other soft tissues: Postsurgical changes with fatty atrophy present within the posterior paraspinous musculature. Similar postoperative fluid collection at the laminectomy defect extending from L2 through L5. Scattered parapelvic cyst noted within the kidneys bilaterally. Visualized visceral structures otherwise unremarkable. Disc levels: T10-11: Seen only on sagittal projection. Mild diffuse disc bulge. No canal or foraminal stenosis. T11-12: Seen only on sagittal projection. Mild disc bulge. No canal or foraminal stenosis. T12-L1: Diffuse degenerative disc bulge with disc desiccation. Reactive endplate changes. Broad posterior component flattens and largely effaces the ventral thecal sac. Superimposed moderate facet arthrosis with ligamentum flavum thickening. Small reactive effusions within the bilateral T12-L1 facets. There is a superimposed synovial cyst at the anteromedial aspect of the left T12-L1 facet measuring 5 mm (series 5, image 4). This appears slightly increased in size relative to most recent exam (previously 4 mm). Additional small synovial cyst slightly inferiorly  and just to the right of midline at the posterior thecal sac measures 4 mm (series 5, image 5). There is resultant severe canal stenosis with impingement on the distal spinal cord which is compressed and shifted to the right (series 5, image 4). Thecal sac measures 5 mm in AP diameter at its most narrow point (previously 7 mm on 11/24/2016). No associated cord signal changes. Moderate to advanced bilateral T12 foraminal narrowing due to disc bulge, endplate changes, and facet disease present as well. This is slightly worse on the left. L1-2: Postoperative changes from posterior and interbody fusion. Residual central disc extrusion with inferior migration (series 5, image 11). No canal or subarticular stenosis. Foramina appear patent. L2-3: Mild diffuse disc bulge with a marginal endplate osteophytes. Prior posterior decompression with fusion. No residual canal stenosis. Foramina appear patent. L3-4: Prior posterior and interbody fusion. No residual canal or foraminal stenosis. L4-5: Chronic 5 mm anterolisthesis. Prior posterior and interbody fusion. No residual canal stenosis. The L4 foramina are elongated but remain patent due to slippage, stable. L5-S1: Chronic degenerative intervertebral disc space narrowing with disc desiccation and diffuse disc bulge. Superimposed marginal endplate osteophytes. Disc osteophyte indents the ventral thecal sac, stable from previous. Resultant mild lateral recess stenosis without frank impingement of the descending S1 nerve roots. Moderate  facet arthrosis. Mild left L5 foraminal narrowing. IMPRESSION: 1. Multifactorial degenerative changes with resultant severe canal stenosis and cord compression at the T12-L1 level as detailed above, slightly worsened as compared to recent MRI from 11/24/2016. No cord signal changes. Neuro surgical consultation recommended. 2. Extensive postoperative changes from previous posterior and interbody fusion at L1 through L5 as above, otherwise stable  in appearance, with no new stenosis identified. Results were called by telephone at the time of interpretation on 12/15/2016 at 8:35 pm to Dr. Davonna Belling , who verbally acknowledged these results. Electronically Signed   By: Jeannine Boga M.D.   On: 12/15/2016 20:38   Dg C-arm 1-60 Min  Result Date: 12/16/2016 CLINICAL DATA:  Laminectomy. EXAM: DG C-ARM 61-120 MIN; THORACOLUMBAR SPINE - 2 VIEW COMPARISON:  CT 12/15/2016. FINDINGS: Postsurgical changes noted of the thoracolumbar spine. Surgical retractors and sponge noted over the posterior back . Visualized hardware is intact. Anatomic alignment. IMPRESSION: Postsurgical changes thoracolumbar spine. Electronically Signed   By: Marcello Moores  Register   On: 12/16/2016 07:25    Antibiotics:  Anti-infectives    Start     Dose/Rate Route Frequency Ordered Stop   12/16/16 1000  vancomycin (VANCOCIN) IVPB 750 mg/150 ml premix     750 mg 150 mL/hr over 60 Minutes Intravenous Every 12 hours 12/16/16 0411     12/16/16 0401  cephALEXin (KEFLEX) capsule 500 mg  Status:  Discontinued    Comments:  DAY BEFORE/DAY OF/DAY AFTER DENTAL APPOINTMENTS     500 mg Oral See admin instructions 12/16/16 0401 12/16/16 0410   12/16/16 0219  vancomycin (VANCOCIN) powder  Status:  Discontinued       As needed 12/16/16 0220 12/16/16 0247   12/16/16 0000  gentamicin (GARAMYCIN) 420 mg in dextrose 5 % 100 mL IVPB     420 mg 110.5 mL/hr over 60 Minutes Intravenous  Once 12/16/16 0000 12/16/16 0023   12/15/16 2259  bacitracin 50,000 Units in sodium chloride irrigation 0.9 % 500 mL irrigation  Status:  Discontinued       As needed 12/15/16 2300 12/16/16 0247      Discharge Exam: Blood pressure (!) 146/51, pulse (!) 59, temperature 98.2 F (36.8 C), temperature source Oral, resp. rate 18, height 5\' 2"  (1.575 m), weight 74.4 kg (164 lb), SpO2 97 %. Neurologic: Grossly normal Residual left lower extremity weakness.   Discharge Medications:   Allergies as of  12/21/2016      Reactions   Macrodantin [nitrofurantoin] Other (See Comments)   Chest pain   Morphine And Related Nausea Only   CAUSED THE DRY HEAVES; CANNOT TAKE AGAIN!!   Penicillins Rash, Other (See Comments)   Chest pain, also Has patient had a PCN reaction causing immediate rash, facial/tongue/throat swelling, SOB or lightheadedness with hypotension: Yes Has patient had a PCN reaction causing severe rash involving mucus membranes or skin necrosis: No Has patient had a PCN reaction that required hospitalization: No Has patient had a PCN reaction occurring within the last 10 years: Yes If all of the above answers are "NO", then may proceed with Cephalosporin use.   Tape Rash   No adhesive tape, but COBAN WRAP IS TOLERATED      Medication List    STOP taking these medications   cephALEXin 500 MG capsule Commonly known as:  KEFLEX   clopidogrel 75 MG tablet Commonly known as:  PLAVIX   methocarbamol 500 MG tablet Commonly known as:  ROBAXIN   oxyCODONE-acetaminophen 5-325 MG tablet Commonly known  as:  PERCOCET/ROXICET   traMADol 50 MG tablet Commonly known as:  ULTRAM     TAKE these medications   acetaminophen 325 MG tablet Commonly known as:  TYLENOL Take 325-650 mg by mouth every 6 (six) hours as needed (for pain or headaches).   albuterol 108 (90 Base) MCG/ACT inhaler Commonly known as:  PROVENTIL HFA;VENTOLIN HFA Inhale 2 puffs into the lungs every 6 (six) hours as needed for shortness of breath.   bethanechol 25 MG tablet Commonly known as:  URECHOLINE Take 1 tablet (25 mg total) by mouth 3 (three) times daily.   cyclobenzaprine 10 MG tablet Commonly known as:  FLEXERIL Take 1 tablet (10 mg total) by mouth 3 (three) times daily as needed for muscle spasms. What changed:  additional instructions   dexamethasone 4 MG tablet Commonly known as:  DECADRON Take 1 tablet (4 mg total) by mouth 2 (two) times daily.   fexofenadine 180 MG tablet Commonly known as:   ALLEGRA Take 180 mg by mouth daily as needed for allergies.   FISH OIL PO Take 1 capsule by mouth daily.   Fluticasone-Salmeterol 500-50 MCG/DOSE Aepb Commonly known as:  ADVAIR Inhale 1 puff into the lungs 2 (two) times daily as needed (for seasonal "flares").   gabapentin 300 MG capsule Commonly known as:  NEURONTIN Take 300 mg by mouth 3 (three) times daily.   HYDROcodone-acetaminophen 5-325 MG tablet Commonly known as:  NORCO/VICODIN Take 1 tablet by mouth every 4 (four) hours as needed (for pain).   levothyroxine 112 MCG tablet Commonly known as:  SYNTHROID, LEVOTHROID Take 112 mcg by mouth daily before breakfast.   metoprolol succinate 50 MG 24 hr tablet Commonly known as:  TOPROL-XL Take 50 mg by mouth 2 (two) times daily.   nitroGLYCERIN 0.4 MG SL tablet Commonly known as:  NITROSTAT Place 0.4 mg under the tongue every 5 (five) minutes x 3 doses as needed for chest pain.   ondansetron 4 MG disintegrating tablet Commonly known as:  ZOFRAN ODT Take 1 tablet (4 mg total) by mouth every 8 (eight) hours as needed for nausea.   oxazepam 15 MG capsule Commonly known as:  SERAX Take 15 mg by mouth at bedtime as needed for anxiety.   pantoprazole 40 MG tablet Commonly known as:  PROTONIX Take 40 mg by mouth daily before breakfast.   polyethylene glycol packet Commonly known as:  MIRALAX / GLYCOLAX Take 17 g by mouth daily.   pregabalin 75 MG capsule Commonly known as:  LYRICA Take 1 capsule (75 mg total) by mouth 2 (two) times daily.   rosuvastatin 5 MG tablet Commonly known as:  CRESTOR Take 5 mg by mouth at bedtime.   tamsulosin 0.4 MG Caps capsule Commonly known as:  FLOMAX Take 1 capsule (0.4 mg total) by mouth daily.       Disposition: inpatient rehab   Final Dx: same as discharge diagnosis  Discharge Instructions    Call MD for:    Complete by:  As directed    Call MD for:  difficulty breathing, headache or visual disturbances    Complete by:   As directed    Call MD for:  extreme fatigue    Complete by:  As directed    Call MD for:  hives    Complete by:  As directed    Call MD for:  persistant dizziness or light-headedness    Complete by:  As directed    Call MD for:  persistant nausea  and vomiting    Complete by:  As directed    Call MD for:  redness, tenderness, or signs of infection (pain, swelling, redness, odor or green/yellow discharge around incision site)    Complete by:  As directed    Call MD for:  severe uncontrolled pain    Complete by:  As directed    Call MD for:  temperature >100.4    Complete by:  As directed    Diet - low sodium heart healthy    Complete by:  As directed    Driving Restrictions    Complete by:  As directed    No driving until follow up appointment with Dr. Ellene Route   Increase activity slowly    Complete by:  As directed          Signed: Ocie Cornfield Carlyn Mullenbach 12/21/2016, 12:40 PM

## 2016-12-21 NOTE — Progress Notes (Signed)
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  Referring Physician: Dr. Saintclair Halsted   HPI: Kim Mcgee is a 76 y.o. female with history of CAD, SOB, HTN, laryngospasm's, progressive back pain radiating to BLE and LLE weakness for 3-4 weeks with left foot drop who was treated with [facility] 12/13/16. She was admitted on 7/18 with increase in pain and fall due to BLE giving away. MRI spine done revealing multifactorial degenerative changes with severe canal stenosis T 12-L1  and cord compression--slightly worse since 6/27 MRI. She was taken to OR on 7/19 for decompressive laminectomy T12-L1 with removal of large synovial cyst and decompression of T12 and L1 nerve roots with removal of loose L1 screws and arthrodesis as well as repair of inadvertent dural tear by Dr. Saintclair Halsted. Post op on bedrest till 7/22. She has had issues with confusion and progressive dyspnea without hypoxia--IS recommended.  Therapy evaluations done and CIR recommended for follow up therapy. Was independent without AD till about 4-5 weeks ago. Was getting week and using walker 2 weeks PTA. She was able to sit up for 60 minutes yesterday and has been getting up to [facility] with assistance.   Review of Systems  HENT: Negative for hearing loss and tinnitus.   Eyes: Negative for blurred vision and double vision.  Respiratory: Negative for cough and hemoptysis.   Cardiovascular: Negative for chest pain.  Gastrointestinal: Positive for heartburn. Negative for constipation and vomiting.  Genitourinary: Negative for  dysuria and urgency.  Musculoskeletal: Positive for joint pain (right hip pain) and myalgias.  Skin: Negative for itching and rash.  Neurological: Positive for dizziness (with mobility), sensory change (feels like she's standing on concrete balls.  LLE numbness. ), focal weakness and headaches (on and off).  Psychiatric/Behavioral: Negative for memory loss. The patient is nervous/anxious. The patient does not have insomnia.   All other systems reviewed and are negative.        Past Medical History:  Diagnosis Date  . Anxiety   . Arthritis    lumbar DDD  . Asthma   . Basal cell carcinoma of nose   . Chronic lower back pain   . Coronary artery disease   . Dysrhythmia   . GERD (gastroesophageal reflux disease)   . High cholesterol   . Hypertension   . Hypothyroidism   . Normal cardiac stress test   . PONV (postoperative nausea and vomiting)   . Shortness of breath    /w walking long distance   . Swallowing difficulty    being seen by ENT- had endoscopy- told that its probably related to past neck surgeries          Past Surgical History:  Procedure Laterality Date  . ANTERIOR CERVICAL DECOMP/DISCECTOMY FUSION  2010  . APPENDECTOMY    . BACK SURGERY    . BASAL CELL CARCINOMA EXCISION     "cut off nose"  . CARDIAC CATHETERIZATION  1970s; 1990s?; 2017  . CATARACT EXTRACTION W/ INTRAOCULAR LENS  IMPLANT, BILATERAL Bilateral   . CORONARY ANGIOPLASTY WITH STENT PLACEMENT  ~ 2017  . DECOMPRESSIVE LUMBAR  LAMINECTOMY LEVEL 1 N/A 12/15/2016   Procedure: DECOMPRESSIVE  LAMINECTOMY Thoracic twelve to lumbar one;  Surgeon: Kary Kos, MD;  Location: [place];  Service: Neurosurgery;  Laterality: N/A;  . DILATION AND CURETTAGE OF UTERUS    . OOPHORECTOMY     "?side"  . POSTERIOR FUSION LUMBAR SPINE  ~ 2013; 2016  . POSTERIOR LUMBAR FUSION 4 LEVEL N/A 12/15/2016   Procedure: Posterior Fusion Thoracic- ten to Lumbar- two;  Surgeon: Kary Kos, MD;   Location: [place];  Service: Neurosurgery;  Laterality: N/A;  . TONSILLECTOMY    . TOTAL THYROIDECTOMY  1990's  . TUBAL LIGATION      Family History  Problem Relation Age of Onset  . Cancer - Colon Mother   . Heart disease Father   . Bone cancer Brother   . Heart disease Brother     Social History:  Married. Independent prior to 4-5 weeks ago. She  reports that she has never smoked. She has never used smokeless tobacco. She reports that she does not drink alcohol or use drugs.         Allergies  Allergen Reactions  . Macrodantin [Nitrofurantoin] Other (See Comments)    Chest pain  . Morphine And Related Nausea Only    CAUSED THE DRY HEAVES; CANNOT TAKE AGAIN!!  . Penicillins Rash and Other (See Comments)    Chest pain, also Has patient had a PCN reaction causing immediate rash, facial/tongue/throat swelling, SOB or lightheadedness with hypotension: Yes Has patient had a PCN reaction causing severe rash involving mucus membranes or skin necrosis: No Has patient had a PCN reaction that required hospitalization: No Has patient had a PCN reaction occurring within the last 10 years: Yes If all of the above answers are "NO", then may proceed with Cephalosporin use.   . Tape Rash    No adhesive tape, but COBAN WRAP IS TOLERATED          Medications Prior to Admission  Medication Sig Dispense Refill  . acetaminophen (TYLENOL) 325 MG tablet Take 325-650 mg by mouth every 6 (six) hours as needed (for pain or headaches).     Marland Kitchen albuterol (PROVENTIL HFA;VENTOLIN HFA) 108 (90 BASE) MCG/ACT inhaler Inhale 2 puffs into the lungs every 6 (six) hours as needed for shortness of breath.     . cephALEXin (KEFLEX) 500 MG capsule Take 500 mg by mouth See admin instructions. DAY BEFORE/DAY OF/DAY AFTER DENTAL APPOINTMENTS  50  . clopidogrel (PLAVIX) 75 MG tablet Take 75 mg by mouth daily.  3  . cyclobenzaprine (FLEXERIL) 10 MG tablet Take 1 tablet (10 mg total) by mouth  3 (three) times daily as needed for muscle spasms. For pain (Patient taking differently: Take 10 mg by mouth 3 (three) times daily as needed (for muscle spasms or pain). ) 50 tablet 3  . fexofenadine (ALLEGRA) 180 MG tablet Take 180 mg by mouth daily as needed for allergies.    . Fluticasone-Salmeterol (ADVAIR) 500-50 MCG/DOSE AEPB Inhale 1 puff into the lungs 2 (two) times daily as needed (for seasonal "flares").     . gabapentin (NEURONTIN) 300 MG capsule Take 300 mg by mouth 3 (three) times daily.  0  . HYDROcodone-acetaminophen (NORCO/VICODIN) 5-325 MG tablet Take 1 tablet by mouth every 4 (four) hours as needed. (Patient taking differently: Take 1 tablet by mouth every 4 (four) hours as needed (for pain). ) 20 tablet 0  . levothyroxine (SYNTHROID, LEVOTHROID) 112 MCG tablet Take 112 mcg by mouth daily  before breakfast.    . methocarbamol (ROBAXIN) 500 MG tablet Take 1 tablet (500 mg total) by mouth 3 (three) times daily between meals as needed. (Patient taking differently: Take 500 mg by mouth 3 (three) times daily as needed for muscle spasms. ) 20 tablet 0  . metoprolol succinate (TOPROL-XL) 50 MG 24 hr tablet Take 50 mg by mouth 2 (two) times daily.  2  . nitroGLYCERIN (NITROSTAT) 0.4 MG SL tablet Place 0.4 mg under the tongue every 5 (five) minutes x 3 doses as needed for chest pain.    . Omega-3 Fatty Acids (FISH OIL PO) Take 1 capsule by mouth daily.    . ondansetron (ZOFRAN ODT) 4 MG disintegrating tablet Take 1 tablet (4 mg total) by mouth every 8 (eight) hours as needed for nausea. 6 tablet 0  . oxazepam (SERAX) 15 MG capsule Take 15 mg by mouth at bedtime as needed for anxiety.   0  . pantoprazole (PROTONIX) 40 MG tablet Take 40 mg by mouth daily before breakfast.  3  . polyethylene glycol (MIRALAX / GLYCOLAX) packet Take 17 g by mouth daily. 14 each   . rosuvastatin (CRESTOR) 5 MG tablet Take 5 mg by mouth at bedtime.   1  . Tamsulosin HCl (FLOMAX) 0.4 MG CAPS Take 1  capsule (0.4 mg total) by mouth daily. 30 capsule   . traMADol (ULTRAM) 50 MG tablet Take 50 mg by mouth every 6 (six) hours as needed for pain.  0  . bethanechol (URECHOLINE) 25 MG tablet Take 1 tablet (25 mg total) by mouth 3 (three) times daily. (Patient not taking: Reported on 12/15/2016)    . dexamethasone (DECADRON) 4 MG tablet Take 1 tablet (4 mg total) by mouth 2 (two) times daily. (Patient not taking: Reported on 12/15/2016) 10 tablet 0  . oxyCODONE-acetaminophen (PERCOCET/ROXICET) 5-325 MG per tablet Take 1-2 tablets by mouth every 4 (four) hours as needed. (Patient not taking: Reported on 12/15/2016) 61 tablet 0  . oxyCODONE-acetaminophen (PERCOCET/ROXICET) 5-325 MG per tablet Take 1-2 tablets by mouth every 4 (four) hours as needed for moderate pain. (Patient not taking: Reported on 12/15/2016) 60 tablet 0  . oxyCODONE-acetaminophen (PERCOCET/ROXICET) 5-325 MG per tablet Take 1-2 tablets by mouth every 6 (six) hours as needed for moderate pain. (Patient not taking: Reported on 12/15/2016) 80 tablet 0  . pregabalin (LYRICA) 75 MG capsule Take 1 capsule (75 mg total) by mouth 2 (two) times daily. (Patient not taking: Reported on 12/15/2016)      Home: Home Living Family/patient expects to be discharged to:: Private residence Living Arrangements: Spouse/significant other Available Help at Discharge: Family, Available 24 hours/day Type of Home: House Home Access: Stairs to enter [employer] of Steps: 4 Home Layout: One level Bathroom Shower/Tub: Multimedia programmer: [place]: Environmental consultant - 2 wheels, [employer] - single point, Wheelchair - manual, Bedside commode, Shower seat, Adaptive equipment  Functional History: Prior Function Level of Independence: Independent Comments: Several previous back surgeries Functional Status:  Mobility: Bed Mobility Overal bed mobility: Needs Assistance Bed Mobility: Rolling, Sidelying to Sit Rolling: Min  assist Sidelying to sit: Min assist General bed mobility comments: cues for sequence with assist to roll, rise from side and reciprocally scoot with pad to EOB Transfers Overall transfer level: Needs assistance Transfers: Sit to/from Stand Sit to Stand: Min assist General transfer comment: cues for hand placement with assist to rise Ambulation/Gait Ambulation/Gait assistance: Min assist Ambulation Distance (Feet): 2 Feet Assistive device: Rolling walker (2  wheeled) Gait Pattern/deviations: Shuffle General Gait Details: initial assist for weight shifting and advancing legs, after the first 3 steps pt abl e to advance feet without full foot clearance, sliding foot, assist for balance with RW and stepping to chair Gait velocity interpretation: Below normal speed for age/gender  ADL: ADL Overall ADL's : Needs assistance/impaired Eating/Feeding: Set up, Sitting Grooming: Set up, Sitting Upper Body Bathing: Minimal assistance, Sitting Lower Body Bathing: Maximal assistance, Sit to/from stand Upper Body Dressing : Minimal assistance, Sitting Lower Body Dressing: Maximal assistance, Sit to/from stand Functional mobility during ADLs: Minimal assistance, Rolling walker General ADL Comments: Provided education on back precautions and log roll technique for bed mobility. Pt's first time OOB since surgery. Pt performed sit<>Stand with RW and Min A and took 4 sie steps to sit higher in bed.  Will need further education and OT for LB ADLs with AE  Cognition: Cognition Overall Cognitive Status: Within Functional Limits for tasks assessed Orientation Level: Oriented X4 Cognition Arousal/Alertness: Awake/alert Behavior During Therapy: WFL for tasks assessed/performed Overall Cognitive Status: Within Functional Limits for tasks assessed  Blood pressure (!) 135/58, pulse 70, temperature 98.4 F (36.9 C), temperature source Oral, resp. rate 18, height 5\' 2"  (1.575 m), weight 74.4 kg (164 lb),  SpO2 94 %. Physical Exam  Nursing note and vitals reviewed. Constitutional: She is oriented to person, place, and time. She appears well-developed and well-nourished.  HENT:  Head: Normocephalic.  Mouth/Throat: Oropharynx is clear and moist.  Facial abrasions  Eyes: Pupils are equal, round, and reactive to light. Conjunctivae and EOM are normal.  Neck: Normal range of motion. Neck supple.  Cardiovascular: Normal rate and regular rhythm.   Respiratory: Effort normal and breath sounds normal. No stridor.  GI: Soft. Bowel sounds are normal. She exhibits no distension. There is no tenderness.  Musculoskeletal: She exhibits no edema or tenderness.  Neurological: She is alert and oriented to person, place, and time. She displays abnormal reflex. Coordination abnormal.  Speech clear.  Follows basic commands without difficulty.  Motor: B/l UE 5/5 proximal to distal RLE: HF, KE 4-/5, ADF/PF 4/5 LLE: HF, KE 2/5, ADF/PF 3-/5 Sensation diminished to light touch LLE>RLE DTRs symmetric  Skin: Skin is warm and dry. No erythema.  Psychiatric: Her behavior is normal. Judgment and thought content normal. Her mood appears anxious.    Lab Results Last 24 Hours       Results for orders placed or performed during the hospital encounter of 12/15/16 (from the past 24 hour(s))  CBC with Differential/Platelet     Status: Abnormal   Collection Time: 12/19/16  2:15 PM  Result Value Ref Range   WBC 12.4 (H) 4.0 - 10.5 K/uL   RBC 4.12 3.87 - 5.11 MIL/uL   Hemoglobin 11.9 (L) 12.0 - 15.0 g/dL   HCT 36.4 36.0 - 46.0 %   MCV 88.3 78.0 - 100.0 fL   MCH 28.9 26.0 - 34.0 pg   MCHC 32.7 30.0 - 36.0 g/dL   RDW 14.4 11.5 - 15.5 %   Platelets 255 150 - 400 K/uL   Neutrophils Relative % 80 %   Neutro Abs 9.8 (H) 1.7 - 7.7 K/uL   Lymphocytes Relative 11 %   Lymphs Abs 1.4 0.7 - 4.0 K/uL   Monocytes Relative 9 %   Monocytes Absolute 1.1 (H) 0.1 - 1.0 K/uL   Eosinophils Relative 0 %    Eosinophils Absolute 0.0 0.0 - 0.7 K/uL   Basophils Relative 0 %  Basophils Absolute 0.0 0.0 - 0.1 K/uL  Comprehensive metabolic panel     Status: Abnormal   Collection Time: 12/19/16  2:15 PM  Result Value Ref Range   Sodium 138 135 - 145 mmol/L   Potassium 4.1 3.5 - 5.1 mmol/L   Chloride 102 101 - 111 mmol/L   CO2 29 22 - 32 mmol/L   Glucose, Bld 171 (H) 65 - 99 mg/dL   BUN 20 6 - 20 mg/dL   Creatinine, Ser 0.63 0.44 - 1.00 mg/dL   Calcium 8.7 (L) 8.9 - 10.3 mg/dL   Total Protein 5.3 (L) 6.5 - 8.1 g/dL   Albumin 2.7 (L) 3.5 - 5.0 g/dL   AST 47 (H) 15 - 41 U/L   ALT 60 (H) 14 - 54 U/L   Alkaline Phosphatase 43 38 - 126 U/L   Total Bilirubin 0.7 0.3 - 1.2 mg/dL   GFR calc non Af Amer >60 >60 mL/min   GFR calc Af Amer >60 >60 mL/min   Anion gap 7 5 - 15     Imaging Results (Last 48 hours)  No results found.    Assessment/Plan: Diagnosis: Lumbar myelopathy Labs independently reviewed.  Records reviewed and summated above.  1. Does the need for close, 24 hr/day medical supervision in concert with the patient's rehab needs make it unreasonable for this patient to be served in a less intensive setting? Yes  2. Co-Morbidities requiring supervision/potential complications: confusion and progressive dyspnea, CAD, SOB, HTN (monitor and provide prns in accordance with increased physical exertion and pain), laryngospasm's, neuropathic pain (cont meds), steroid induced hyperglycemia (Monitor in accordance with exercise and adjust meds as necessary), post-op pain (Biofeedback training with therapies to help reduce reliance on opiate pain medications, monitor pain control during therapies, and sedation at rest and titrate to maximum efficacy to ensure participation and gains in therapies), leukocytosis (cont to monitor for signs and symptoms of infection, further workup if indicated), wean IV Vanc when appropriate 3. Due to bladder management, bowel management, safety,  skin/wound care, disease management, pain management and patient education, does the patient require 24 hr/day rehab nursing? Yes 4. Does the patient require coordinated care of a physician, rehab nurse, PT (1-2 hrs/day, 5 days/week) and OT (1-2 hrs/day, 5 days/week) to address physical and functional deficits in the context of the above medical diagnosis(es)? Yes Addressing deficits in the following areas: balance, endurance, locomotion, strength, transferring, bathing, dressing, toileting and psychosocial support 5. Can the patient actively participate in an intensive therapy program of at least 3 hrs of therapy per day at least 5 days per week? In the near future 6. The potential for patient to make measurable gains while on inpatient rehab is excellent 7. Anticipated functional outcomes upon discharge from inpatient rehab are supervision  with PT, modified independent and supervision with OT, n/a with SLP. 8. Estimated rehab length of stay to reach the above functional goals is: 10-13 days. 9. Anticipated D/C setting: Home 10. Anticipated post D/C treatments: HH therapy and Home excercise program 11. Overall Rehab/Functional Prognosis: good  RECOMMENDATIONS: This patient's condition is appropriate for continued rehabilitative care in the following setting: CIR, likely tomorrow as activity tolerance improves, pending bed availability. Patient has agreed to participate in recommended program. Yes Note that insurance prior authorization may be required for reimbursement for recommended care.  Comment: Rehab Admissions Coordinator to follow up.  Delice Lesch, MD, [place], [place] 12/20/2016    Revision History  Routing History

## 2016-12-21 NOTE — H&P (Signed)
Physical Medicine and Rehabilitation Admission H&P       Chief Complaint  Patient presents with  . Back pain with radiculopathy due to lumbar stenosis and cord compression.     HPI:   Kim Mcgee a 76 y.o.femalewith history of CAD, SOB, HTN, laryngospasm's, chronic dysphagia, progressive back pain radiating to BLE and LLE weakness for 3-4 weeks with left foot drop who was treated with [facility] 12/13/16. She was admitted on 7/18 with increase in pain and fall due to BLE giving away. 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 She was taken to OR on 7/19 for decompressive laminectomy T12-L1 with removal of large synovial cyst and decompression of T12 and L1 nerve roots with removal of loose L1 screws and arthrodesis as well as repair of inadvertent dural tear by Dr. Saintclair Halsted. Post op on bedrest till 7/22. She has had issues with confusion and progressive dyspnea without hypoxia--IS recommended. pain control improving and she was started on decadron taper. Therapy ongoing and patient with significant deficits in mobility and ADL tasks. CIR was  recommended for follow up therapy   Review of Systems  HENT: Negative for hearing loss and tinnitus.   Eyes: Negative for blurred vision and double vision.  Respiratory: Positive for shortness of breath. Negative for cough and wheezing.   Cardiovascular: Negative for chest pain and palpitations.  Gastrointestinal: Negative for constipation, heartburn and nausea.  Genitourinary: Negative for dysuria and urgency.       Had incontinence prior to surgery  Musculoskeletal: Positive for back pain, joint pain (right hip pain) and myalgias.  Skin: Negative for itching and rash.  Neurological: Positive for dizziness, sensory change, focal weakness, weakness and headaches.         Past Medical History:  Diagnosis Date  . Anxiety   . Arthritis     lumbar DDD  . Asthma   . Basal cell carcinoma of nose   . Chronic lower back pain   . Coronary artery disease   . Dysrhythmia   . GERD (gastroesophageal reflux disease)   . High cholesterol   . Hypertension   . Hypothyroidism   . Normal cardiac stress test   . PONV (postoperative nausea and vomiting)   . Shortness of breath    /w walking long distance   . Swallowing difficulty    being seen by ENT- had endoscopy- told that its probably related to past neck surgeries          Past Surgical History:  Procedure Laterality Date  . ANTERIOR CERVICAL DECOMP/DISCECTOMY FUSION  2010  . APPENDECTOMY    . BACK SURGERY    . BASAL CELL CARCINOMA EXCISION     "cut off nose"  . CARDIAC CATHETERIZATION  1970s; 1990s?; 2017  . CATARACT EXTRACTION W/ INTRAOCULAR LENS  IMPLANT, BILATERAL Bilateral   . CORONARY ANGIOPLASTY WITH STENT PLACEMENT  ~ 2017  . DECOMPRESSIVE LUMBAR LAMINECTOMY LEVEL 1 N/A 12/15/2016   Procedure: DECOMPRESSIVE  LAMINECTOMY Thoracic twelve to lumbar one;  Surgeon: Kary Kos, MD;  Location: [place];  Service: Neurosurgery;  Laterality: N/A;  . DILATION AND CURETTAGE OF UTERUS    . OOPHORECTOMY     "?side"  . POSTERIOR FUSION LUMBAR SPINE  ~ 2013; 2016  . POSTERIOR LUMBAR FUSION 4 LEVEL N/A 12/15/2016   Procedure: Posterior Fusion Thoracic- ten to Lumbar- two;  Surgeon: Kary Kos, MD;  Location: [place];  Service: Neurosurgery;  Laterality: N/A;  . TONSILLECTOMY    . TOTAL THYROIDECTOMY  1990's  . TUBAL LIGATION           Family History  Problem Relation Age of Onset  . Cancer - Colon Mother   . Heart disease Father   . Bone cancer Brother   . Heart disease Brother     Social History:  Married. Independent prior to 4-5 weeks ago. Retired--worked as Engineer, production. She reports that she has never smoked. She has never used smokeless tobacco. She reports that she does not drink alcohol or use drugs.         Allergies   Allergen Reactions  . Macrodantin [Nitrofurantoin] Other (See Comments)    Chest pain  . Morphine And Related Nausea Only    CAUSED THE DRY HEAVES; CANNOT TAKE AGAIN!!  . Penicillins Rash and Other (See Comments)    Chest pain, also Has patient had a PCN reaction causing immediate rash, facial/tongue/throat swelling, SOB or lightheadedness with hypotension: Yes Has patient had a PCN reaction causing severe rash involving mucus membranes or skin necrosis: No Has patient had a PCN reaction that required hospitalization: No Has patient had a PCN reaction occurring within the last 10 years: Yes If all of the above answers are "NO", then may proceed with Cephalosporin use.   . Tape Rash    No adhesive tape, but COBAN WRAP IS TOLERATED           Medications Prior to Admission  Medication Sig Dispense Refill  . acetaminophen (TYLENOL) 325 MG tablet Take 325-650 mg by mouth every 6 (six) hours as needed (for pain or headaches).     Marland Kitchen albuterol (PROVENTIL HFA;VENTOLIN HFA) 108 (90 BASE) MCG/ACT inhaler Inhale 2 puffs into the lungs every 6 (six) hours as needed for shortness of breath.     . cephALEXin (KEFLEX) 500 MG capsule Take 500 mg by mouth See admin instructions. DAY BEFORE/DAY OF/DAY AFTER DENTAL APPOINTMENTS  50  . clopidogrel (PLAVIX) 75 MG tablet Take 75 mg by mouth daily.  3  . cyclobenzaprine (FLEXERIL) 10 MG tablet Take 1 tablet (10 mg total) by mouth 3 (three) times daily as needed for muscle spasms. For pain (Patient taking differently: Take 10 mg by mouth 3 (three) times daily as needed (for muscle spasms or pain). ) 50 tablet 3  . fexofenadine (ALLEGRA) 180 MG tablet Take 180 mg by mouth daily as needed for allergies.    . Fluticasone-Salmeterol (ADVAIR) 500-50 MCG/DOSE AEPB Inhale 1 puff into the lungs 2 (two) times daily as needed (for seasonal "flares").     . gabapentin (NEURONTIN) 300 MG capsule Take 300 mg by mouth 3 (three) times daily.  0  .  HYDROcodone-acetaminophen (NORCO/VICODIN) 5-325 MG tablet Take 1 tablet by mouth every 4 (four) hours as needed. (Patient taking differently: Take 1 tablet by mouth every 4 (four) hours as needed (for pain). ) 20 tablet 0  . levothyroxine (SYNTHROID, LEVOTHROID) 112 MCG tablet Take 112 mcg by mouth daily before breakfast.    . methocarbamol (ROBAXIN) 500 MG tablet Take 1 tablet (500 mg total) by mouth 3 (three) times daily between meals as needed. (Patient taking differently: Take 500 mg by mouth 3 (three) times daily as needed for muscle spasms. ) 20 tablet 0  . metoprolol succinate (TOPROL-XL) 50 MG 24 hr tablet Take 50 mg by mouth 2 (two) times daily.  2  . nitroGLYCERIN (NITROSTAT) 0.4 MG SL tablet Place 0.4  mg under the tongue every 5 (five) minutes x 3 doses as needed for chest pain.    . Omega-3 Fatty Acids (FISH OIL PO) Take 1 capsule by mouth daily.    . ondansetron (ZOFRAN ODT) 4 MG disintegrating tablet Take 1 tablet (4 mg total) by mouth every 8 (eight) hours as needed for nausea. 6 tablet 0  . oxazepam (SERAX) 15 MG capsule Take 15 mg by mouth at bedtime as needed for anxiety.   0  . pantoprazole (PROTONIX) 40 MG tablet Take 40 mg by mouth daily before breakfast.  3  . polyethylene glycol (MIRALAX / GLYCOLAX) packet Take 17 g by mouth daily. 14 each   . rosuvastatin (CRESTOR) 5 MG tablet Take 5 mg by mouth at bedtime.   1  . Tamsulosin HCl (FLOMAX) 0.4 MG CAPS Take 1 capsule (0.4 mg total) by mouth daily. 30 capsule   . traMADol (ULTRAM) 50 MG tablet Take 50 mg by mouth every 6 (six) hours as needed for pain.  0  . bethanechol (URECHOLINE) 25 MG tablet Take 1 tablet (25 mg total) by mouth 3 (three) times daily. (Patient not taking: Reported on 12/15/2016)    . dexamethasone (DECADRON) 4 MG tablet Take 1 tablet (4 mg total) by mouth 2 (two) times daily. (Patient not taking: Reported on 12/15/2016) 10 tablet 0  . oxyCODONE-acetaminophen (PERCOCET/ROXICET) 5-325 MG per tablet  Take 1-2 tablets by mouth every 4 (four) hours as needed. (Patient not taking: Reported on 12/15/2016) 61 tablet 0  . oxyCODONE-acetaminophen (PERCOCET/ROXICET) 5-325 MG per tablet Take 1-2 tablets by mouth every 4 (four) hours as needed for moderate pain. (Patient not taking: Reported on 12/15/2016) 60 tablet 0  . oxyCODONE-acetaminophen (PERCOCET/ROXICET) 5-325 MG per tablet Take 1-2 tablets by mouth every 6 (six) hours as needed for moderate pain. (Patient not taking: Reported on 12/15/2016) 80 tablet 0  . pregabalin (LYRICA) 75 MG capsule Take 1 capsule (75 mg total) by mouth 2 (two) times daily. (Patient not taking: Reported on 12/15/2016)      Home: Home Living Family/patient expects to be discharged to:: Private residence Living Arrangements: Spouse/significant other Available Help at Discharge: Family, Available 24 hours/day Type of Home: House Home Access: Stairs to enter Technical brewer of Steps: 4 Home Layout: One level Bathroom Shower/Tub: Multimedia programmer: [place]: Environmental consultant - 2 wheels, [employer] - single point, Wheelchair - manual, Bedside commode, Shower seat, Adaptive equipment   Functional History: Prior Function Level of Independence: Independent Comments: Several previous back surgeries  Functional Status:  Mobility: Bed Mobility Overal bed mobility: Needs Assistance Bed Mobility: Rolling, Sidelying to Sit, Sit to Sidelying Rolling: Supervision Sidelying to sit: Min assist Sit to sidelying: Min assist General bed mobility comments: min A to elevate trunk to sit EOB and with LEs back onto bed Transfers Overall transfer level: Needs assistance Equipment used: Rolling walker (2 wheeled) Transfers: Sit to/from Stand Sit to Stand: Mod assist General transfer comment: Pt politely declined stating that she had just gotten back into bed Ambulation/Gait Ambulation/Gait assistance: Min assist, +2 safety/equipment, Mod assist (chair  follow) Ambulation Distance (Feet): 12 Feet Assistive device: Rolling walker (2 wheeled) Gait Pattern/deviations: Step-through pattern, Decreased step length - right, Decreased step length - left, Decreased dorsiflexion - left, Shuffle, Trunk flexed General Gait Details: shuffling gait initially; multimodal cues for posture and vc for safe use of AD, sequencing, and increased bilat step length/height; pt with difficulty advancing L LE Gait velocity: decreased  Gait velocity  interpretation: Below normal speed for age/gender  ADL: ADL Overall ADL's : Needs assistance/impaired Eating/Feeding: Set up, Sitting Grooming: Set up, Sitting Upper Body Bathing: Minimal assistance, Sitting Lower Body Bathing: Maximal assistance, Sit to/from stand Upper Body Dressing : Minimal assistance, Sitting Lower Body Dressing: Maximal assistance, Sit to/from stand Functional mobility during ADLs: Minimal assistance, Rolling walker General ADL Comments: pt agreeable to EOB sitting, but no OOB acitivity with OT. Reviewed use fo ADL A/E andpt able to verbalize proper use  Cognition: Cognition Overall Cognitive Status: Within Functional Limits for tasks assessed Orientation Level: Oriented X4 Cognition Arousal/Alertness: Awake/alert Behavior During Therapy: WFL for tasks assessed/performed Overall Cognitive Status: Within Functional Limits for tasks assessed    Blood pressure (!) 146/51, pulse (!) 59, temperature 98.2 F (36.8 C), temperature source Oral, resp. rate 18, height _0  (1.575 m), weight 74.4 kg (164 lb), SpO2 97 %. Physical Exam  Nursing note and vitals reviewed. Constitutional: She is oriented to person, place, and time. She appears well-developed and well-nourished. Nasal cannula in place.  HENT:  Head: Normocephalic and atraumatic.  Eyes: Pupils are equal, round, and reactive to light. Conjunctivae and EOM are normal.  Neck: Normal range of motion. Neck supple.  Cardiovascular:  Normal rate and regular rhythm.  Exam reveals no friction rub.   No murmur heard. Respiratory: Effort normal. No respiratory distress. She has no wheezes. She has no rales.  GI: Soft. Bowel sounds are normal. She exhibits no distension. There is no tenderness.  Musculoskeletal: She exhibits no edema or tenderness.  Neurological: She is alert and oriented to person, place, and time.  UE grossly 5/5. Decreased sensation below the waist, left more so than right. Motor: LLE 1+ to 1/5 prox to distal. RLE: 2 to 2+/5 prox to distal. DTR's 1+.   Skin: Skin is warm and dry.  Psychiatric: She has a normal mood and affect. Her behavior is normal. Judgment and thought content normal.    Lab Results Last 48 Hours        Results for orders placed or performed during the hospital encounter of 12/15/16 (from the past 48 hour(s))  CBC with Differential/Platelet     Status: Abnormal   Collection Time: 12/19/16  2:15 PM  Result Value Ref Range   WBC 12.4 (H) 4.0 - 10.5 K/uL   RBC 4.12 3.87 - 5.11 MIL/uL   Hemoglobin 11.9 (L) 12.0 - 15.0 g/dL   HCT 36.4 36.0 - 46.0 %   MCV 88.3 78.0 - 100.0 fL   MCH 28.9 26.0 - 34.0 pg   MCHC 32.7 30.0 - 36.0 g/dL   RDW 14.4 11.5 - 15.5 %   Platelets 255 150 - 400 K/uL   Neutrophils Relative % 80 %   Neutro Abs 9.8 (H) 1.7 - 7.7 K/uL   Lymphocytes Relative 11 %   Lymphs Abs 1.4 0.7 - 4.0 K/uL   Monocytes Relative 9 %   Monocytes Absolute 1.1 (H) 0.1 - 1.0 K/uL   Eosinophils Relative 0 %   Eosinophils Absolute 0.0 0.0 - 0.7 K/uL   Basophils Relative 0 %   Basophils Absolute 0.0 0.0 - 0.1 K/uL  Comprehensive metabolic panel     Status: Abnormal   Collection Time: 12/19/16  2:15 PM  Result Value Ref Range   Sodium 138 135 - 145 mmol/L   Potassium 4.1 3.5 - 5.1 mmol/L   Chloride 102 101 - 111 mmol/L   CO2 29 22 - 32 mmol/L   Glucose, Bld  171 (H) 65 - 99 mg/dL   BUN 20 6 - 20 mg/dL   Creatinine, Ser 0.63 0.44 - 1.00 mg/dL   Calcium  8.7 (L) 8.9 - 10.3 mg/dL   Total Protein 5.3 (L) 6.5 - 8.1 g/dL   Albumin 2.7 (L) 3.5 - 5.0 g/dL   AST 47 (H) 15 - 41 U/L   ALT 60 (H) 14 - 54 U/L   Alkaline Phosphatase 43 38 - 126 U/L   Total Bilirubin 0.7 0.3 - 1.2 mg/dL   GFR calc non Af Amer >60 >60 mL/min   GFR calc Af Amer >60 >60 mL/min    Comment: (NOTE) The eGFR has been calculated using the CKD EPI equation. This calculation has not been validated in all clinical situations. eGFR's persistently <60 mL/min signify possible Chronic Kidney Disease.    Anion gap 7 5 - 15     Imaging Results (Last 48 hours)  No results found.       Medical Problem List and Plan: 1.  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  DVT Prophylaxis/Anticoagulation: Mechanical: Sequential compression devices, below knee Bilateral lower extremities 3. Pain Management: Improving. Continue prn hydrocodone/flexeril.  4. Mood: team to  Provide ego support. LCSW to follow for evaluation and support.  5. Neuropsych: This patient is capable of making decisions on her own behalf. 6. Skin/Wound Care: Routine pressure relief measures.  Monitor wound daily.  7. Fluids/Electrolytes/Nutrition: maintain adequate nutritonal and hydration status. Check lytes in am.  8.  HTN: Monitor BP bid. Continue Toprol.  9. H/o urinary incontinence/retention: has resolved--will monitor voiding with PVR checks x 24 hours.  Continue Flomax (home med) 10.  Chronic dysphagia/GERD: On Protonix with reflux precautions.  11. Leucocytosis: Likely reactive. Check UA/UCS.  12. Hyperglycemia: Question due to stress v/s steroids. Will check Hgb A1c.  13. H/o Asthma with SOB: On dulera with albuterol prn.  Encourage IS every hour.  14. Anxiety d/o: Managed with Serax at nights--ativan substituted   Post Admission Physician Evaluation: 1. Functional deficits secondary  to T12 myelopathy. 2. Patient is  admitted to receive collaborative, interdisciplinary care between the physiatrist, rehab nursing staff, and therapy team. 3. Patient's level of medical complexity and substantial therapy needs in context of that medical necessity cannot be provided at a lesser intensity of care such as a SNF. 4. Patient has experienced substantial functional loss from his/her baseline which was documented above under the "Functional History" and "Functional Status" headings.  Judging by the patient's diagnosis, physical exam, and functional history, the patient has potential for functional progress which will result in measurable gains while on inpatient rehab.  These gains will be of substantial and practical use upon discharge  in facilitating mobility and self-care at the household level. 5. Physiatrist will provide 24 hour management of medical needs as well as oversight of the therapy plan/treatment and provide guidance as appropriate regarding the interaction of the two. 6. The Preadmission Screening has been reviewed and patient status is unchanged unless otherwise stated above. 7. 24 hour rehab nursing will assist with bladder management, bowel management, safety, skin/wound care, disease management, medication administration, pain management and patient education  and help integrate therapy concepts, techniques,education, etc. 8. PT will assess and treat for/with: Lower extremity strength, range of motion, stamina, balance, functional mobility, safety, adaptive techniques and equipment, post-operative precautions, orthotics, pain control, family education.  Goals are: supervision. 9. OT will assess and treat for/with: ADL's, functional mobility, safety, upper extremity strength, adaptive techniques and equipment, NMR, pain control, back precautions, family ed.   Goals are: mod I to supervision. Therapy may proceed with showering this patient. 10. SLP will assess and treat for/with: n/a.  Goals are: n/a. 11. Case  Management and Social Worker will assess and treat for psychological issues and discharge planning. 12. Team conference will be held weekly to assess progress toward goals and to determine barriers to discharge. 13. Patient will receive at least 3 hours of therapy per day at least 5 days per week. 14. ELOS: 12-17 days       15. Prognosis:  excellent     Meredith Staggers, MD, [facility] 12/21/2016  Bary Leriche, Hershal Coria 12/21/2016

## 2016-12-22 ENCOUNTER — Inpatient Hospital Stay (HOSPITAL_COMMUNITY): Payer: Medicare Other

## 2016-12-22 ENCOUNTER — Inpatient Hospital Stay (HOSPITAL_COMMUNITY): Payer: Medicare Other | Admitting: Physical Therapy

## 2016-12-22 ENCOUNTER — Inpatient Hospital Stay (HOSPITAL_COMMUNITY): Payer: Medicare Other | Admitting: Occupational Therapy

## 2016-12-22 DIAGNOSIS — R609 Edema, unspecified: Secondary | ICD-10-CM

## 2016-12-22 DIAGNOSIS — G8220 Paraplegia, unspecified: Secondary | ICD-10-CM

## 2016-12-22 DIAGNOSIS — D72829 Elevated white blood cell count, unspecified: Secondary | ICD-10-CM

## 2016-12-22 LAB — URINALYSIS, ROUTINE W REFLEX MICROSCOPIC
BILIRUBIN URINE: NEGATIVE
Glucose, UA: NEGATIVE mg/dL
Hgb urine dipstick: NEGATIVE
Ketones, ur: NEGATIVE mg/dL
Leukocytes, UA: NEGATIVE
NITRITE: NEGATIVE
PROTEIN: NEGATIVE mg/dL
SPECIFIC GRAVITY, URINE: 1.015 (ref 1.005–1.030)
pH: 7 (ref 5.0–8.0)

## 2016-12-22 LAB — COMPREHENSIVE METABOLIC PANEL
ALT: 85 U/L — AB (ref 14–54)
AST: 36 U/L (ref 15–41)
Albumin: 2.5 g/dL — ABNORMAL LOW (ref 3.5–5.0)
Alkaline Phosphatase: 35 U/L — ABNORMAL LOW (ref 38–126)
Anion gap: 7 (ref 5–15)
BUN: 21 mg/dL — ABNORMAL HIGH (ref 6–20)
CHLORIDE: 103 mmol/L (ref 101–111)
CO2: 29 mmol/L (ref 22–32)
CREATININE: 0.66 mg/dL (ref 0.44–1.00)
Calcium: 8.4 mg/dL — ABNORMAL LOW (ref 8.9–10.3)
Glucose, Bld: 136 mg/dL — ABNORMAL HIGH (ref 65–99)
POTASSIUM: 4.4 mmol/L (ref 3.5–5.1)
Sodium: 139 mmol/L (ref 135–145)
Total Bilirubin: 1 mg/dL (ref 0.3–1.2)
Total Protein: 4.9 g/dL — ABNORMAL LOW (ref 6.5–8.1)

## 2016-12-22 LAB — CBC WITH DIFFERENTIAL/PLATELET
Basophils Absolute: 0 10*3/uL (ref 0.0–0.1)
Basophils Relative: 0 %
EOS PCT: 0 %
Eosinophils Absolute: 0 10*3/uL (ref 0.0–0.7)
HCT: 36.3 % (ref 36.0–46.0)
Hemoglobin: 11.6 g/dL — ABNORMAL LOW (ref 12.0–15.0)
LYMPHS ABS: 1.3 10*3/uL (ref 0.7–4.0)
LYMPHS PCT: 14 %
MCH: 28.1 pg (ref 26.0–34.0)
MCHC: 32 g/dL (ref 30.0–36.0)
MCV: 87.9 fL (ref 78.0–100.0)
MONO ABS: 0.7 10*3/uL (ref 0.1–1.0)
Monocytes Relative: 7 %
Neutro Abs: 7.1 10*3/uL (ref 1.7–7.7)
Neutrophils Relative %: 79 %
PLATELETS: 269 10*3/uL (ref 150–400)
RBC: 4.13 MIL/uL (ref 3.87–5.11)
RDW: 14.5 % (ref 11.5–15.5)
WBC: 9 10*3/uL (ref 4.0–10.5)

## 2016-12-22 MED ORDER — DEXAMETHASONE 4 MG PO TABS
4.0000 mg | ORAL_TABLET | Freq: Three times a day (TID) | ORAL | Status: AC
Start: 2016-12-25 — End: 2016-12-26
  Administered 2016-12-25 – 2016-12-26 (×6): 4 mg via ORAL
  Filled 2016-12-22 (×6): qty 1

## 2016-12-22 MED ORDER — DEXAMETHASONE 2 MG PO TABS
1.0000 mg | ORAL_TABLET | Freq: Three times a day (TID) | ORAL | Status: AC
  Administered 2016-12-29 – 2016-12-30 (×6): 1 mg via ORAL
  Filled 2016-12-22 (×7): qty 1

## 2016-12-22 MED ORDER — ENOXAPARIN SODIUM 40 MG/0.4ML ~~LOC~~ SOLN
40.0000 mg | SUBCUTANEOUS | Status: DC
  Administered 2016-12-23 – 2017-01-03 (×13): 40 mg via SUBCUTANEOUS
  Filled 2016-12-22 (×12): qty 0.4

## 2016-12-22 MED ORDER — DEXAMETHASONE 6 MG PO TABS
6.0000 mg | ORAL_TABLET | Freq: Three times a day (TID) | ORAL | Status: AC
  Administered 2016-12-22 – 2016-12-24 (×8): 6 mg via ORAL
  Filled 2016-12-22 (×8): qty 1

## 2016-12-22 MED ORDER — DEXAMETHASONE 2 MG PO TABS
2.0000 mg | ORAL_TABLET | Freq: Three times a day (TID) | ORAL | Status: AC
  Administered 2016-12-27 – 2016-12-28 (×6): 2 mg via ORAL
  Filled 2016-12-22 (×6): qty 1

## 2016-12-22 NOTE — Evaluation (Signed)
Physical Therapy Assessment and Plan  Patient Details  Name: Kim Mcgee MRN: [id number] Date of Birth: 1940-06-29  PT Diagnosis: Abnormal posture, Abnormality of gait, Coordination disorder, Difficulty walking, Impaired sensation and Muscle weakness and paraparesis Rehab Potential: Excellent ELOS: 10-12 days   Today's Date: 12/22/2016 PT Individual Time: 0800-0900 PT Individual Time Calculation (min): 60 min    Problem List:  Patient Active Problem List   Diagnosis Date Noted  . Cord compression myelopathy (Kim Mcgee) 12/21/2016  . Back pain   . Confusion, postoperative   . Laryngospasm   . Neuropathic pain   . Steroid-induced hyperglycemia   . Post-op pain   . Leukocytosis   . Dyspnea 12/17/2016  . Hypothyroidism 12/17/2016  . Hypertension 12/17/2016  . Coronary artery disease 12/17/2016  . High cholesterol 12/17/2016  . 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 S/P lumbar laminectomy 12/15/2016  . Lumbar spinal stenosis 07/30/2014  . Chest pain 02/11/2012  . Spinal stenosis 02/10/2012  . Hypoxemia 02/10/2012  . H/O laminectomy 02/10/2012  . GERD (gastroesophageal reflux disease) 02/10/2012  . Vocal cord dysfunction 02/10/2012  . Shortness of breath 02/09/2012    Past Medical History:  Past Medical History:  Diagnosis Date  . Anxiety   . Arthritis    lumbar DDD  . Asthma   . Basal cell carcinoma of nose   . Chronic lower back pain   . Coronary artery disease   . Dysrhythmia   . GERD (gastroesophageal reflux disease)   . High cholesterol   . Hypertension   . Hypothyroidism   . Normal cardiac stress test   . PONV (postoperative nausea and vomiting)   . Shortness of breath    /w walking long distance   . Swallowing difficulty    being seen by ENT- had endoscopy- told that its probably related to past neck surgeries    Past Surgical History:  Past Surgical History:  Procedure Laterality Date  . ANTERIOR CERVICAL DECOMP/DISCECTOMY FUSION  2010  . APPENDECTOMY    .  BACK SURGERY    . BASAL CELL CARCINOMA EXCISION     "cut off nose"  . CARDIAC CATHETERIZATION  1970s; 1990s?; 2017  . CATARACT EXTRACTION W/ INTRAOCULAR LENS  IMPLANT, BILATERAL Bilateral   . CORONARY ANGIOPLASTY WITH STENT PLACEMENT  ~ 2017  . DECOMPRESSIVE LUMBAR LAMINECTOMY LEVEL 1 N/A 12/15/2016   Procedure: DECOMPRESSIVE  LAMINECTOMY Thoracic twelve to lumbar one;  Surgeon: Kary Kos, MD;  Location: [place];  Service: Neurosurgery;  Laterality: N/A;  . DILATION AND CURETTAGE OF UTERUS    . OOPHORECTOMY     "?side"  . POSTERIOR FUSION LUMBAR SPINE  ~ 2013; 2016  . POSTERIOR LUMBAR FUSION 4 LEVEL N/A 12/15/2016   Procedure: Posterior Fusion Thoracic- ten to Lumbar- two;  Surgeon: Kary Kos, MD;  Location: [place];  Service: Neurosurgery;  Laterality: N/A;  . TONSILLECTOMY    . TOTAL THYROIDECTOMY  1990's  . TUBAL LIGATION      Assessment & Plan Clinical Impression: A76 y.o.femalewith history of CAD, SOB, HTN, laryngospasm's, progressive back pain radiating to BLE and LLE weakness for 3-4 weeks with left foot drop who was treated with [facility] 12/13/16. She was admitted on 7/18 with increase in pain and fall due to BLE giving away. 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 She was taken to OR on 7/19 for decompressive laminectomy T12-L1 with removal of large synovial cyst and decompression of  T12 and L1 nerve roots with removal of loose L1 screws and arthrodesis as well as repair of inadvertent dural tear by Dr. Saintclair Mcgee. Post op on bedrest till 7/22. She has had issues with confusion and progressive dyspnea without hypoxia--IS recommended. Therapy evaluations done and CIR recommended for follow up therapy. Was independent without AD till about 4-5 weeks ago. Was getting week and using walker 2 weeks PTA. She was able to sit up for 60 minutes yesterday and has been getting up to [facility] with  assistance.  Patient transferred to CIR on 12/21/2016 .   Patient currently requires min with mobility secondary to muscle weakness, decreased cardiorespiratoy endurance, impaired timing and sequencing, unbalanced muscle activation and decreased coordination and decreased sitting balance, decreased standing balance, decreased postural control, decreased balance strategies and difficulty maintaining precautions.  Prior to hospitalization, patient was modified independent  with mobility and lived with Spouse in a House home.  Home access is  (multiple entryways, entrance she plans to use has one thereshold step) .  Patient will benefit from skilled PT intervention to maximize safe functional mobility, minimize fall risk and decrease caregiver burden for planned discharge home with 24 hour assist.  Anticipate patient will benefit from follow up OP at discharge.  PT - End of Session Activity Tolerance: Tolerates 30+ min activity with multiple rests Endurance Deficit: Yes Endurance Deficit Description: requires seated rest breaks after short duration mobility activities PT Assessment Rehab Potential (ACUTE/IP ONLY): Excellent PT Patient demonstrates impairments in the following area(s): Balance;Endurance;Motor;Pain;Safety;Sensory;Skin Integrity PT Transfers Functional Problem(s): Bed Mobility;Bed to Chair;Car;Furniture PT Locomotion Functional Problem(s): Ambulation;Wheelchair Mobility;Stairs PT Plan PT Intensity: Minimum of 1-2 x/day ,45 to 90 minutes PT Frequency: 5 out of 7 days PT Duration Estimated Length of Stay: 10-12 days PT Treatment/Interventions: Ambulation/gait training;Discharge planning;Functional mobility training;Therapeutic Exercise;Therapeutic Activities;Psychosocial support;Wheelchair propulsion/positioning;Skin care/wound management;Neuromuscular re-education;Disease management/prevention;Balance/vestibular training;DME/adaptive equipment instruction;Pain management;UE/LE Strength  taining/ROM;UE/LE Coordination activities;Stair training;Patient/family education;Community reintegration PT Transfers Anticipated Outcome(s): modI PT Locomotion Anticipated Outcome(s): S with LRAD PT Recommendation Recommendations for Other Services: Neuropsych consult;Therapeutic Recreation consult Therapeutic Recreation Interventions: Pet therapy;Kitchen group;Stress management;Outing/community reintergration Follow Up Recommendations: Outpatient PT;24 hour supervision/assistance Patient destination: Home Equipment Details: has RW, w/c, cane  Skilled Therapeutic Intervention Pt received supine in bed, c/o pain as below and agreeable to treatment. PT initial evaluation performed and completed with minA overall as described below, limited by LE strength and coordination deficits, impaired proprioception and endurance deficits. Educated pt in rehab process, goals, estimated length of stay to be determined following OT evaluation, falls prevention safety with recommendation to use call bell and wait for staff to assist before transferring; pt agreeable to all the above. Remained seated in w/c at end of session, all needs in reach.   PT Evaluation Precautions/Restrictions Precautions Precautions: Back;Fall Precaution Comments: Reviewed back precations Restrictions Weight Bearing Restrictions: No General Chart Reviewed: Yes Response to Previous Treatment: Not applicable Family/Caregiver Present: No  Pain Pain Assessment Pain Assessment: 0-10 Pain Score: 4  Pain Type: Surgical pain Pain Location: Back Pain Orientation: Mid Pain Descriptors / Indicators: Aching;Sore Pain Onset: On-going Pain Intervention(s): Other (Comment) (pre-medicated) Multiple Pain Sites: No Home Living/Prior Functioning Home Living Available Help at Discharge: Family;Available 24 hours/day Type of Home: House Home Access: Stairs to enter [employer] of Steps:  (multiple entryways, entrance she  plans to use has one thereshold step) Entrance Stairs-Rails: None (at threshold step) Home Layout: Able to live on main level with bedroom/bathroom;Two level  Lives With: Spouse Prior Function Level of  Independence: Independent with transfers;Requires assistive device for independence;Independent with gait;Independent with basic ADLs  Able to Take Stairs?: Yes Driving: Yes 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  Vision/Perception  Perception Perception: Within Functional Limits Praxis Praxis: Intact  Cognition Overall Cognitive Status: Within Functional Limits for tasks assessed Arousal/Alertness: Awake/alert Memory: Appears intact Sensation Sensation Light Touch: Impaired Detail Light Touch Impaired Details: Impaired RLE;Impaired LLE (diminished sensation BLEs L worse than R; able to feel in all dermatomes however reports abnormal/decreased sensation) Proprioception: Impaired Detail Proprioception Impaired Details: Impaired RLE;Impaired LLE Coordination Gross Motor Movements are Fluid and Coordinated: No Heel Shin Test: tested supine d/t strength deficits; impaired with decreased speed, excursion and accuracy Motor  Motor Motor - Skilled Clinical Observations: LE weakness  Mobility Bed Mobility Bed Mobility: Rolling Right;Rolling Left;Left Sidelying to Sit Rolling Right: 5: Supervision;With rail Rolling Right Details: Verbal cues for precautions/safety Rolling Left: 5: Supervision;With rail Rolling Left Details: Verbal cues for precautions/safety Left Sidelying to Sit: 3: Mod assist;HOB flat;With rails Left Sidelying to Sit Details: Verbal cues for technique;Tactile cues for posture;Tactile cues for placement;Tactile cues for weight shifting;Manual facilitation for weight shifting;Manual facilitation for weight  bearing Transfers Transfers: Yes Sit to Stand: 3: Mod assist;With upper extremity assist;From bed;With armrests Sit to Stand Details: Verbal cues for sequencing;Verbal cues for technique;Manual facilitation for weight shifting;Manual facilitation for weight bearing;Tactile cues for posture Stand to Sit: 4: Min assist Stand to Sit Details (indicate cue type and reason): Verbal cues for technique;Tactile cues for weight shifting Stand Pivot Transfers: 3: Mod assist Stand Pivot Transfer Details: Verbal cues for technique;Manual facilitation for weight shifting;Manual facilitation for weight bearing Locomotion  Ambulation Ambulation: Yes Ambulation/Gait Assistance: 4: Min assist;1: +2 Total assist (w/c follow d/t poor endurance) Ambulation Distance (Feet): 25 Feet Assistive device: Rolling walker Ambulation/Gait Assistance Details: Tactile cues for posture;Verbal cues for safe use of DME/AE Gait Gait: Yes Gait Pattern: Impaired Gait Pattern: Decreased stride length;Trunk flexed Gait velocity: decreased  Stairs / Additional Locomotion Stairs: No Wheelchair Mobility Wheelchair Mobility: Yes Wheelchair Assistance: 5: Investment banker, operational Details: Verbal cues for Marketing executive: Both upper extremities Wheelchair Parts Management: Needs assistance Distance: 50  Trunk/Postural Assessment  Cervical Assessment Cervical Assessment: Within Functional Limits Thoracic Assessment Thoracic Assessment: Within Functional Limits Lumbar Assessment Lumbar Assessment: Exceptions to [facility] (flattened lumbar lordosis) Postural Control Postural Control: Deficits on evaluation (delayed/inefficient ankle/hip/stepping strategies in standing)  Balance Balance Balance Assessed: Yes Static Sitting Balance Static Sitting - Balance Support: Feet supported;Bilateral upper extremity supported Static Sitting - Level of Assistance: 6: Modified independent (Device/Increase  time) Dynamic Sitting Balance Dynamic Sitting - Balance Support: Feet supported;Bilateral upper extremity supported Dynamic Sitting - Level of Assistance: 5: Stand by assistance Static Standing Balance Static Standing - Balance Support: Bilateral upper extremity supported;During functional activity Static Standing - Level of Assistance: 5: Stand by assistance Dynamic Standing Balance Dynamic Standing - Balance Support: Bilateral upper extremity supported;During functional activity Dynamic Standing - Level of Assistance: 4: Min assist Dynamic Standing - Balance Activities: Reaching for objects Extremity Assessment  RUE Assessment RUE Assessment: Within Functional Limits LUE Assessment LUE Assessment: Within Functional Limits RLE Assessment RLE Assessment: Exceptions to [facility] RLE PROM (degrees) Overall PROM Right Lower Extremity: Within functional limits for tasks assessed RLE Strength RLE Overall Strength: Deficits Right Hip Flexion: 4-/5 Right Hip ABduction: 4-/5 Right Hip ADduction: 4/5 Right Knee Flexion: 4/5 Right  Knee Extension: 4/5 Right Ankle Dorsiflexion: 4/5 Right Ankle Plantar Flexion: 4/5 LLE Assessment LLE Assessment: Exceptions to WFL LLE PROM (degrees) Overall PROM Left Lower Extremity: Within functional limits for tasks assessed LLE Strength LLE Overall Strength: Deficits Left Hip Flexion: 3+/5 Left Hip ABduction: 4-/5 Left Hip ADduction: 4/5 Left Knee Flexion: 4-/5 Left Knee Extension: 4-/5 Left Ankle Dorsiflexion: 4/5 Left Ankle Plantar Flexion: 4/5   See Function Navigator for Current Functional Status.   Refer to Care Plan for Long Term Goals  Recommendations for other services: Neuropsych and Therapeutic Recreation  Pet therapy, Kitchen group, Stress management and Outing/community reintegration  Discharge Criteria: Patient will be discharged from PT if patient refuses treatment 3 consecutive times without medical reason, if treatment goals not met,  if there is a change in medical status, if patient makes no progress towards goals or if patient is discharged from hospital.  The above assessment, treatment plan, treatment alternatives and goals were discussed and mutually agreed upon: by patient  Luberta Mutter 12/22/2016, 9:01 AM

## 2016-12-22 NOTE — Evaluation (Signed)
Occupational Therapy Assessment and Plan  Patient Details  Name: Kim Mcgee MRN: [id number] Date of Birth: February 19, 1941  OT Diagnosis: muscle weakness (generalized), paraparesis at level T12 -L1 Rehab Potential: Rehab Potential (ACUTE ONLY): Excellent ELOS: 10-12 days   Today's Date: 12/22/2016 OT Individual Time: 0900-1005 OT Individual Time Calculation (min): 65 min     Problem List:  Patient Active Problem List   Diagnosis Date Noted  . 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 Back pain   . Confusion, postoperative   . Laryngospasm   . Neuropathic pain   . Steroid-induced hyperglycemia   . Post-op pain   . Leukocytosis   . Dyspnea 12/17/2016  . Hypothyroidism 12/17/2016  . Hypertension 12/17/2016  . Coronary artery disease 12/17/2016  . High cholesterol 12/17/2016  . Paraparesis ([place]) 12/16/2016  . S/P lumbar laminectomy 12/15/2016  . Lumbar spinal stenosis 07/30/2014  . Chest pain 02/11/2012  . Spinal stenosis 02/10/2012  . Hypoxemia 02/10/2012  . H/O laminectomy 02/10/2012  . GERD (gastroesophageal reflux disease) 02/10/2012  . Vocal cord dysfunction 02/10/2012  . Shortness of breath 02/09/2012    Past Medical History:  Past Medical History:  Diagnosis Date  . Anxiety   . Arthritis    lumbar DDD  . Asthma   . Basal cell carcinoma of nose   . Chronic lower back pain   . Coronary artery disease   . Dysrhythmia   . GERD (gastroesophageal reflux disease)   . High cholesterol   . Hypertension   . Hypothyroidism   . Normal cardiac stress test   . PONV (postoperative nausea and vomiting)   . Shortness of breath    /w walking long distance   . Swallowing difficulty    being seen by ENT- had endoscopy- told that its probably related to past neck surgeries    Past Surgical History:  Past Surgical History:  Procedure Laterality Date  . ANTERIOR CERVICAL DECOMP/DISCECTOMY FUSION  2010  . APPENDECTOMY    . BACK SURGERY    . BASAL CELL CARCINOMA  EXCISION     "cut off nose"  . CARDIAC CATHETERIZATION  1970s; 1990s?; 2017  . CATARACT EXTRACTION W/ INTRAOCULAR LENS  IMPLANT, BILATERAL Bilateral   . CORONARY ANGIOPLASTY WITH STENT PLACEMENT  ~ 2017  . DECOMPRESSIVE LUMBAR LAMINECTOMY LEVEL 1 N/A 12/15/2016   Procedure: DECOMPRESSIVE  LAMINECTOMY Thoracic twelve to lumbar one;  Surgeon: Kary Kos, MD;  Location: [facility];  Service: Neurosurgery;  Laterality: N/A;  . DILATION AND CURETTAGE OF UTERUS    . OOPHORECTOMY     "?side"  . POSTERIOR FUSION LUMBAR SPINE  ~ 2013; 2016  . POSTERIOR LUMBAR FUSION 4 LEVEL N/A 12/15/2016   Procedure: Posterior Fusion Thoracic- ten to Lumbar- two;  Surgeon: Kary Kos, MD;  Location: [place];  Service: Neurosurgery;  Laterality: N/A;  . TONSILLECTOMY    . TOTAL THYROIDECTOMY  1990's  . TUBAL LIGATION      Assessment & Plan Clinical Impression:  Kim Mcgee is a 76 y.o. female with history of CAD, SOB, HTN, laryngospasm's, chronic dysphagia, progressive back pain radiating to BLE and LLE weakness for 3-4 weeks with left foot drop who was treated with [facility] 12/13/16. She was admitted on 7/18 with increase in pain and fall due to BLE giving away. MRI spine done revealing multifactorial degenerative changes with severe canal stenosis T 12-L1  and cord compression--slightly worse since 6/27 MRI. She was taken to OR on 7/19 for decompressive laminectomy T12-L1 with removal  of large synovial cyst and decompression of T12 and L1 nerve roots with removal of loose L1 screws and arthrodesis as well as repair of inadvertent dural tear by Dr. Saintclair Halsted. Post op on bedrest till 7/22. She has had issues with confusion and progressive dyspnea without hypoxia--IS recommended.  pain control improving and she was started on decadron taper. Therapy ongoing and patient with significant deficits in mobility and ADL tasks. CIR was  recommended for follow up therapy.  Patient transferred to CIR on 12/21/2016 .    Patient currently requires mod  with basic self-care skills secondary to muscle weakness, unbalanced muscle activation and decreased standing balance and decreased balance strategies.  Prior to hospitalization, patient could complete self care/ ADLs with modified independent .  Patient will benefit from skilled intervention to increase independence with basic self-care skills prior to discharge home with care partner.  Anticipate patient will require intermittent supervision and follow up home health.  OT - End of Session Activity Tolerance: Tolerates 10 - 20 min activity with multiple rests Endurance Deficit: Yes OT Assessment Rehab Potential (ACUTE ONLY): Excellent OT Patient demonstrates impairments in the following area(s): Balance;Endurance;Motor;Sensory;Pain OT Basic ADL's Functional Problem(s): Bathing;Dressing;Toileting OT Advanced ADL's Functional Problem(s): Simple Meal Preparation OT Transfers Functional Problem(s): Toilet;Tub/Shower OT Additional Impairment(s): None OT Plan OT Intensity: Minimum of 1-2 x/day, 45 to 90 minutes OT Frequency: 5 out of 7 days OT Duration/Estimated Length of Stay: 10-12 days OT Treatment/Interventions: Medical illustrator training;Community reintegration;Discharge planning;DME/adaptive equipment instruction;Functional mobility training;Neuromuscular re-education;Pain management;Patient/family education;Psychosocial support;Self Care/advanced ADL retraining;Therapeutic Activities;Therapeutic Exercise;UE/LE Strength taining/ROM OT Self Feeding Anticipated Outcome(s): Independent OT Basic Self-Care Anticipated Outcome(s): mod I dressing, S shower OT Toileting Anticipated Outcome(s): mod I  OT Bathroom Transfers Anticipated Outcome(s): supervision OT Recommendation Patient destination: Home Follow Up Recommendations: Home health OT Equipment Recommended: To be determined   Skilled Therapeutic Intervention Pt seen for initial evaluation and ADL retraining including toileting and  showering.  Pt participated well and began to learn how to use AE to maintain her back precautions.  Explained purpose of OT, goals, expected LOS. Pt worked on standing with RW with min-mod A.  Pt resting in w/c with all needs met. (prior to shower, R hand covered with plastic bag to cover IV. In shower her IV popped out.  Compression applied and RN applied a gauze bandage)  OT Evaluation Precautions/Restrictions  Precautions Precautions: Back;Fall Precaution Comments: Reviewed back precations Restrictions Weight Bearing Restrictions: No    Vital Signs Therapy Vitals Pulse Rate: 68 BP: (!) 180/77 Pain Pain Assessment Pain Assessment: 0-10 Pain Score: 7  Pain Type: Surgical pain Pain Location: Back Pain Orientation: Mid Pain Descriptors / Indicators: Aching;Sore Pain Onset: On-going Pain Intervention(s): Rest Multiple Pain Sites: No Home Living/Prior Functioning Home Living Available Help at Discharge: Family, Available 24 hours/day Type of Home: House Home Access: Stairs to enter [employer] of Steps:  (multiple entryways, entrance she plans to use has one thereshold step) Entrance Stairs-Rails: None (at threshold step) Home Layout: Able to live on main level with bedroom/bathroom, Two level Bathroom Shower/Tub: Multimedia programmer: Standard  Lives With: Spouse Prior Function Level of Independence: Independent with transfers, Requires assistive device for independence, Independent with gait, Independent with basic ADLs, Independent with homemaking with ambulation  Able to Take Stairs?: Yes Driving: Yes 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  ADL ADL ADL Comments: refer to functional  navigator Vision Baseline Vision/History: Wears glasses Wears Glasses: Reading only Patient Visual Report: No change from baseline Vision Assessment?: No apparent visual deficits Perception  Perception: Within Functional Limits Praxis Praxis: Intact Cognition Overall Cognitive Status: Within Functional Limits for tasks assessed Arousal/Alertness: Awake/alert Orientation Level: Person;Place;Situation Person: Oriented Place: Oriented Situation: Oriented Year: 2018 Month: July Day of Week: Correct Memory: Appears intact Immediate Memory Recall: Sock;Blue;Bed Memory Recall: Sock;Blue;Bed Memory Recall Sock: Without Cue Memory Recall Blue: Without Cue Memory Recall Bed: Without Cue Sensation Sensation Light Touch: Impaired Detail Light Touch Impaired Details: Impaired RLE;Impaired LLE (diminished sensation BLEs L worse than R; able to feel in all dermatomes however reports abnormal/decreased sensation) Stereognosis: Appears Intact Hot/Cold: Appears Intact Proprioception: Impaired Detail Proprioception Impaired Details: Impaired RLE;Impaired LLE Coordination Gross Motor Movements are Fluid and Coordinated: No Fine Motor Movements are Fluid and Coordinated: Yes Heel Shin Test: tested supine d/t strength deficits; impaired with decreased speed, excursion and accuracy Motor  Motor Motor - Skilled Clinical Observations: LE weakness Mobility  Bed Mobility Bed Mobility: Rolling Right;Rolling Left;Left Sidelying to Sit Rolling Right: 5: Supervision;With rail Rolling Right Details: Verbal cues for precautions/safety Rolling Left: 5: Supervision;With rail Rolling Left Details: Verbal cues for precautions/safety Left Sidelying to Sit: 3: Mod assist;HOB flat;With rails Left Sidelying to Sit Details: Verbal cues for technique;Tactile cues for posture;Tactile cues for placement;Tactile cues for weight shifting;Manual facilitation for weight shifting;Manual facilitation for weight  bearing Transfers Sit to Stand: 3: Mod assist;With upper extremity assist;From bed;With armrests Sit to Stand Details: Verbal cues for sequencing;Verbal cues for technique;Manual facilitation for weight shifting;Manual facilitation for weight bearing;Tactile cues for posture Stand to Sit: 4: Min assist Stand to Sit Details (indicate cue type and reason): Verbal cues for technique;Tactile cues for weight shifting  Trunk/Postural Assessment  Cervical Assessment Cervical Assessment: Within Functional Limits Thoracic Assessment Thoracic Assessment: Within Functional Limits Lumbar Assessment Lumbar Assessment: Within Functional Limits Postural Control Postural Control: Within Functional Limits (back precautions)  Balance Static Standing Balance Static Standing - Level of Assistance: 5: Stand by assistance (with BUE support on RW) Dynamic Standing Balance Dynamic Standing - Level of Assistance: 4: Min assist (with BUE support on RW) Extremity/Trunk Assessment RUE Assessment RUE Assessment: Within Functional Limits LUE Assessment LUE Assessment: Within Functional Limits   See Function Navigator for Current Functional Status.   Refer to Care Plan for Long Term Goals  Recommendations for other services: None    Discharge Criteria: Patient will be discharged from OT if patient refuses treatment 3 consecutive times without medical reason, if treatment goals not met, if there is a change in medical status, if patient makes no progress towards goals or if patient is discharged from hospital.  The above assessment, treatment plan, treatment alternatives and goals were discussed and mutually agreed upon: by patient  [facility] 12/22/2016, 10:41 AM

## 2016-12-22 NOTE — Progress Notes (Signed)
Occupational Therapy Session Note  Patient Details  Name: Kim Mcgee MRN: [id number] Date of Birth: 08-08-1940  Today's Date: 12/22/2016 OT Individual Time: 1400-1430 OT Individual Time Calculation (min): 30 min    Short Term Goals: Week 1:  OT Short Term Goal 1 (Week 1): Pt will be able to don LB clothing with AE with set up. OT Short Term Goal 2 (Week 1): Pt will be able to cleanse self post toileting with Supervision. OT Short Term Goal 3 (Week 1): Pt will be able to ambulate in and out of the bathroom with RW with min A. OT Short Term Goal 4 (Week 1): Pt will be able to tolerate standing for 10 min to enable her to make a snack in the kitchen.   Skilled Therapeutic Interventions/Progress Updates:    Pt seen for OT session focusing on ADL re-training, AE training, education and mobility. Pt sitting up in w/c upon arrival, agreeable to tx session. Used sock aid to don B socks, carrying over all education from previous session. Worked with [place] shoe horn to don velcro shoes. Required assist for placement of foot in shoe as well as putting heel in shoe entirely as due to decreased sensation pt unable to feel that foot was not entirely in shoe. Pt's husband to bring tennis shoes tomorrow that maybe easier to don.  She requested return to supine at end of session. Completed stand pivot transfer with mod A. VCs for log rolling technique to return to supine. Pt left with all needs in reach and husband present.  Education provided throughout session regarding back pre-cautions (pt able to recall 1/3) and functional implications of pre-cautions, DME, bed mobility technique, and d/c planning.   Therapy Documentation Precautions:  Precautions Precautions: Back, Fall Precaution Comments: Reviewed back precations Restrictions Weight Bearing Restrictions: No Pain: Pain Assessment Pain Assessment: No/ denies pain ADL: ADL ADL Comments: refer to functional navigator  See Function Navigator for  Current Functional Status.   Therapy/Group: Individual Therapy  Lewis, [place] C 12/22/2016, 3:09 PM

## 2016-12-22 NOTE — Progress Notes (Signed)
Patient information reviewed and entered into eRehab system by Sadaf Przybysz, RN, CRRN, PPS Coordinator.  Information including medical coding and functional independence measure will be reviewed and updated through discharge.     Per nursing patient was given "Data Collection Information Summary for Patients in Inpatient Rehabilitation Facilities with attached "Privacy Act Statement-Health Care Records" upon admission.  

## 2016-12-22 NOTE — Progress Notes (Signed)
Occupational Therapy Note  Patient Details  Name: Kim Mcgee MRN: [id number] Date of Birth: 1940/11/07  Today's Date: 12/22/2016 OT Individual Time: 1000-1100 OT Individual Time Calculation (min): 60 min   Pt c/o 7/10 back pain; RN aware and repositoined Individual Therapy  Pt resting in w/c upon arrival.  Focus on AE use for LB dressing tasks.  Pt practiced doffing/donning socks and shoes with AE.  Pt currently unable to don shoes with AE.  Will continue to pursue options.  Pt performed sit<>stand X 6 during session and simulated reaching to buttocks to perform hygiene.  Pt verbalized 3/3 back precautions.  Pt remained seated in w/c with all needs within reach.      Leotis Shames [facility] 12/22/2016, 12:09 PM

## 2016-12-22 NOTE — Progress Notes (Signed)
[facility] PHYSICAL MEDICINE & REHABILITATION     PROGRESS NOTE    Subjective/Complaints: Had a good night. Noticed some saddle distribution numbness. Feet too. Pain controlled  ROS: pt denies nausea, vomiting, diarrhea, cough, shortness of breath or chest pain   Objective: Vital Signs: Blood pressure (!) 180/77, pulse 68, temperature 98.4 F (36.9 C), temperature source Oral, resp. rate 17, height 5\' 2"  (1.575 m), SpO2 95 %. Dg Chest 2 View  Result Date: 12/21/2016 CLINICAL DATA:  Shortness of breath EXAM: CHEST  2 VIEW COMPARISON:  12/17/2016 FINDINGS: Lower cervical hardware. Partially visualized thoracolumbar spinal rods and fixating screws. No acute consolidation or pleural effusion. Mild cardiomegaly. No pneumothorax. Degenerative osteophytes of the spine. IMPRESSION: 1. No acute infiltrate or edema 2. Borderline to mild cardiomegaly Electronically Signed   By: Donavan Foil M.D.   On: 12/21/2016 21:07    Recent Labs  12/19/16 1415 12/22/16 0546  WBC 12.4* 9.0  HGB 11.9* 11.6*  HCT 36.4 36.3  PLT 255 269    Recent Labs  12/19/16 1415 12/22/16 0546  NA 138 139  K 4.1 4.4  CL 102 103  GLUCOSE 171* 136*  BUN 20 21*  CREATININE 0.63 0.66  CALCIUM 8.7* 8.4*   CBG (last 3)  No results for input(s): GLUCAP in the last 72 hours.  Wt Readings from Last 3 Encounters:  12/15/16 74.4 kg (164 lb)  12/04/16 74.4 kg (164 lb)  07/30/14 75.3 kg (166 lb)    Physical Exam:  HENT:  Head: Normocephalicand atraumatic.  Eyes: Pupils are equal, round, and reactive to light. Conjunctivaeand EOMare normal.  Neck: Normal range of motion. Neck supple.  Cardiovascular: RRR without murmur. No JVD  Respiratory: CTA Bilaterally without wheezes or rales. Normal effort  GI: Soft. Bowel sounds are normal. She exhibits no distension. There is no tenderness.  Musculoskeletal: She exhibits no edemaor tenderness.  Neurological: She is alertand oriented to person, place, and time.  UE  grossly 5/5. Decreased sensation below the waist, left more so than right. Motor: LLE 1+ to 1/5 prox to distal--stable. RLE: 2 to 2+/5 prox to distal. DTR's 1+.  Skin: Skin is warmand dry.  Psychiatric: She has a normal mood and affect. Her behavior is normal. Judgmentand thought contentnormal.    Assessment/Plan: 1. 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 Physiatrist is providing close team supervision and 24 hour management of active medical problems listed below. Physiatrist and rehab team continue to assess barriers to discharge/monitor patient progress toward functional and medical goals.  Function:  Bathing Bathing position      Bathing parts      Bathing assist        Upper Body Dressing/Undressing Upper body dressing                    Upper body assist        Lower Body Dressing/Undressing Lower body dressing                                  Lower body assist        Toileting Toileting     Toileting steps completed by helper: Adjust clothing prior to toileting, Performs perineal hygiene, Adjust clothing after toileting Toileting Assistive Devices: Other (comment) (Bedside commode)  Toileting assist Assist level: Set up/obtain supplies, Touching  or steadying assistance (Pt.75%)   Transfers Chair/bed transfer   Chair/bed transfer method: Stand pivot Chair/bed transfer assist level: Moderate assist (Pt 50 - 74%/lift or lower) Chair/bed transfer assistive device: Dentist          Cognition Comprehension    Expression Expression assist level: Expresses complex ideas: With extra time/assistive device  Social Interaction Social Interaction assist level: Interacts appropriately with others - No medications needed.  Problem Solving Problem solving assist  level: Solves complex 90% of the time/cues < 10% of the time  Memory Memory assist level: Complete Independence: No helper   Medical Problem List and Plan: 1. Paraplegia and functional deficitssecondary to T12 myelopathy -beginning therapies 2. DVT Prophylaxis/Anticoagulation: Mechanical: Sequential compression devices, below knee Bilateral lower extremities 3. Pain Management: Improving. Continue prn hydrocodone/flexeril.  4. Mood: team to Provide ego support. LCSW to follow for evaluation and support.  5. Neuropsych: This patient iscapable of making decisions on herown behalf. 6. Skin/Wound Care: Routine pressure relief measures. Monitor wound daily.  7. Fluids/Electrolytes/Nutrition:encourage fluids  -I personally reviewed the patient's labs today.   8. HTN: Monitor BP bid. Continue Toprol.  9. H/o urinary incontinence/retention: has resolved--will monitor voiding with PVR checks x 24 hours. Continue Flomax (home med) 10. Chronic dysphagia/GERD: On Protonix with reflux precautions.  11. Leucocytosis: Likely reactive. UA neg. ucs pending  12. Hyperglycemia: Question due to stress v/s steroids.  c.  13. H/o Asthma with SOB: On dulera with albuterol prn. Encourage IS every hour.   -cxr unremarkable--reviewed personally   -encouraged IS/OOB 14. Anxiety d/o: Managed with Serax at nights--ativan substituted. Slept well.    LOS (Days) 1 A [place] T, MD 12/22/2016 9:03 AM

## 2016-12-22 NOTE — Progress Notes (Signed)
*  PRELIMINARY RESULTS* Vascular Ultrasound Bilateral lower extremity venous duplex has been completed.  Preliminary findings: No evidence of deep vein thrombosis in the visualized veins or baker's cysts bilaterally.   Kim Mcgee 12/22/2016, 4:39 PM

## 2016-12-23 ENCOUNTER — Inpatient Hospital Stay (HOSPITAL_COMMUNITY): Payer: Medicare Other | Admitting: Occupational Therapy

## 2016-12-23 ENCOUNTER — Inpatient Hospital Stay (HOSPITAL_COMMUNITY): Payer: Medicare Other

## 2016-12-23 ENCOUNTER — Inpatient Hospital Stay (HOSPITAL_COMMUNITY): Payer: Medicare Other | Admitting: Physical Therapy

## 2016-12-23 LAB — HEMOGLOBIN A1C
Hgb A1c MFr Bld: 5.8 % — ABNORMAL HIGH (ref 4.8–5.6)
Mean Plasma Glucose: 120 mg/dL

## 2016-12-23 NOTE — Progress Notes (Signed)
Physical Therapy Session Note  Patient Details  Name: Kim Mcgee MRN: [id number] Date of Birth: 09-24-1940  Today's Date: 12/23/2016 PT Individual Time: 1300-1415 PT Individual Time Calculation (min): 75 min   Short Term Goals: Week 1:  PT Short Term Goal 1 (Week 1): Pt will perform bed mobility minA PT Short Term Goal 2 (Week 1): Pt will perform transfers with S PT Short Term Goal 3 (Week 1): Pt will ambulate 100' with minA and LRAD PT Short Term Goal 4 (Week 1): Pt will initiate stair training  Skilled Therapeutic Interventions/Progress Updates: Pt received standing in bathroom with handoff from NT; completed toileting steps totalA requiring assist for clothing management and hygiene. Ambulatory transfer out of bathroom with RW and min guard; static standing balance at sink while performing hand hygiene with min guard. Performed forward ascent/backward descent of four 3" stairs with B handrails and assist needed for BLE placement d/t impaired coordination/strength. Stand pivot transfer w/c >mat table with RW and min guard. Sit <>supine with logroll and modA. Bridging x10-12 reps to scoot R/L on mat table; tactile facilitation to increase range and glute activation. Supine BLE PNF modified D2 flexion/extension pattern with rhythmic initiation (PROM>AAROM>AROM>resisted AROM as tolerated) for focus on LE coordination. Heel slides 2x10 reps with maxislide to reduce friction; AAROM required LLE due to fatigue towards end of each set. Gait x45' with RW and min guard; slow gait speed and forward flexed posture with increased weight bearing through [place]. Returned to room totalA in w/c; remained seated in w/c at end of session, family present and all needs in reach.      Therapy Documentation Precautions:  Precautions Precautions: Back, Fall Precaution Comments: Reviewed back precations Restrictions Weight Bearing Restrictions: No Pain: Pain Assessment Pain Assessment: 0-10 Pain Score: 0-No  pain Pain Type: Surgical pain Pain Location: Back Pain Orientation: Mid Pain Descriptors / Indicators: Aching;Sore Pain Frequency: Constant Pain Onset: On-going Patients Stated Pain Goal: 4 Pain Intervention(s): Medication (See eMAR)   See Function Navigator for Current Functional Status.   Therapy/Group: Individual Therapy  Luberta Mutter 12/23/2016, 3:26 PM

## 2016-12-23 NOTE — Progress Notes (Signed)
Occupational Therapy Session Note  Patient Details  Name: Kim Mcgee MRN: [id number] Date of Birth: 06-17-1940  Today's Date: 12/23/2016 OT Individual Time: 3007-6226 OT Individual Time Calculation (min): 61 min    Short Term Goals: Week 1:  OT Short Term Goal 1 (Week 1): Pt will be able to don LB clothing with AE with set up. OT Short Term Goal 2 (Week 1): Pt will be able to cleanse self post toileting with Supervision. OT Short Term Goal 3 (Week 1): Pt will be able to ambulate in and out of the bathroom with RW with min A. OT Short Term Goal 4 (Week 1): Pt will be able to tolerate standing for 10 min to enable her to make a snack in the kitchen.   Skilled Therapeutic Interventions/Progress Updates:    Pt received supine in bed, agreeable to OT tx session. Pt completes log roll technique with MinA and min verbal cues to come to sitting EOB. Pt able to verbalize 2/3 back precautions this session without assist. Focus of session on ADL/self-care retraining using AE and functional mobility transfers. Pt completes stand pivot transfer using RW/grab bars EOB>w/c, w/c<>toilet, and w/c<>tub bench with overall ModA and min verbal cues for technique/adhering to back precautions. Pt requires assist for peri hygiene after toileting. Pt requesting to complete bathing at shower level this session, washing all body parts with assist for back and buttocks, uses long handled sponge to reach LEs and min steady assist provided during standing. Pt completed dressing from w/c and sit<>stand level. Completes UB dressing with setup, LB dressing using reacher to don underwear/pants, steady assist provided in standing to pull underwear over hips, MinA for pulling pants over hips in standing. Min verbal cues for use of AE. Pt utilizes sock aide to don socks, requires minA to fully adjust socks once on feet. Completed seated grooming ADLs from w/c level at sink with min verbal cues for adhering to back precautions. Pt  left seated in w/c, call bell and needs within reach.   Therapy Documentation Precautions:  Precautions Precautions: Back, Fall Precaution Comments: Reviewed back precations Restrictions Weight Bearing Restrictions: No   Pain: Pain Assessment Pain Assessment: Faces Faces Pain Scale: No hurt ADL: ADL ADL Comments: refer to functional navigator  See Function Navigator for Current Functional Status.   Therapy/Group: Individual Therapy  [name] 12/23/2016, 8:48 AM

## 2016-12-23 NOTE — Progress Notes (Signed)
[place] PHYSICAL MEDICINE & REHABILITATION     PROGRESS NOTE    Subjective/Complaints: No new issues. Had a good night. Worked hard with therapy.   ROS: pt denies nausea, vomiting, diarrhea, cough, shortness of breath or chest pain       Objective: Vital Signs: Blood pressure 118/85, pulse 70, temperature 98 F (36.7 C), temperature source Oral, resp. rate 19, height 5\' 2"  (1.575 m), SpO2 98 %. Dg Chest 2 View  Result Date: 12/21/2016 CLINICAL DATA:  Shortness of breath EXAM: CHEST  2 VIEW COMPARISON:  12/17/2016 FINDINGS: Lower cervical hardware. Partially visualized thoracolumbar spinal rods and fixating screws. No acute consolidation or pleural effusion. Mild cardiomegaly. No pneumothorax. Degenerative osteophytes of the spine. IMPRESSION: 1. No acute infiltrate or edema 2. Borderline to mild cardiomegaly Electronically Signed   By: Donavan Foil M.D.   On: 12/21/2016 21:07    Recent Labs  12/22/16 0546  WBC 9.0  HGB 11.6*  HCT 36.3  PLT 269    Recent Labs  12/22/16 0546  NA 139  K 4.4  CL 103  GLUCOSE 136*  BUN 21*  CREATININE 0.66  CALCIUM 8.4*   CBG (last 3)  No results for input(s): GLUCAP in the last 72 hours.  Wt Readings from Last 3 Encounters:  12/15/16 74.4 kg (164 lb)  12/04/16 74.4 kg (164 lb)  07/30/14 75.3 kg (166 lb)    Physical Exam:  HENT:  Head: Normocephalicand atraumatic.  Eyes: Pupils are equal, round, and reactive to light. Conjunctivaeand EOMare normal.  Neck: Normal range of motion. Neck supple.  Cardiovascular: RRR without murmur. No JVD  Respiratory: CTA Bilaterally without wheezes or rales. Normal effort   GI: Soft. Bowel sounds are normal. She exhibits no distension. There is no tenderness.  Musculoskeletal: She exhibits no edemaor tenderness.  Neurological: She is alertand oriented to person, place, and time.  UE grossly 5/5. Decreased sensation below the waist, left more so than right. Motor: LLE 1+ to 1/5 prox to  distal--stable. RLE: 2 to 2+/5 prox to distal. DTR's 1+. --exam stable Skin: Skin is warmand dry.  Psychiatric: She has a normal mood and affect. Her behavior is normal. Judgmentand thought contentnormal.    Assessment/Plan: 1. 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 Physiatrist is providing close team supervision and 24 hour management of active medical problems listed below. Physiatrist and rehab team continue to assess barriers to discharge/monitor patient progress toward functional and medical goals.  Function:  Bathing Bathing position   Position: Shower  Bathing parts Body parts bathed by patient: Right arm, Left arm, Chest, Abdomen, Front perineal area, Right upper leg, Left upper leg, Right lower leg, Left lower leg (lower LEs using AE) Body parts bathed by helper: Back, Buttocks (bathing around dressing)  Bathing assist Assist Level: Touching or steadying assistance(Pt > 75%)      Upper Body Dressing/Undressing Upper body dressing   What is the patient wearing?: Pull over shirt/dress     Pull over shirt/dress - Perfomed by patient: Thread/unthread right sleeve, Thread/unthread left sleeve, Put head through opening, Pull shirt over trunk          Upper body assist Assist Level: Set up      Lower Body Dressing/Undressing Lower body dressing   What is the patient wearing?: Underwear, Pants, Non-skid slipper socks Underwear - Performed by patient: Thread/unthread right underwear leg, Thread/unthread left underwear  leg, Pull underwear up/down Underwear - Performed by helper: Thread/unthread right underwear leg, Thread/unthread left underwear leg Pants- Performed by patient: Thread/unthread right pants leg, Thread/unthread left pants leg Pants- Performed by helper: Pull pants up/down Non-skid slipper socks- Performed by patient: Don/doff right  sock, Don/doff left sock (50%) Non-skid slipper socks- Performed by helper: Don/doff right sock, Don/doff left sock Socks - Performed by patient: Don/doff right sock, Don/doff left sock     Shoes - Performed by helper: Don/doff right shoe, Don/doff left shoe          Lower body assist Assist for lower body dressing: Touching or steadying assistance (Pt > 75%)      Toileting Toileting   Toileting steps completed by patient: Adjust clothing prior to toileting, Adjust clothing after toileting Toileting steps completed by helper: Performs perineal hygiene Toileting Assistive Devices: Other (comment) (Bedside commode)  Toileting assist Assist level: Touching or steadying assistance (Pt.75%)   Transfers Chair/bed transfer   Chair/bed transfer method: Stand pivot Chair/bed transfer assist level: Moderate assist (Pt 50 - 74%/lift or lower) Chair/bed transfer assistive device: Armrests, Medical sales representative     Max distance: 25 Assist level: Touching or steadying assistance (Pt > 75%)   Wheelchair   Type: Manual Max wheelchair distance: 50 Assist Level: Supervision or verbal cues  Cognition Comprehension Comprehension assist level: Follows complex conversation/direction with extra time/assistive device  Expression Expression assist level: Expresses complex ideas: With extra time/assistive device  Social Interaction Social Interaction assist level: Interacts appropriately with others - No medications needed.  Problem Solving Problem solving assist level: Solves complex 90% of the time/cues < 10% of the time  Memory Memory assist level: More than reasonable amount of time   Medical Problem List and Plan: 1. Paraplegia and functional deficitssecondary to T12 myelopathy -continue therapies 2. DVT Prophylaxis/Anticoagulation: Mechanical: Sequential compression devices, below knee Bilateral lower extremities 3. Pain Management: Improving. Continue prn  hydrocodone/flexeril.  4. Mood: team to Provide ego support. LCSW to follow for evaluation and support.  5. Neuropsych: This patient iscapable of making decisions on herown behalf. 6. Skin/Wound Care: Routine pressure relief measures. Monitor wound daily.  7. Fluids/Electrolytes/Nutrition:encourage fluids  -improving intake.   8. HTN: Monitor BP bid. Continue Toprol.  9. H/o urinary incontinence/retention: has resolved--will monitor voiding with PVR checks x 24 hours. Continue Flomax (home med) 10. Chronic dysphagia/GERD: On Protonix with reflux precautions.  11. Leucocytosis: Likely reactive. UA neg. ucs pending  12. Hyperglycemia: Question due to stress v/s steroids.  c.  13. H/o Asthma with SOB: On dulera with albuterol prn. Encourage IS every hour.   -cxr unremarkable.   -encouraged IS/OOB 14. Anxiety d/o: Managed with Serax at nights--ativan substituted. Slept well.    LOS (Days) 2 A [place] T, MD 12/23/2016 9:28 AM

## 2016-12-23 NOTE — Progress Notes (Addendum)
Occupational Therapy Note  Patient Details  Name: Kim Mcgee MRN: [id number] Date of Birth: 1940/08/27  Today's Date: 12/23/2016 OT Individual Time: 1000-1100 OT Individual Time Calculation (min): 60 min   Pt c/o back pain (unrated); RN aware Individual Therapy  Pt engaged in dynamic standing activities with BUE use to complete table activities and folding of linens.  Pt required multiple rest breaks during activities.  Pt requires min verbal cues for safety awareness and sequencing with sit<>stand.  Focus on activity tolerance, standing balance, sit<>stand, and safety awareness to increase independence with BADLs.    Leotis Shames [facility] 12/23/2016, 11:06 AM

## 2016-12-24 ENCOUNTER — Inpatient Hospital Stay (HOSPITAL_COMMUNITY): Payer: Medicare Other | Admitting: Physical Therapy

## 2016-12-24 ENCOUNTER — Inpatient Hospital Stay (HOSPITAL_COMMUNITY): Payer: Medicare Other

## 2016-12-24 LAB — URINE CULTURE: Culture: NO GROWTH

## 2016-12-24 NOTE — Progress Notes (Signed)
Occupational Therapy Session Note  Patient Details  Name: Kim Mcgee MRN: [id number] Date of Birth: 1940-09-18  Today's Date: 12/24/2016 OT Individual Time: 1000-1100 OT Individual Time Calculation (min): 60 min    Short Term Goals: Week 1:  OT Short Term Goal 1 (Week 1): Pt will be able to don LB clothing with AE with set up. OT Short Term Goal 2 (Week 1): Pt will be able to cleanse self post toileting with Supervision. OT Short Term Goal 3 (Week 1): Pt will be able to ambulate in and out of the bathroom with RW with min A. OT Short Term Goal 4 (Week 1): Pt will be able to tolerate standing for 10 min to enable her to make a snack in the kitchen.   Skilled Therapeutic Interventions/Progress Updates:    Pt engaged in BADL retraining including bathing at shower level and dressing with sit<>stand from w/c at sink.  Pt amb with RW in/out of bathroom with steady A.  Pt used AE appropriately for doffing/donning clothing and bathing LB.  Pt required assistance with bathing buttocks thoroughly.  Pt used shoe funnel to don shoes but required assistance with L foot.  Pt performed sit<>stand X 8 during session without assistance but did require assistance with sit<>stand from low surface of tub bench.  Focus on functional amb with RW, sit<>stand, standing balance, activity tolerance, AE use, and safety awareness to increase independence with BADLs.   Therapy Documentation Precautions:  Precautions Precautions: Back, Fall Precaution Comments: Reviewed back precations Restrictions Weight Bearing Restrictions: No Pain: Pain Assessment Pain Score: 5 in back Shower and repositioned; RN aware  See Function Navigator for Current Functional Status.   Therapy/Group: Individual Therapy  Leroy Libman 12/24/2016, 12:07 PM

## 2016-12-24 NOTE — Progress Notes (Signed)
Physical Therapy Session Note  Patient Details  Name: Kim Mcgee MRN: [id number] Date of Birth: 1941-01-02  Today's Date: 12/24/2016 PT Individual Time: 0800-0915 PT Individual Time Calculation (min): 75 min   Short Term Goals: Week 1:  PT Short Term Goal 1 (Week 1): Pt will perform bed mobility minA PT Short Term Goal 2 (Week 1): Pt will perform transfers with S PT Short Term Goal 3 (Week 1): Pt will ambulate 100' with minA and LRAD PT Short Term Goal 4 (Week 1): Pt will initiate stair training  Skilled Therapeutic Interventions/Progress Updates:   Pt sitting up in bed upon arrival and agreeable to therapy, denies pain. PT assisted pt in changing clothes w/ Min A to don LE clothing and shoes.   Pt ambulated 54', 49', and 34' w/ touching/steady assist w/ RW. Seated rest breaks secondary to fatigue. Verbal cues to increase BOS while ambulating, pt able to self-correct towards end of gait bouts.   Dynamic standing balance w/ unilateral UE support and close supervision. Pt performing UE reaching outside of BOS to facilitate thoracic extension and increase independence w/ upright ADLs.   NuStep 12 min @ L1 to increase reciprocal movement pattern and increase functional LE strength.   Ended session in w/c in care of RN.   Therapy Documentation Precautions:  Precautions Precautions: Back, Fall Precaution Comments: Reviewed back precations Restrictions Weight Bearing Restrictions: No Vital Signs: Oxygen Therapy O2 Device: Not Delivered Pain: Pain Assessment Pain Score: 6  Pain Type: Surgical pain Pain Location: Back Pain Descriptors / Indicators: Aching Pain Intervention(s): Medication (See eMAR)  See Function Navigator for Current Functional Status.   Therapy/Group: Individual Therapy  [name] 12/24/2016, 9:06 AM

## 2016-12-24 NOTE — IPOC Note (Signed)
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[facility])     Functional Problem List: Nursing Medication Management, Pain, Skin Integrity, Safety  PT Balance, Endurance, Motor, Pain, Safety, Sensory, Skin Integrity  OT Balance, Endurance, Motor, Sensory, Pain  SLP    TR         Basic ADL's: OT Bathing, Dressing, Toileting     Advanced  ADL's: OT Simple Meal Preparation     Transfers: PT Bed Mobility, Bed to Chair, Car, Manufacturing systems engineer, Metallurgist: PT Ambulation, Emergency planning/management officer, Stairs     Additional Impairments: OT None  SLP        TR      Anticipated Outcomes Item Anticipated Outcome  Self Feeding Independent  Swallowing      Basic self-care  mod I dressing, S shower  Toileting  mod I    Bathroom Transfers supervision  Bowel/Bladder  Patient will be continent of bowel and bladder during admission  Transfers  modI  Locomotion  S with LRAD  Communication     Cognition     Pain  Patient will be pain free or pain < than 3 during admission  Safety/Judgment  Patient will be free from falls and adhere to the safety plan   Therapy Plan: PT Intensity: Minimum of 1-2 x/day ,45 to 90 minutes PT Frequency: 5 out of 7 days PT Duration Estimated Length of Stay: 10-12 days OT Intensity: Minimum of 1-2 x/day, 45 to 90 minutes OT Frequency: 5 out of 7 days OT Duration/Estimated Length of Stay: 10-12 days         Team Interventions: Nursing Interventions Patient/Family Education, Pain Management, Medication Management, Skin Care/Wound Management  PT interventions Ambulation/gait training, Discharge planning, Functional mobility training, Therapeutic Exercise, Therapeutic Activities, Psychosocial support, Wheelchair propulsion/positioning, Skin care/wound management, Neuromuscular  re-education, Disease management/prevention, Training and development officer, DME/adaptive equipment instruction, Pain management, UE/LE Strength taining/ROM, UE/LE Coordination activities, Stair training, Patient/family education, Community reintegration  OT Interventions Training and development officer, Academic librarian, Discharge planning, Engineer, drilling, Functional mobility training, Neuromuscular re-education, Pain management, Patient/family education, Psychosocial support, Self Care/advanced ADL retraining, Therapeutic Activities, Therapeutic Exercise, UE/LE Strength taining/ROM  SLP Interventions    TR Interventions    SW/CM Interventions Discharge Planning, Psychosocial Support, Patient/Family Education    Team Discharge Planning: Destination: PT-Home ,OT- Home , SLP-  Projected Follow-up: PT-Outpatient PT, 24 hour supervision/assistance, OT-  Home health OT, SLP-  Projected Equipment Needs: PT- , OT- To be determined, SLP-  Equipment Details: PT-has RW, w/c, cane, OT-  Patient/family involved in discharge planning: PT- Patient,  OT-Patient, SLP-   MD ELOS: 10-12 days Medical Rehab Prognosis:  Excellent Assessment: The patient has been admitted for CIR therapies with the diagnosis of lumbar radiculopathies s/p lumbar spine decompression. The team will be addressing functional mobility, strength, stamina, balance, safety, adaptive techniques and equipment, self-care, bowel and bladder mgt, patient and caregiver education, orthotics, pain control, community reentry, ego suppor. Goals have been set at mod I to supervision with self-care and mobility. Pt is quite motivated.     Meredith Staggers, MD, FAAPMR      See Team Conference Notes for weekly updates to the plan of care

## 2016-12-24 NOTE — Progress Notes (Signed)
[place] PHYSICAL MEDICINE & REHABILITATION     PROGRESS NOTE    Subjective/Complaints: Feeling well. Working hard in therapies  ROS: pt denies nausea, vomiting, diarrhea, cough, shortness of breath or chest pain       Objective: Vital Signs: Blood pressure (!) 189/68, pulse 61, temperature 97.7 F (36.5 C), temperature source Oral, resp. rate 18, height 5\' 2"  (1.575 m), weight 77.3 kg (170 lb 8 oz), SpO2 95 %. No results found.  Recent Labs  12/22/16 0546  WBC 9.0  HGB 11.6*  HCT 36.3  PLT 269    Recent Labs  12/22/16 0546  NA 139  K 4.4  CL 103  GLUCOSE 136*  BUN 21*  CREATININE 0.66  CALCIUM 8.4*   CBG (last 3)  No results for input(s): GLUCAP in the last 72 hours.  Wt Readings from Last 3 Encounters:  12/24/16 77.3 kg (170 lb 8 oz)  12/15/16 74.4 kg (164 lb)  12/04/16 74.4 kg (164 lb)    Physical Exam:  HENT:  Head: Normocephalicand atraumatic.  Eyes: Pupils are equal, round, and reactive to light. Conjunctivaeand EOMare normal.  Neck: Normal range of motion. Neck supple.  Cardiovascular: RRR without murmur. No JVD   Respiratory: CTA Bilaterally without wheezes or rales. Normal effort  GI: Soft. Bowel sounds are normal. She exhibits no distension. There is no tenderness.  Musculoskeletal: She exhibits no edemaor tenderness.  Neurological: She is alertand oriented to person, place, and time.  UE grossly 5/5. Decreased sensation below the waist, left more so than right--no changes.   Motor: LLE 1+ to 1/5 prox to distal--stable. RLE: 2 to 2+/5 prox to distal. DTR's 1+. --motor exam stable  Skin: Skin is warmand dry.  Psychiatric: She has a normal mood and affect. Her behavior is normal. Judgmentand thought contentnormal.    Assessment/Plan: 1. 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 Physiatrist is providing close  team supervision and 24 hour management of active medical problems listed below. Physiatrist and rehab team continue to assess barriers to discharge/monitor patient progress toward functional and medical goals.  Function:  Bathing Bathing position   Position: Shower  Bathing parts Body parts bathed by patient: Right arm, Left arm, Chest, Abdomen, Front perineal area, Right upper leg, Left upper leg, Right lower leg, Left lower leg (lower LEs using AE) Body parts bathed by helper: Back, Buttocks (bathing around dressing)  Bathing assist Assist Level: Touching or steadying assistance(Pt > 75%)      Upper Body Dressing/Undressing Upper body dressing   What is the patient wearing?: Pull over shirt/dress     Pull over shirt/dress - Perfomed by patient: Thread/unthread right sleeve, Thread/unthread left sleeve, Put head through opening, Pull shirt over trunk          Upper body assist Assist Level: Set up      Lower Body Dressing/Undressing Lower body dressing   What is the patient wearing?: Underwear, Pants, Non-skid slipper socks Underwear - Performed by patient: Thread/unthread right underwear leg, Thread/unthread left underwear leg, Pull underwear up/down Underwear - Performed by helper: Thread/unthread right underwear leg, Thread/unthread left underwear leg Pants- Performed by patient: Thread/unthread right pants leg, Thread/unthread left pants leg Pants- Performed by helper: Pull pants up/down Non-skid slipper socks- Performed by patient: Don/doff right sock, Don/doff left sock (50%) Non-skid slipper socks- Performed by helper: Don/doff right sock, Don/doff left sock Socks - Performed by  patient: Don/doff right sock, Don/doff left sock     Shoes - Performed by helper: Don/doff right shoe, Don/doff left shoe          Lower body assist Assist for lower body dressing: Touching or steadying assistance (Pt > 75%)      Toileting Toileting   Toileting steps completed by  patient: Performs perineal hygiene Toileting steps completed by helper: Adjust clothing prior to toileting, Performs perineal hygiene, Adjust clothing after toileting Toileting Assistive Devices: Grab bar or rail  Toileting assist Assist level: Touching or steadying assistance (Pt.75%)   Transfers Chair/bed transfer   Chair/bed transfer method: Stand pivot Chair/bed transfer assist level: Touching or steadying assistance (Pt > 75%) Chair/bed transfer assistive device: Armrests, Medical sales representative     Max distance: 59' Assist level: Touching or steadying assistance (Pt > 75%)   Wheelchair   Type: Manual Max wheelchair distance: 50 Assist Level: Supervision or verbal cues  Cognition Comprehension Comprehension assist level: Follows complex conversation/direction with extra time/assistive device  Expression Expression assist level: Expresses complex ideas: With extra time/assistive device  Social Interaction Social Interaction assist level: Interacts appropriately with others - No medications needed.  Problem Solving Problem solving assist level: Solves complex 90% of the time/cues < 10% of the time  Memory Memory assist level: More than reasonable amount of time   Medical Problem List and Plan: 1. Paraplegia and functional deficitssecondary to T12 myelopathy -continue PT, OT 2. DVT Prophylaxis/Anticoagulation: Mechanical: Sequential compression devices, below knee Bilateral lower extremities 3. Pain Management: Improving. Continue prn hydrocodone/flexeril.  4. Mood: team to Provide ego support. LCSW to follow for evaluation and support.  5. Neuropsych: This patient iscapable of making decisions on herown behalf. 6. Skin/Wound Care: Routine pressure relief measures. Monitor wound daily.  7. Fluids/Electrolytes/Nutrition:encourage fluids  -improving intake.   8. HTN: Monitor BP bid. Continue Toprol.  9. H/o urinary incontinence/retention:   Continue Flomax (home med)  -emptying with low pvr's.  10. Chronic dysphagia/GERD: On Protonix with reflux precautions.  11. Leucocytosis: Likely reactive. UA neg. ucs negative  12. Hyperglycemia: Question due to stress v/s steroids.  c.  13. H/o Asthma with SOB: On dulera with albuterol prn. Encourage IS every hour.   -cxr unremarkable.   -encouraged IS/OOB 14. Anxiety d/o: Managed with Serax at nights--ativan substituted. Slept well. 15. Neurogenic bowel: had large BM this morning    LOS (Days) 3 A FACE TO FACE EVALUATION WAS PERFORMED  Meredith Staggers, MD 12/24/2016 9:45 AM

## 2016-12-24 NOTE — Progress Notes (Signed)
Physical Therapy Session Note  Patient Details  Name: Kim Mcgee MRN: [id number] Date of Birth: [birth year]  Today's Date: 12/24/2016 PT Individual Time: 1300-1400 PT Individual Time Calculation (min): 60 min   Short Term Goals: Week 1:  PT Short Term Goal 1 (Week 1): Pt will perform bed mobility minA PT Short Term Goal 2 (Week 1): Pt will perform transfers with S PT Short Term Goal 3 (Week 1): Pt will ambulate 100' with minA and LRAD PT Short Term Goal 4 (Week 1): Pt will initiate stair training  Skilled Therapeutic Interventions/Progress Updates: Pt received seated in w/c, c/o pain as below and agreeable to treatment. Gait into bathroom with min guard with RW. Requires assist for clothing management and hygiene. [place] sit >stand from toilet d/t report of increased fatigue and difficulty standing with less assist. Stand pivot transfer min guard for pivotal steps back to w/c. Hand hygiene performed at sink in sitting. Gait 2x40' with RW and min guard; slow speed and forward flexed posture requiring ongoing cueing to correct. Standing balance in parallel bars with focus on BLE coordination; performed tapping to numbered targets, soccer ball kicks and step taps to 1" step for facilitation of LE coordination, weight shifting, LE strengthening and endurance; repetitive cues throughout for upright posture and reduced weight bearing through UEs. Gait to return to room with min guard x75' before fatigued. Returned to room totalA in w/c; remained seated in w/c at end of session, all needs in reach.      Therapy Documentation Precautions:  Precautions Precautions: Back, Fall Precaution Comments: Reviewed back precations Restrictions Weight Bearing Restrictions: No Pain: Pain Assessment Pain Score: 6  Pain Type: Surgical pain Pain Location: Back Pain Descriptors / Indicators: Aching;Discomfort Pain Intervention(s): Medication (See eMAR)   See Function Navigator for Current Functional  Status.   Therapy/Group: Individual Therapy  Luberta Mutter 12/24/2016, 1:55 PM

## 2016-12-25 DIAGNOSIS — R7303 Prediabetes: Secondary | ICD-10-CM

## 2016-12-25 DIAGNOSIS — R0989 Other specified symptoms and signs involving the circulatory and respiratory systems: Secondary | ICD-10-CM | POA: Insufficient documentation

## 2016-12-25 DIAGNOSIS — I10 Essential (primary) hypertension: Secondary | ICD-10-CM

## 2016-12-25 NOTE — Care Management Note (Signed)
Inpatient [facility] Individual Statement of Services  Patient Name:  Kim Mcgee  Date:  12/25/2016  Welcome to the [facility].  Our goal is to provide you with an individualized program based on your diagnosis and situation, designed to meet your specific needs.  With this comprehensive rehabilitation program, you will be expected to participate in at least 3 hours of rehabilitation therapies Monday-Friday, with modified therapy programming on the weekends.  Your rehabilitation program will include the following services:  Physical Therapy (PT), Occupational Therapy (OT), 24 hour per day rehabilitation nursing, Case Management (Social Worker), Rehabilitation Medicine, Nutrition Services and Pharmacy Services  Weekly team conferences will be held on Tuesdays to discuss your progress.  Your Social Worker will talk with you frequently to get your input and to update you on team discussions.  Team conferences with you and your family in attendance may also be held.  Expected length of stay: 10-13 days  Overall anticipated outcome: supervision  Depending on your progress and recovery, your program may change. Your Social Worker will coordinate services and will keep you informed of any changes. Your Social Worker's name and contact numbers are listed  below.  The following services may also be recommended but are not provided by the [facility] will be made to provide these services after discharge if needed.  Arrangements include referral to agencies that provide these services.  Your insurance has been verified to be:  Medicare and [place] Your primary doctor is:  Vito Berger  Pertinent information will be shared with your doctor and your insurance company.  Social Worker:  [place], [place] or ([phone]   Information  discussed with and copy given to patient by: Lennart Pall, 12/25/2016, 10:33 AM

## 2016-12-25 NOTE — Progress Notes (Signed)
Social Work  Social Work Assessment and Plan  Patient Details  Name: Kim Mcgee MRN: [id number] Date of Birth: 03/30/1941  Today's Date: 12/24/2016  Problem List:  Patient Active Problem List   Diagnosis Date Noted  . 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 Back pain   . Confusion, postoperative   . Laryngospasm   . Neuropathic pain   . Steroid-induced hyperglycemia   . Post-op pain   . Leukocytosis   . Dyspnea 12/17/2016  . Hypothyroidism 12/17/2016  . Hypertension 12/17/2016  . Coronary artery disease 12/17/2016  . High cholesterol 12/17/2016  . 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 S/P lumbar laminectomy 12/15/2016  . Lumbar spinal stenosis 07/30/2014  . Chest pain 02/11/2012  . Spinal stenosis 02/10/2012  . Hypoxemia 02/10/2012  . H/O laminectomy 02/10/2012  . GERD (gastroesophageal reflux disease) 02/10/2012  . Vocal cord dysfunction 02/10/2012  . Shortness of breath 02/09/2012   Past Medical History:  Past Medical History:  Diagnosis Date  . Anxiety   . Arthritis    lumbar DDD  . Asthma   . Basal cell carcinoma of nose   . Chronic lower back pain   . Coronary artery disease   . Dysrhythmia   . GERD (gastroesophageal reflux disease)   . High cholesterol   . Hypertension   . Hypothyroidism   . Normal cardiac stress test   . PONV (postoperative nausea and vomiting)   . Shortness of breath    /w walking long distance   . Swallowing difficulty    being seen by ENT- had endoscopy- told that its probably related to past neck surgeries    Past Surgical History:  Past Surgical History:  Procedure Laterality Date  . ANTERIOR CERVICAL DECOMP/DISCECTOMY FUSION  2010  . APPENDECTOMY    . BACK SURGERY    . BASAL CELL CARCINOMA EXCISION     "cut off nose"  . CARDIAC CATHETERIZATION  1970s; 1990s?; 2017  . CATARACT EXTRACTION W/ INTRAOCULAR LENS  IMPLANT, BILATERAL Bilateral   . CORONARY ANGIOPLASTY WITH STENT PLACEMENT  ~ 2017  . DECOMPRESSIVE LUMBAR  LAMINECTOMY LEVEL 1 N/A 12/15/2016   Procedure: DECOMPRESSIVE  LAMINECTOMY Thoracic twelve to lumbar one;  Surgeon: Kary Kos, MD;  Location: [facility];  Service: Neurosurgery;  Laterality: N/A;  . DILATION AND CURETTAGE OF UTERUS    . OOPHORECTOMY     "?side"  . POSTERIOR FUSION LUMBAR SPINE  ~ 2013; 2016  . POSTERIOR LUMBAR FUSION 4 LEVEL N/A 12/15/2016   Procedure: Posterior Fusion Thoracic- ten to Lumbar- two;  Surgeon: Kary Kos, MD;  Location: [facility];  Service: Neurosurgery;  Laterality: N/A;  . TONSILLECTOMY    . TOTAL THYROIDECTOMY  1990's  . TUBAL LIGATION     Social History:  reports that she has never smoked. She has never used smokeless tobacco. She reports that she does not drink alcohol or use drugs.  Family / Support Systems Marital Status: Married How Long?: 42 yrs Patient Roles: Spouse, Parent, Other (Comment) (grandparent) Spouse/Significant Other: Charliegh Vasudevan @ (H) [phone] or [phone] Children: daughter. Luanne Cherochak @ (C) [phone] and daughter, Cherre Blanc @ [phone] - local Anticipated Caregiver: husband Ability/Limitations of Caregiver: Husband is retired and can assist.  Dtr, Lorenda Ishihara, currently staying with patient and can assist as well. Caregiver Availability: 24/7 Family Dynamics: Husband and daughter present during interview and very supportive and encouraging to pt.  Deny any concerns about assisting at d/c.  Social History Preferred language: English Religion: Catholic  Cultural Background: NA Education: HS Read: Yes Write: Yes Employment Status: Retired Freight forwarder Issues: None Guardian/Conservator: None - per MD, pt is capable of making decisions on her own behalf   Abuse/Neglect Physical Abuse: Denies Verbal Abuse: Denies Sexual Abuse: Denies Exploitation of patient/patient's resources: Denies Self-Neglect: Denies  Emotional Status Pt's affect, behavior adn adjustment status: Pt very pleasant,  talkative and completes assessment interview without difficulty.  She admits frustration with limitations but denies any significant emotional distress.  Overall pleased with coming to CIR and optimistic about gains to be made. Recent Psychosocial Issues: None Pyschiatric History: None Substance Abuse History: None  Patient / Family Perceptions, Expectations & Goals Pt/Family understanding of illness & functional limitations: Pt and family with good, basic understanding of extensive surgery performed, current functional limitations/ need for CIR.  Notes she had received tx at a local SNF after prior surgery but felt she wanted "more intense therapy" this time. Premorbid pt/family roles/activities: Had been relatively independent until a few weeks prior to surgery. Anticipated changes in roles/activities/participation: little change anticipated if pt able to reach supervision goals as spouse and daughter are able to provide this. Pt/family expectations/goals: Pt goals are to be as independent as possible  Recruitment consultant: None Premorbid Home Care/DME Agencies: None (went to SNF for tx after prior surgery) Transportation available at discharge: yes  Discharge Planning Living Arrangements: Spouse/significant other Support Systems: Spouse/significant other, Children Type of Residence: Private residence Insurance underwriter Resources: [employer], Multimedia programmer (specify) Nurse, mental health) Financial Resources: [place] Referred: No Living Expenses: Own Money Management: Spouse Does the patient have any problems obtaining your medications?: No Home Management: pt and family Patient/Family Preliminary Plans: Pt to d/c home with family able to provide 24/7 supervision Social Work Anticipated Follow Up Needs: HH/OP Expected length of stay: 10-13 days  Clinical Impression Very pleasant, talkative woman here following extensive back surgery and with husband and daughter  at bedside. She is pleased to be on CIR and optimistic about her recovery.  Denies any emotional distress - will monitor.  Family able to provide 24/7 support.  Will follow for support and d/c planning needs.  Giuliano Preece 12/24/2016, 10:31 AM

## 2016-12-25 NOTE — Progress Notes (Signed)
[facility] PHYSICAL MEDICINE & REHABILITATION     PROGRESS NOTE    Subjective/Complaints: Pt seen laying in bed this AM.  She slept well overnight.  She has questions about her blood pressure and wants to ensure it is being monitored.   ROS: Denies nausea, vomiting, diarrhea, shortness of breath or chest pain       Objective: Vital Signs: Blood pressure (!) 175/58, pulse (!) 56, temperature 98.6 F (37 C), temperature source Oral, resp. rate 16, height 5\' 2"  (1.575 m), weight 77.3 kg (170 lb 8 oz), SpO2 96 %. No results found. No results for input(s): WBC, HGB, HCT, PLT in the last 72 hours. No results for input(s): NA, K, CL, GLUCOSE, BUN, CREATININE, CALCIUM in the last 72 hours.  Invalid input(s): CO CBG (last 3)  No results for input(s): GLUCAP in the last 72 hours.  Wt Readings from Last 3 Encounters:  12/24/16 77.3 kg (170 lb 8 oz)  12/15/16 74.4 kg (164 lb)  12/04/16 74.4 kg (164 lb)    Physical Exam:  Gen: NAD. Vital signs reviewed.  HENT: Normocephalicand atraumatic.  Eyes: EOMI. No discharge.  Cardiovascular: RRR. No JVD  Respiratory: CTA Bilaterally. Normal effort   GI: Soft. Bowel sounds are normal.  Musculoskeletal: She exhibits no edemaor tenderness.  Neurological: She is alertand oriented.  Motor: B/l UE 5/5 proximal to distal.  LLE 1+/5 prox to distal RLE: 2+/5 prox to distal.  Skin: Skin is warmand dry.  Psychiatric: She has a normal mood and affect. Her behavior is normal. Judgmentand thought contentnormal.    Assessment/Plan: 1. 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 Physiatrist is providing close team supervision and 24 hour management of active medical problems listed below. Physiatrist and rehab team continue to assess barriers to discharge/monitor patient progress toward functional and medical  goals.  Function:  Bathing Bathing position   Position: Shower  Bathing parts Body parts bathed by patient: Right arm, Left arm, Chest, Abdomen, Front perineal area, Right upper leg, Left upper leg, Right lower leg, Left lower leg Body parts bathed by helper: Back, Buttocks  Bathing assist Assist Level: Touching or steadying assistance(Pt > 75%)      Upper Body Dressing/Undressing Upper body dressing   What is the patient wearing?: Pull over shirt/dress     Pull over shirt/dress - Perfomed by patient: Thread/unthread right sleeve, Thread/unthread left sleeve, Put head through opening, Pull shirt over trunk          Upper body assist Assist Level: Set up      Lower Body Dressing/Undressing Lower body dressing   What is the patient wearing?: Underwear, Pants, Socks, Shoes Underwear - Performed by patient: Thread/unthread right underwear leg, Thread/unthread left underwear leg, Pull underwear up/down Underwear - Performed by helper: Thread/unthread right underwear leg, Thread/unthread left underwear leg Pants- Performed by patient: Thread/unthread right pants leg, Thread/unthread left pants leg, Pull pants up/down, Fasten/unfasten pants Pants- Performed by helper: Pull pants up/down Non-skid slipper socks- Performed by patient: Don/doff right sock, Don/doff left sock (50%) Non-skid slipper socks- Performed by helper: Don/doff right sock, Don/doff left sock Socks - Performed by patient: Don/doff right sock, Don/doff left sock     Shoes - Performed by helper: Don/doff right shoe, Don/doff left shoe          Lower body assist Assist for lower body dressing: Touching or steadying assistance (Pt > 75%)  Toileting Toileting   Toileting steps completed by patient: Adjust clothing prior to toileting, Performs perineal hygiene, Adjust clothing after toileting Toileting steps completed by helper: Adjust clothing prior to toileting, Adjust clothing after toileting Toileting  Assistive Devices: Grab bar or rail  Toileting assist Assist level: Touching or steadying assistance (Pt.75%)   Transfers Chair/bed transfer   Chair/bed transfer method: Stand pivot Chair/bed transfer assist level: Touching or steadying assistance (Pt > 75%) Chair/bed transfer assistive device: Armrests, Medical sales representative     Max distance: 63' Assist level: Touching or steadying assistance (Pt > 75%)   Wheelchair   Type: Manual Max wheelchair distance: 50 Assist Level: Supervision or verbal cues  Cognition Comprehension Comprehension assist level: Follows complex conversation/direction with extra time/assistive device  Expression Expression assist level: Expresses complex ideas: With extra time/assistive device  Social Interaction Social Interaction assist level: Interacts appropriately with others - No medications needed.  Problem Solving Problem solving assist level: Solves complex problems: With extra time  Memory Memory assist level: More than reasonable amount of time   Medical Problem List and Plan: 1. Paraplegia and functional deficitssecondary to T12 myelopathy  Cont CIR  Notes reviewed 2. DVT Prophylaxis/Anticoagulation: Mechanical: Sequential compression devices, below knee Bilateral lower extremities 3. Pain Management: Improving. Continue prn hydrocodone/flexeril.  4. Mood: team to Provide ego support. LCSW to follow for evaluation and support.  5. Neuropsych: This patient iscapable of making decisions on herown behalf. 6. Skin/Wound Care: Routine pressure relief measures. Monitor wound daily.  7. Fluids/Electrolytes/Nutrition:encourage fluids  -improving intake.   8. HTN: Monitor BP bid. Continue Toprol.   Extremely labile at present, will check orthostatics 9. H/o urinary incontinence/retention: has resolved. Continue Flomax (home med)  PVR unremarkable. 10. Chronic dysphagia/GERD: On Protonix. 11. Leucocytosis: Resolved  UA neg. ucs  NG 12. Prediabetes:   Cont to monitor 13. H/o Asthma with SOB: On dulera with albuterol prn. Encourage IS every hour.   -cxr unremarkable.   -encouraged IS/OOB 14. Anxiety d/o: Managed with Serax at nights--ativan substituted. Slept well.    LOS (Days) 4 A FACE TO FACE EVALUATION WAS PERFORMED  Ankit Lorie Phenix, MD 12/25/2016 11:45 AM

## 2016-12-26 ENCOUNTER — Inpatient Hospital Stay (HOSPITAL_COMMUNITY): Payer: Medicare Other | Admitting: Occupational Therapy

## 2016-12-26 ENCOUNTER — Inpatient Hospital Stay (HOSPITAL_COMMUNITY): Payer: Medicare Other

## 2016-12-26 ENCOUNTER — Inpatient Hospital Stay (HOSPITAL_COMMUNITY): Payer: Medicare Other | Admitting: Physical Therapy

## 2016-12-26 MED ORDER — LISINOPRIL 5 MG PO TABS
5.0000 mg | ORAL_TABLET | Freq: Every day | ORAL | Status: DC
  Administered 2016-12-26 – 2016-12-31 (×6): 5 mg via ORAL
  Filled 2016-12-26 (×6): qty 1

## 2016-12-26 NOTE — Progress Notes (Signed)
Physical Therapy Note  Patient Details  Name: Kim Mcgee MRN: [id number] Date of Birth: Nov 03, 1940 Today's Date: 12/26/2016    Time: [phone] 45 minutes  1:1 No c/o pain.  Pt missed initial 15 minutes of session due to late breakfast tray.  Pt performed toilet transfer with min A.  Gait training with RW with close supervision x 100' with slow speed, cues for upright posture.  Standing step ups to 4'' and 2'' step with pt relying heavily on UEs, pt able to lift Rt LE up on step but unable to step up with Lt. Pt unable to lift Rt or Lt foot off of step but able to slide feet off of step at 4'' and 2''.  Pt fatigues quickly with this activity and requires frequent rests.  Standing hip abd/add with pt unable to lift and perform abduction with Rt but able to abduct Lt LE in standing. able to adduct bilat LEs in standing.  Pt requires frequent rests due to LE fatigue but is happy with progress.   DONAWERTH,KAREN 12/26/2016, 9:57 AM

## 2016-12-26 NOTE — Progress Notes (Signed)
Occupational Therapy Session Note  Patient Details  Name: Kim Mcgee MRN: [id number] Date of Birth: [birth year]  Today's Date: 12/26/2016 OT Individual Time: 0700-0800 and 1130-1200 OT Individual Time Calculation (min): 60 min and 30 min   Short Term Goals: Week 1:  OT Short Term Goal 1 (Week 1): Pt will be able to don LB clothing with AE with set up. OT Short Term Goal 2 (Week 1): Pt will be able to cleanse self post toileting with Supervision. OT Short Term Goal 3 (Week 1): Pt will be able to ambulate in and out of the bathroom with RW with min A. OT Short Term Goal 4 (Week 1): Pt will be able to tolerate standing for 10 min to enable her to make a snack in the kitchen.   Skilled Therapeutic Interventions/Progress Updates:    Session One: Pt seen for OT ADL bathing/dressing session. Pt in supine upon arrival, easily awoken and agreeable to tx session.  She ambulated throughout session with RW and guarding assist. She gathered clothing items from srawers, min cuing for remembering back pre cautions with functional tasks. She completed toileting task with supervision. Doffed clothing with assist of reacher. She bathed seated on tub bench, standing with use of grab bars to complete pericare and assist provided for buttock hygiene. She returned to w/c to dress, using AE to assist. Educated regarding shoe funnel and assist provided to use shoe funnel, able to don shoes independently following instruction. Pt left sitting up in w/c at end of session, all needs in reach and NT present.   Session Two: Pt seen for OT session focusing on functional transfers. Pt sitting up in w/c upon arrival, agreeable to tx session. Pt taken to ADL apartment total A in w/c for time and energy conservation.  Demonstration and education provided regarding technique for entering/ exiting from walk in shower using RW in prep for home environment. Pt completed with min A, requiring significantly increased time and  effort in order to raise foot over threadshold, increased difficulty R> L. Discussed shower layout at home and moving shower chair from where she previously had it. Pt will benefit from cont practice, if she cont to have difficulty clearing foot may recommend tub/bench to cross threshold as pt at high fall risk with decreased foot clearance.  She ambulated ~49ft to ADL apartment before requiring seated rest break. VCs for upright posture with less UE reliance on RW. Pt returned to room and left seated in w/c with all needs in reach and RN present.   Therapy Documentation Precautions:  Precautions Precautions: Back, Fall Precaution Comments: Reviewed back precations Restrictions Weight Bearing Restrictions: No Pain:   No/ denies pain. Reports being pre-medicated prior to tx session.  ADL: ADL ADL Comments: refer to functional navigator  See Function Navigator for Current Functional Status.   Therapy/Group: Individual Therapy  Lewis, Jackob [place]ok[place] C 12/26/2016, 6:26 AM

## 2016-12-26 NOTE — Progress Notes (Signed)
Occupational Therapy Session Note  Patient Details  Name: Kim Mcgee MRN: [id number] Date of Birth: 06/05/1940  Today's Date: 12/26/2016 OT Individual Time: 1330-1415 OT Individual Time Calculation (min): 45 min    Skilled Therapeutic Interventions/Progress Updates:    1:1, No c/o pain. Pt received on toilet voiding bladder. Pt able to complete hygiene and clothing management with touching A for balance and VC for back precautions. Pt transfers back to w/c with touching A and use of grab bar. Pt washes hands and propels w/c to gym with supervision. Pt bowls at sit to stand without AD level with touching A for balance and VC for foot placement and forward weight shifting on feet when upright to improve balance and strength required for BADLs. Pt keeps score without VC. Pt completes stand pivot transfer w/c>EOB with RW with MIN A and EOB>supine with MOD A and VC for log rolling technique. Exited session with pt semi reclined in bed with call light in reach and all needs met.   Therapy Documentation Precautions:  Precautions Precautions: Back, Fall Precaution Comments: Reviewed back precations Restrictions Weight Bearing Restrictions: No  See Function Navigator for Current Functional Status.   Therapy/Group: Individual Therapy  Tonny Branch 12/26/2016, 3:01 PM

## 2016-12-26 NOTE — Progress Notes (Signed)
[facility]ICINE & REHABILITATION     PROGRESS NOTE    Subjective/Complaints: Pt seen laying in bed this AM.  She slept well overnight.  She continues to have concerns about her BP being monitored.   ROS: Denies nausea, vomiting, diarrhea, shortness of breath or chest pain       Objective: Vital Signs: Blood pressure (!) 178/68, pulse 61, temperature 98 F (36.7 C), temperature source Oral, resp. rate 18, height 5\' 2"  (1.575 m), weight 77.3 kg (170 lb 8 oz), SpO2 97 %. No results found. No results for input(s): WBC, HGB, HCT, PLT in the last 72 hours. No results for input(s): NA, K, CL, GLUCOSE, BUN, CREATININE, CALCIUM in the last 72 hours.  Invalid input(s): CO CBG (last 3)  No results for input(s): GLUCAP in the last 72 hours.  Wt Readings from Last 3 Encounters:  12/24/16 77.3 kg (170 lb 8 oz)  12/15/16 74.4 kg (164 lb)  12/04/16 74.4 kg (164 lb)    Physical Exam:  Gen: NAD. Vital signs reviewed.  HENT: Normocephalicand atraumatic.  Eyes: EOMI. No discharge.  Cardiovascular: RRR. No JVD  Respiratory: CTA Bilaterally. Normal effort   GI: Soft. Bowel sounds are normal.  Musculoskeletal: She exhibits no edemaor tenderness.  Neurological: She is alertand oriented.  Motor: B/l UE 5/5 proximal to distal.  LLE: 1+/5 prox to distal (stable). RLE: 2+/5 prox to distal.  Skin: Skin is warmand dry.  Psychiatric: She has a normal mood and affect. Her behavior is normal. Judgmentand thought contentnormal.    Assessment/Plan: 1. 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 Physiatrist is providing close team supervision and 24 hour management of active medical problems listed below. Physiatrist and rehab team continue to assess barriers to discharge/monitor patient progress toward functional and medical  goals.  Function:  Bathing Bathing position   Position: Shower  Bathing parts Body parts bathed by patient: Right arm, Left arm, Chest, Abdomen, Front perineal area, Right upper leg, Left upper leg, Right lower leg, Left lower leg Body parts bathed by helper: Buttocks, Back  Bathing assist Assist Level: Touching or steadying assistance(Pt > 75%)      Upper Body Dressing/Undressing Upper body dressing   What is the patient wearing?: Pull over shirt/dress     Pull over shirt/dress - Perfomed by patient: Thread/unthread right sleeve, Thread/unthread left sleeve, Put head through opening, Pull shirt over trunk          Upper body assist Assist Level: More than reasonable time      Lower Body Dressing/Undressing Lower body dressing   What is the patient wearing?: Underwear, Pants, Socks, Shoes Underwear - Performed by patient: Thread/unthread right underwear leg, Thread/unthread left underwear leg, Pull underwear up/down Underwear - Performed by helper: Thread/unthread right underwear leg, Thread/unthread left underwear leg Pants- Performed by patient: Thread/unthread right pants leg, Thread/unthread left pants leg, Pull pants up/down, Fasten/unfasten pants Pants- Performed by helper: Pull pants up/down Non-skid slipper socks- Performed by patient: Don/doff right sock, Don/doff left sock (50%) Non-skid slipper socks- Performed by helper: Don/doff right sock, Don/doff left sock Socks - Performed by patient: Don/doff right sock, Don/doff left sock     Shoes - Performed by helper: Don/doff right shoe, Don/doff left shoe          Lower body assist Assist for lower body dressing: Touching or steadying assistance (Pt > 75%)  Toileting Toileting   Toileting steps completed by patient: Adjust clothing prior to toileting, Performs perineal hygiene, Adjust clothing after toileting Toileting steps completed by helper: Performs perineal hygiene, Adjust clothing after toileting,  Adjust clothing prior to toileting Toileting Assistive Devices: Grab bar or rail  Toileting assist Assist level: Touching or steadying assistance (Pt.75%)   Transfers Chair/bed transfer   Chair/bed transfer method: Stand pivot Chair/bed transfer assist level: Touching or steadying assistance (Pt > 75%) Chair/bed transfer assistive device: Armrests, Medical sales representative     Max distance: 19' Assist level: Touching or steadying assistance (Pt > 75%)   Wheelchair   Type: Manual Max wheelchair distance: 50 Assist Level: Supervision or verbal cues  Cognition Comprehension Comprehension assist level: Follows complex conversation/direction with extra time/assistive device  Expression Expression assist level: Expresses complex ideas: With extra time/assistive device  Social Interaction Social Interaction assist level: Interacts appropriately with others - No medications needed.  Problem Solving Problem solving assist level: Solves complex problems: With extra time  Memory Memory assist level: More than reasonable amount of time   Medical Problem List and Plan: 1. Paraplegia and functional deficitssecondary to T12 myelopathy  Cont CIR 2. DVT Prophylaxis/Anticoagulation: Mechanical: Sequential compression devices, below knee Bilateral lower extremities 3. Pain Management: Improving. Continue prn hydrocodone/flexeril.  4. Mood: team to Provide ego support. LCSW to follow for evaluation and support.  5. Neuropsych: This patient iscapable of making decisions on herown behalf. 6. Skin/Wound Care: Routine pressure relief measures. Monitor wound daily.  7. Fluids/Electrolytes/Nutrition:encourage fluids  -Good intake.   8. HTN: Monitor BP bid. Continue Toprol.   Trending up, Lisinopril 5 started 7/29  Orthostatics neg 9. H/o urinary incontinence/retention: has resolved. Continue Flomax (home med)  PVR unremarkable. 10. Chronic dysphagia/GERD: On Protonix. 11.  Leucocytosis: Resolved  UA neg. ucs NG 12. Prediabetes:   Cont to monitor 13. H/o Asthma with SOB: On dulera with albuterol prn. Encourage IS every hour.   -cxr unremarkable.   -encouraged IS/OOB 14. Anxiety d/o: Managed with Serax at nights--ativan substituted. Slept well.    LOS (Days) 5 A FACE TO FACE EVALUATION WAS PERFORMED  Jamarie Mussa Lorie Phenix, MD 12/26/2016 9:19 AM

## 2016-12-27 ENCOUNTER — Inpatient Hospital Stay (HOSPITAL_COMMUNITY): Payer: Medicare Other | Admitting: Physical Therapy

## 2016-12-27 ENCOUNTER — Inpatient Hospital Stay (HOSPITAL_COMMUNITY): Payer: Medicare Other | Admitting: Occupational Therapy

## 2016-12-27 ENCOUNTER — Inpatient Hospital Stay (HOSPITAL_COMMUNITY): Payer: Medicare Other

## 2016-12-27 LAB — BASIC METABOLIC PANEL
Anion gap: 6 (ref 5–15)
BUN: 17 mg/dL (ref 6–20)
CHLORIDE: 102 mmol/L (ref 101–111)
CO2: 27 mmol/L (ref 22–32)
CREATININE: 0.64 mg/dL (ref 0.44–1.00)
Calcium: 8.3 mg/dL — ABNORMAL LOW (ref 8.9–10.3)
GFR calc Af Amer: >60 mL/min (ref >60)
GFR calc non Af Amer: >60 mL/min (ref >60)
GLUCOSE: 122 mg/dL — AB (ref 65–99)
Potassium: 4.4 mmol/L (ref 3.5–5.1)
SODIUM: 135 mmol/L (ref 135–145)

## 2016-12-27 MED ORDER — ACYCLOVIR 5 % EX CREA
TOPICAL_CREAM | CUTANEOUS | Status: DC
  Filled 2016-12-27: qty 5

## 2016-12-27 MED ORDER — ACYCLOVIR 5 % EX OINT
TOPICAL_OINTMENT | CUTANEOUS | Status: DC
  Administered 2016-12-27 (×2): via TOPICAL
  Administered 2016-12-28: 1 via TOPICAL
  Administered 2016-12-28 – 2016-12-30 (×14): via TOPICAL
  Administered 2016-12-30: 1 via TOPICAL
  Administered 2016-12-31 (×3): via TOPICAL
  Administered 2016-12-31: 1 via TOPICAL
  Administered 2016-12-31 – 2017-01-04 (×21): via TOPICAL
  Filled 2016-12-27 (×2): qty 15

## 2016-12-27 NOTE — Progress Notes (Signed)
Occupational Therapy Session Note  Patient Details  Name: Kim Mcgee MRN: [id number] Date of Birth: 03/16/1941  Today's Date: 12/27/2016 OT Individual Time: 1354-1441 OT Individual Time Calculation (min): 47 min    Short Term Goals: Week 1:  OT Short Term Goal 1 (Week 1): Pt will be able to don LB clothing with AE with set up. OT Short Term Goal 2 (Week 1): Pt will be able to cleanse self post toileting with Supervision. OT Short Term Goal 3 (Week 1): Pt will be able to ambulate in and out of the bathroom with RW with min A. OT Short Term Goal 4 (Week 1): Pt will be able to tolerate standing for 10 min to enable her to make a snack in the kitchen.   Skilled Therapeutic Interventions/Progress Updates:    Pt received sitting up in w/c, ready for OT tx session. Transported Pt totalA via w/c to therapy gym where Pt engaged in dynamic standing activity of wii games. Pt alternating between sitting and standing during activity completion (wii bowling), engaging in 3 rounds of standing. Pt tolerates taking 3, 4, and 3 turns of bowling prior to needing seated rest break each round. Pt completes sit<>stand at [facility] with minA during each round, close minGuard for safety in standing during activity completion. Returned to room in manner described above at end of session where Pt was left seated in w/c, call bell and needs within reach, grandson present in room to visit.   Therapy Documentation Precautions:  Precautions Precautions: Back, Fall Precaution Comments: Reviewed back precations Restrictions Weight Bearing Restrictions: No  Pain: Pain Assessment Pain Assessment: Faces Pain Score: 5  Faces Pain Scale: Hurts a little bit Pain Type: Acute pain Pain Location: Back Pain Orientation: Mid;Lower Pain Descriptors / Indicators: Aching Pain Frequency: Intermittent Pain Onset: With Activity Pain Intervention(s): Repositioned;Rest ADL: ADL ADL Comments: refer to functional navigator  See  Function Navigator for Current Functional Status.   Therapy/Group: Individual Therapy  [name] 12/27/2016, 5:19 PM

## 2016-12-27 NOTE — Progress Notes (Signed)
[facility] MEDICINE & RE[facility]ABILITATION     PROGRESS NOTE    Subjective/Complaints: Up in bed. No new issues. Feels well. Pain generally controlled. Pleased with progress. Asked if her ortho was going to see her while she was here  ROS: pt denies nausea, vomiting, diarrhea, cough, shortness of breath or chest pain        Objective: Vital Signs: Blood pressure (!) 154/64, pulse 60, temperature 98.1 F (36.7 C), temperature source Oral, resp. rate 16, height 5\' 2"  (1.575 m), weight 77.3 kg (170 lb 8 oz), SpO2 96 %. No results found. No results for input(s): WBC, HGB, HCT, PLT in the last 72 hours.  Recent Labs  12/27/16 0613  NA 135  K 4.4  CL 102  GLUCOSE 122*  BUN 17  CREATININE 0.64  CALCIUM 8.3*   CBG (last 3)  No results for input(s): GLUCAP in the last 72 hours.  Wt Readings from Last 3 Encounters:  12/24/16 77.3 kg (170 lb 8 oz)  12/15/16 74.4 kg (164 lb)  12/04/16 74.4 kg (164 lb)    Physical Exam:  Gen: NAD. Vital signs reviewed.  HENT: Normocephalicand atraumatic.  Eyes: EOMI. No discharge.  Cardiovascular: RRR without murmur. No JVD   Respiratory: CTA Bilaterally without wheezes or rales. Normal effort    GI: Soft. Bowel sounds are normal.  Musculoskeletal: She exhibits no edemaor tenderness.  Neurological: She is alertand oriented.  Motor: B/l UE 5/5 proximal to distal.  LLE: 2-3/5 prox to 3/5 distal (improved). RLE: 3/5 prox to 4/5 distal.  Skin: Skin is warmand dry.  Psychiatric: She has a normal mood and affect. Her behavior is normal. Judgmentand thought contentnormal.    Assessment/Plan: 1. 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 Physiatrist is providing close team supervision and 24 hour management of active medical problems listed below. Physiatrist and rehab team continue to assess barriers to  discharge/monitor patient progress toward functional and medical goals.  Function:  Bathing Bathing position   Position: Shower  Bathing parts Body parts bathed by patient: Right arm, Left arm, Chest, Abdomen, Front perineal area, Right upper leg, Left upper leg, Right lower leg, Left lower leg Body parts bathed by helper: Buttocks, Back  Bathing assist Assist Level: Touching or steadying assistance(Pt > 75%)      Upper Body Dressing/Undressing Upper body dressing   What is the patient wearing?: Pull over shirt/dress     Pull over shirt/dress - Perfomed by patient: Thread/unthread right sleeve, Thread/unthread left sleeve, Put head through opening, Pull shirt over trunk          Upper body assist Assist Level: More than reasonable time      Lower Body Dressing/Undressing Lower body dressing   What is the patient wearing?: Underwear, Pants, Socks, Shoes Underwear - Performed by patient: Thread/unthread right underwear leg, Thread/unthread left underwear leg, Pull underwear up/down Underwear - Performed by helper: Thread/unthread right underwear leg, Thread/unthread left underwear leg Pants- Performed by patient: Thread/unthread right pants leg, Thread/unthread left pants leg, Pull pants up/down, Fasten/unfasten pants Pants- Performed by helper: Pull pants up/down Non-skid slipper socks- Performed by patient: Don/doff right sock, Don/doff left sock (50%) Non-skid slipper socks- Performed by helper: Don/doff right sock, Don/doff left sock Socks - Performed by patient: Don/doff right sock, Don/doff left sock     Shoes - Performed by helper: Don/doff right shoe, Don/doff left shoe  Lower body assist Assist for lower body dressing: Touching or steadying assistance (Pt > 75%)      Toileting Toileting   Toileting steps completed by patient: Adjust clothing prior to toileting, Performs perineal hygiene, Adjust clothing after toileting Toileting steps completed by  helper: Adjust clothing prior to toileting, Adjust clothing after toileting, Performs perineal hygiene Toileting Assistive Devices: Grab bar or rail  Toileting assist Assist level: Touching or steadying assistance (Pt.75%)   Transfers Chair/bed transfer   Chair/bed transfer method: Stand pivot Chair/bed transfer assist level: Touching or steadying assistance (Pt > 75%) Chair/bed transfer assistive device: Armrests, Medical sales representative     Max distance: 40 ft  Assist level: Supervision or verbal cues   Wheelchair   Type: Manual Max wheelchair distance: 50 Assist Level: Supervision or verbal cues  Cognition Comprehension Comprehension assist level: Follows complex conversation/direction with extra time/assistive device  Expression Expression assist level: Expresses complex 90% of the time/cues < 10% of the time  Social Interaction Social Interaction assist level: Interacts appropriately with others - No medications needed.  Problem Solving Problem solving assist level: Solves complex problems: With extra time  Memory Memory assist level: More than reasonable amount of time   Medical Problem List and Plan: 1. Paraplegia and functional deficitssecondary to T12 myelopathy  Cont CIR therapies  -making progress from a motor standpoint. Likely can avoid afo 2. DVT Prophylaxis/Anticoagulation: Mechanical: Sequential compression devices, below knee Bilateral lower extremities 3. Pain Management: Improving. Continue prn hydrocodone/flexeril.  4. Mood: team to Provide ego support. LCSW to follow for evaluation and support.  5. Neuropsych: This patient iscapable of making decisions on herown behalf. 6. Skin/Wound Care: Routine pressure relief measures. Monitor wound daily.  7. Fluids/Electrolytes/Nutrition:encourage fluids  -Good intake.   8. HTN: Monitor BP bid. Continue Toprol.   Improved this am, Lisinopril 5 started 7/29    9. H/o urinary incontinence/retention:  has resolved. Continue Flomax (home med)  PVR unremarkable. 10. Chronic dysphagia/GERD: On Protonix. 11. Leucocytosis: Resolved  UA neg. ucs neg 12. Prediabetes:   Cont to monitor 13. H/o Asthma with SOB: On dulera with albuterol prn. Encourage IS every hour.   -cxr unremarkable.   -encouraged IS/OOB 14. Anxiety d/o: Managed with Serax at nights--ativan substituted. Slept well.    LOS (Days) 6 A FACE TO FACE EVALUATION WAS PERFORMED  Meredith Staggers, MD 12/27/2016 9:16 AM

## 2016-12-27 NOTE — Progress Notes (Signed)
Physical Therapy Session Note  Patient Details  Name: Kim Mcgee MRN: [id number] Date of Birth: 05/04/1941  Today's Date: 12/27/2016 PT Individual Time: 0820-0903 and 7353-2992 PT Individual Time Calculation (min): 43 min and 36 min  Short Term Goals: Week 1:  PT Short Term Goal 1 (Week 1): Pt will perform bed mobility minA PT Short Term Goal 2 (Week 1): Pt will perform transfers with S PT Short Term Goal 3 (Week 1): Pt will ambulate 100' with minA and LRAD PT Short Term Goal 4 (Week 1): Pt will initiate stair training   Skilled Therapeutic Interventions/Progress Updates:  Treatment 1: Pt received in bed eating breakfast & requesting time to finish. Returned shortly & pt agreeable to tx. Pt reporting she just received pain medication. Pt transferred supine>sitting EOB with bed rails, HOB elevated, & min assist. Pt threaded pants on BLE & donned shoes with assistance and use of shoe aide & reacher. Pt donned shirt with set up assist. Pt ambulated within room & bathroom with RW & steady assist, completed toilet transfer with continent void, and performed hand hygiene standing at sink with close supervision for balance. Pt ambulated ~40 ft with RW & close supervision before requiring rest break. In gym pt negotiated 4 steps (3") + 5 steps with min assist; pt requires verbal cuing for compensatory pattern, tactile cuing for increased hip/knee flexion, and min assist for advancement of LLE 2/2 BLE foot drop. When descending stairs backwards pt slides foot onto lower step as she is unable to demonstrate enough hip/knee flexion & ankle dorsiflexion to clear step. Pt also requires cuing for hand placement on rails to increase upright posture. During stair negotiation pt reports intermittent pain RLE with exaggerated hip/knee flexion & LLE tingling. At end of session pt left sitting in w/c in room with all needs within reach.  Treatment 2: Pt received in room & agreeable to tx. Pt reports 5/10 pain but  reports being premedicated. Transported pt to gym via w/c total assist for time management & energy conservation. Pt engaged  in standing foot taps on various colored spots on floor and kicking soccer ball with BUE support with task focusing on BLE coordination and NMR. Pt continues to demonstrate decreased ability to flex B hip and knees, however, this improves during gait after standing activities. Pt ambulated gym>room with RW & close supervision initially with improved step length, hip/knee flexion, and dorsiflexion but this decreased as pt fatigued despite multimodal cuing. Pt also requires cuing to relax BUE arms/shoulders. At end of session pt left sitting in w/c in room with all needs within reach.   Therapy Documentation Precautions:  Precautions Precautions: Back, Fall Precaution Comments: Reviewed back precations Restrictions Weight Bearing Restrictions: No  General: PT Amount of Missed Time (min): 17 Minutes PT Missed Treatment Reason:  (breakfast)    See Function Navigator for Current Functional Status.   Therapy/Group: Individual Therapy  Waunita Schooner 12/27/2016, 3:49 PM

## 2016-12-27 NOTE — Progress Notes (Signed)
Patient ID: Kim Mcgee, female   DOB: 06-09-1940, 76 y.o.   MRN: [id number] I reviewed the events that occurred [place] since the time of my injection with Dr. Saintclair Halsted. I appreciate his care in my absence. Appears that Ms. Crunkleton is improving with some return of function left lower extremity. She is starting to walk and she notes that she feels substantially better. Continue to follow her along clinically.

## 2016-12-27 NOTE — Progress Notes (Signed)
Occupational Therapy Session Note  Patient Details  Name: Kim Mcgee MRN: [id number] Date of Birth: 01/02/1941  Today's Date: 12/27/2016 OT Individual Time: 1000-1100 OT Individual Time Calculation (min): 60 min    Short Term Goals: Week 1:  OT Short Term Goal 1 (Week 1): Pt will be able to don LB clothing with AE with set up. OT Short Term Goal 2 (Week 1): Pt will be able to cleanse self post toileting with Supervision. OT Short Term Goal 3 (Week 1): Pt will be able to ambulate in and out of the bathroom with RW with min A. OT Short Term Goal 4 (Week 1): Pt will be able to tolerate standing for 10 min to enable her to make a snack in the kitchen.   Skilled Therapeutic Interventions/Progress Updates:    Pt engaged in BADL retraining including bathing/dressing and toileting.  Pt amb with RW to bathroom and transferred to toilet.  Pt doffed clothing with sit<>stand from toilet prior to transferring to shower seat.  Pt completed bathing tasks with steady A and assist with bathing buttocks.  Pt amb with RW back wot w/c in room and completed dressing tasks using AE appropriately.  Pt required assistance with use of shoe funnel.  Focus on activity tolerance, sit<>stand, standing balance, functional amb with RW, AE use, and safety awareness to increase independence with BADLs.   Therapy Documentation Precautions:  Precautions Precautions: Back, Fall Precaution Comments: Reviewed back precations Restrictions Weight Bearing Restrictions: No Pain: Pain Assessment Pain Assessment: 0-10 Pain Score: 8  Pain Type: Acute pain Pain Location: Head Pain Orientation: Right Pain Descriptors / Indicators: Aching;Headache Pain Frequency: Rarely Pain Onset: Sudden (PA notified, vitals w/in Normal) Pain Intervention(s): RN aware and med admin  ADL: ADL ADL Comments: refer to functional navigator  See Function Navigator for Current Functional Status.   Therapy/Group: Individual  Therapy  Leroy Libman 12/27/2016, 11:07 AM

## 2016-12-28 ENCOUNTER — Inpatient Hospital Stay (HOSPITAL_COMMUNITY): Payer: Medicare Other

## 2016-12-28 ENCOUNTER — Inpatient Hospital Stay (HOSPITAL_COMMUNITY): Payer: Medicare Other | Admitting: Physical Therapy

## 2016-12-28 ENCOUNTER — Inpatient Hospital Stay (HOSPITAL_COMMUNITY): Payer: Medicare Other | Admitting: Occupational Therapy

## 2016-12-28 MED ORDER — BETHANECHOL CHLORIDE 25 MG PO TABS
25.0000 mg | ORAL_TABLET | Freq: Three times a day (TID) | ORAL | Status: DC
  Administered 2016-12-28 – 2017-01-04 (×22): 25 mg via ORAL
  Filled 2016-12-28 (×22): qty 1

## 2016-12-28 NOTE — Progress Notes (Signed)
Physical Therapy Session Note  Patient Details  Name: Kim Mcgee MRN: [id number] Date of Birth: November 14, 1940  Today's Date: 12/28/2016 PT Individual Time: 1530-1600 PT Individual Time Calculation (min): 30 min   Short Term Goals: Week 1:  PT Short Term Goal 1 (Week 1): Pt will perform bed mobility minA PT Short Term Goal 2 (Week 1): Pt will perform transfers with S PT Short Term Goal 3 (Week 1): Pt will ambulate 100' with minA and LRAD PT Short Term Goal 4 (Week 1): Pt will initiate stair training  Skilled Therapeutic Interventions/Progress Updates:   Pt in w/c upon arrival and agreeable to therapy, no c/o pain.   Pt ambulated 140' x2 to increase functional endurance and independence w/ mobility, w/ seated rest break secondary to fatigue, touching/steady assist.. Pt ambulates w/ slow gait speed. Verbal cues to increase BOS and step length.   Ended session in w/c in care of husband, all needs met.   Therapy Documentation Precautions:  Precautions Precautions: Back, Fall Precaution Comments: Reviewed back precations Restrictions Weight Bearing Restrictions: No General: PT Amount of Missed Time (min): 21 Minutes PT Missed Treatment Reason:  (pt requesting to eat lunch) Vital Signs: Therapy Vitals Temp: 97.7 F (36.5 C) Temp Source: Oral Pulse Rate: 84 Resp: 17 BP: 111/77 Patient Position (if appropriate): Sitting Oxygen Therapy SpO2: 99 % O2 Device: Not Delivered Pain: Pain Assessment Pain Score: 7  Faces Pain Scale: Hurts a little bit Pain Type: Acute pain Pain Location: Back Pain Descriptors / Indicators: Aching Pain Intervention(s): Medication (See eMAR)  See Function Navigator for Current Functional Status.   Therapy/Group: Individual Therapy  Cory Rama K Arnette 12/28/2016, 5:13 PM

## 2016-12-28 NOTE — Progress Notes (Signed)
Occupational Therapy Session Note  Patient Details  Name: Kim Mcgee MRN: [id number] Date of Birth: 08/14/1940  Today's Date: 12/28/2016 OT Individual Time: 1000-1100 OT Individual Time Calculation (min): 60 min    Short Term Goals: Week 1:  OT Short Term Goal 1 (Week 1): Pt will be able to don LB clothing with AE with set up. OT Short Term Goal 2 (Week 1): Pt will be able to cleanse self post toileting with Supervision. OT Short Term Goal 3 (Week 1): Pt will be able to ambulate in and out of the bathroom with RW with min A. OT Short Term Goal 4 (Week 1): Pt will be able to tolerate standing for 10 min to enable her to make a snack in the kitchen.   Skilled Therapeutic Interventions/Progress Updates:    Pt engaged in ongoing BADL retraining including bathing at shower level and dressing with sit<>stand from w/c.  Pt requires steady A for standing balance, during functional transfers, and functional amb with RW.  Pt required assistance with bathing buttocks.  Pt uses all AE (reacher, sponge, sock aide, and shoe funnel) appropriately to increase independence with BADLs.  Pt continues to fatigue quickly and requires multiple rest breaks during session.  Pt remained in w/c with all needs within reach.   Therapy Documentation Precautions:  Precautions Precautions: Back, Fall Precaution Comments: Reviewed back precations Restrictions Weight Bearing Restrictions: No  Pain: Pain Assessment Pain Score: 2 -back RN aware  See Function Navigator for Current Functional Status.   Therapy/Group: Individual Therapy  Leroy Libman 12/28/2016, 11:47 AM

## 2016-12-28 NOTE — Progress Notes (Addendum)
[place] PHYSICAL MEDICINE & REHABILITATION     PROGRESS NOTE    Subjective/Complaints: Continues to progress. Pain controlled. Had some difficulties with urine retention overnight and this morning.   ROS: pt denies nausea, vomiting, diarrhea, cough, shortness of breath or chest pain   Objective: Vital Signs: Blood pressure (!) 161/66, pulse 61, temperature 97.8 F (36.6 C), temperature source Oral, resp. rate 17, height 5\' 2"  (1.575 m), weight 77.3 kg (170 lb 8 oz), SpO2 99 %. No results found. No results for input(s): WBC, HGB, HCT, PLT in the last 72 hours.  Recent Labs  12/27/16 0613  NA 135  K 4.4  CL 102  GLUCOSE 122*  BUN 17  CREATININE 0.64  CALCIUM 8.3*   CBG (last 3)  No results for input(s): GLUCAP in the last 72 hours.  Wt Readings from Last 3 Encounters:  12/24/16 77.3 kg (170 lb 8 oz)  12/15/16 74.4 kg (164 lb)  12/04/16 74.4 kg (164 lb)    Physical Exam:  Gen: NAD. Vital signs reviewed.  HENT: Normocephalicand atraumatic.  Eyes: EOMI. No discharge.  Cardiovascular: RRR without murmur. No JVD   Respiratory: CTA Bilaterally without wheezes or rales. Normal effort     GI: Soft. Bowel sounds are normal.  Musculoskeletal: She exhibits no edemaor tenderness.  Neurological: She is alertand oriented.  Motor: B/l UE 5/5 proximal to distal.  LLE: 3-/5 prox to 3/5 distal RLE: 2+/5 prox to 4/5 distal.  Skin: Skin is warmand dry. backi incision clean and dry.  Psychiatric: She has a normal mood and affect. Her behavior is normal. Judgmentand thought contentnormal.    Assessment/Plan: 1. 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 Physiatrist is providing close team supervision and 24 hour management of active medical problems listed below. Physiatrist and rehab team continue to assess barriers to discharge/monitor patient  progress toward functional and medical goals.  Function:  Bathing Bathing position   Position: Shower  Bathing parts Body parts bathed by patient: Right arm, Left arm, Chest, Abdomen, Front perineal area, Right upper leg, Left upper leg, Right lower leg, Left lower leg Body parts bathed by helper: Buttocks, Back  Bathing assist Assist Level: Touching or steadying assistance(Pt > 75%)      Upper Body Dressing/Undressing Upper body dressing   What is the patient wearing?: Pull over shirt/dress     Pull over shirt/dress - Perfomed by patient: Thread/unthread right sleeve, Thread/unthread left sleeve, Put head through opening, Pull shirt over trunk          Upper body assist Assist Level: More than reasonable time      Lower Body Dressing/Undressing Lower body dressing   What is the patient wearing?: Underwear, Pants, Socks, Shoes Underwear - Performed by patient: Thread/unthread right underwear leg, Thread/unthread left underwear leg, Pull underwear up/down Underwear - Performed by helper: Thread/unthread right underwear leg, Thread/unthread left underwear leg Pants- Performed by patient: Thread/unthread right pants leg, Thread/unthread left pants leg, Pull pants up/down, Fasten/unfasten pants Pants- Performed by helper: Pull pants up/down Non-skid slipper socks- Performed by patient: Don/doff right sock, Don/doff left sock (50%) Non-skid slipper socks- Performed by helper: Don/doff right sock, Don/doff left sock Socks - Performed by patient: Don/doff right sock, Don/doff left sock   Shoes - Performed by patient: Don/doff right shoe, Don/doff left shoe Shoes - Performed by helper: Don/doff right shoe, Don/doff left shoe  Lower body assist Assist for lower body dressing: Touching or steadying assistance (Pt > 75%)      Toileting Toileting   Toileting steps completed by patient: Adjust clothing prior to toileting, Performs perineal hygiene, Adjust clothing after  toileting Toileting steps completed by helper: Adjust clothing prior to toileting, Adjust clothing after toileting, Performs perineal hygiene Toileting Assistive Devices: Grab bar or rail  Toileting assist Assist level: Touching or steadying assistance (Pt.75%)   Transfers Chair/bed transfer   Chair/bed transfer method: Stand pivot Chair/bed transfer assist level: Touching or steadying assistance (Pt > 75%) Chair/bed transfer assistive device: Armrests, Medical sales representative     Max distance: 120 ft  Assist level: Supervision or verbal cues   Wheelchair   Type: Manual Max wheelchair distance: 50 Assist Level: Supervision or verbal cues  Cognition Comprehension Comprehension assist level: Follows complex conversation/direction with extra time/assistive device  Expression Expression assist level: Expresses complex 90% of the time/cues < 10% of the time  Social Interaction Social Interaction assist level: Interacts appropriately with others - No medications needed.  Problem Solving Problem solving assist level: Solves complex problems: With extra time  Memory Memory assist level: More than reasonable amount of time   Medical Problem List and Plan: 1. Paraplegia and functional deficitssecondary to T12 myelopathy  Cont CIR therapies  -team conference today  -continue to provide ego support 2. DVT Prophylaxis/Anticoagulation: Mechanical: Sequential compression devices, below knee Bilateral lower extremities 3. Pain Management: Improving. Continue prn hydrocodone/flexeril.  4. Mood: team to Provide ego support. LCSW to follow for evaluation and support.  5. Neuropsych: This patient iscapable of making decisions on herown behalf. 6. Skin/Wound Care: Routine pressure relief measures. Monitor wound daily.  7. Fluids/Electrolytes/Nutrition:encourage fluids  -Good intake.   8. HTN: Monitor BP bid. Continue Toprol.   Improved overalll, Lisinopril 5 started 7/29   -dc  orthostatics 9. H/o urinary incontinence/retention: recurrence of retention last night  -Continue Flomax (home med)  -increase urecholine to 25mg   -ucx neg 10. Chronic dysphagia/GERD: On Protonix. 11. Leucocytosis: Resolved  UA neg. ucs neg 12. Prediabetes:   Cont to monitor 13. H/o Asthma with SOB: On dulera with albuterol prn. Encourage IS every hour.   -cxr unremarkable.    -encouraged IS/OOB 14. Anxiety d/o: Managed with Serax at nights--ativan substituted. Slept well.    LOS (Days) 7 A FACE TO FACE EVALUATION WAS PERFORMED  Meredith Staggers, MD 12/28/2016 9:53 AM

## 2016-12-28 NOTE — Progress Notes (Signed)
Occupational Therapy Session Note  Patient Details  Name: Kim Mcgee MRN: [id number] Date of Birth: 11/14/1940  Today's Date: 12/28/2016 OT Individual Time: 1418-1500 OT Individual Time Calculation (min): 42 min   Short Term Goals: Week 1:  OT Short Term Goal 1 (Week 1): Pt will be able to don LB clothing with AE with set up. OT Short Term Goal 2 (Week 1): Pt will be able to cleanse self post toileting with Supervision. OT Short Term Goal 3 (Week 1): Pt will be able to ambulate in and out of the bathroom with RW with min A. OT Short Term Goal 4 (Week 1): Pt will be able to tolerate standing for 10 min to enable her to make a snack in the kitchen.   Skilled Therapeutic Interventions/Progress Updates:    OT treatment session focused on functional ambulation, standing balance/endurance, and kitchen access. Pt educated on proper walker placement to access upper cabinets, drawers, microwave, and refrigerator. Pt tolerated 10 mins standing and searching for objects in cabinets. Discussed use of walker bag to transport kitchen items as well. Pt took seated rest break, then worked on functional ambulation w/ min guard A, RW and min cues for step through. Pt left seated in wc at end of session with family and RN present.   Therapy Documentation Precautions:  Precautions Precautions: Back, Fall Precaution Comments: Reviewed back precations Restrictions Weight Bearing Restrictions: No     See Function Navigator for Current Functional Status.   Therapy/Group: Individual Therapy  Valma Cava 12/28/2016, 3:01 PM

## 2016-12-28 NOTE — Evaluation (Signed)
Recreational Therapy Assessment and Plan  Patient Details  Name: Kim Mcgee MRN: [id number] Date of Birth: 1940/06/18 Today's Date: 12/28/2016  Rehab Potential: Good  ELOS: discharge 01/04/17  Assessment Problem List:      Patient Active Problem List   Diagnosis Date Noted  . 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 Back pain   . Confusion, postoperative   . Laryngospasm   . Neuropathic pain   . Steroid-induced hyperglycemia   . Post-op pain   . Leukocytosis   . Dyspnea 12/17/2016  . Hypothyroidism 12/17/2016  . Hypertension 12/17/2016  . Coronary artery disease 12/17/2016  . High cholesterol 12/17/2016  . Paraparesis ([place]) 12/16/2016  . S/P lumbar laminectomy 12/15/2016  . Lumbar spinal stenosis 07/30/2014  . Chest pain 02/11/2012  . Spinal stenosis 02/10/2012  . Hypoxemia 02/10/2012  . H/O laminectomy 02/10/2012  . GERD (gastroesophageal reflux disease) 02/10/2012  . Vocal cord dysfunction 02/10/2012  . Shortness of breath 02/09/2012    Past Medical History:      Past Medical History:  Diagnosis Date  . Anxiety   . Arthritis    lumbar DDD  . Asthma   . Basal cell carcinoma of nose   . Chronic lower back pain   . Coronary artery disease   . Dysrhythmia   . GERD (gastroesophageal reflux disease)   . High cholesterol   . Hypertension   . Hypothyroidism   . Normal cardiac stress test   . PONV (postoperative nausea and vomiting)   . Shortness of breath    /w walking long distance   . Swallowing difficulty    being seen by ENT- had endoscopy- told that its probably related to past neck surgeries    Past Surgical History:       Past Surgical History:  Procedure Laterality Date  . ANTERIOR CERVICAL DECOMP/DISCECTOMY FUSION  2010  . APPENDECTOMY    . BACK SURGERY    . BASAL CELL CARCINOMA EXCISION     "cut off nose"  . CARDIAC CATHETERIZATION  1970s; 1990s?; 2017  . CATARACT EXTRACTION W/ INTRAOCULAR LENS   IMPLANT, BILATERAL Bilateral   . CORONARY ANGIOPLASTY WITH STENT PLACEMENT  ~ 2017  . DECOMPRESSIVE LUMBAR LAMINECTOMY LEVEL 1 N/A 12/15/2016   Procedure: DECOMPRESSIVE  LAMINECTOMY Thoracic twelve to lumbar one;  Surgeon: Kary Kos, MD;  Location: [facility];  Service: Neurosurgery;  Laterality: N/A;  . DILATION AND CURETTAGE OF UTERUS    . OOPHORECTOMY     "?side"  . POSTERIOR FUSION LUMBAR SPINE  ~ 2013; 2016  . POSTERIOR LUMBAR FUSION 4 LEVEL N/A 12/15/2016   Procedure: Posterior Fusion Thoracic- ten to Lumbar- two;  Surgeon: Kary Kos, MD;  Location: [facility];  Service: Neurosurgery;  Laterality: N/A;  . TONSILLECTOMY    . TOTAL THYROIDECTOMY  1990's  . TUBAL LIGATION      Assessment & Plan Clinical Impression: A76 y.o.femalewith history of CAD, SOB, HTN, laryngospasm's, progressive back pain radiating to BLE and LLE weakness for 3-4 weeks with left foot drop who was [facility] with [facility] 12[facility]13[facility]. She was admitted on 7/18 with increase in pain and fall due to BLE giving away. 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 She was taken to OR on 7/19 for decompressive laminectomy T12-L1 with removal of large synovial cyst and decompression of T12 and L1 nerve roots with removal of loose L1 screws and arthrodesis as well as repair of  inadvertent dural tear by Dr. Saintclair Halsted. Post op on bedrest till 7/22. She has had issues with confusion and progressive dyspnea without hypoxia--IS recommended. Therapy evaluations done and CIR recommended for follow up therapy. Was independent without AD till about 4-5 weeks ago. Was getting week and using walker 2 weeks PTA. She was able to sit up for 60 minutes yesterday and has been getting up to [facility] with assistance.  Patient transferred to CIR on 12/21/2016 .   Pt presents with decreased activity tolerance, decreased functional mobility, decreased  balance, decreased coordination Limiting pt's independence with leisure/community pursuits.  Plan Min 1 TR session (community reintegration) >90 minutes during LOS  Recommendations for other services: None   Discharge Criteria: Patient will be discharged from TR if patient refuses treatment 3 consecutive times without medical reason.  If treatment goals not met, if there is a change in medical status, if patient makes no progress towards goals or if patient is discharged from hospital.  The above assessment, treatment plan, treatment alternatives and goals were discussed and mutually agreed upon: by patient  [place] 12/28/2016, 3:46 PM

## 2016-12-28 NOTE — Progress Notes (Signed)
Physical Therapy Session Note  Patient Details  Name: Kim Mcgee MRN: [id number] Date of Birth: 1941/01/13  Today's Date: 12/28/2016 PT Individual Time: 0832-0900 and 1322-1401 PT Individual Time Calculation (min): 28 min and 39 min  Short Term Goals: Week 1:  PT Short Term Goal 1 (Week 1): Pt will perform bed mobility minA PT Short Term Goal 2 (Week 1): Pt will perform transfers with S PT Short Term Goal 3 (Week 1): Pt will ambulate 100' with minA and LRAD PT Short Term Goal 4 (Week 1): Pt will initiate stair training  Skilled Therapeutic Interventions/Progress Updates:  Treatment 1: Pt received in room & agreeable to tx. Pt without c/o pain during session. Pt transfers supine>sitting EOB with HOB completely raised, bed rails, and supervision. Pt donned shirt with set up assistance and threaded pants on BLE with set up assist and reacher. Pt requires supervision for standing balance when pulling pants over hips and total assist for donning shoes for time management. Pt completes sit<>stand transfers from bed & toilet with either bed rails or grab bar and RW. Pt ambulates room<>bathroom with RW & close supervision, successfully negotiating bathroom threshold without cuing to clear it. Pt with continent void & BM on toilet; pt is able to complete frontal peri hygiene but requires total assist for posterior hygiene 2/2 back precautions. Pt performed hand hygiene with supervision and UE support while standing. Pt completes BLE standing exercises consisting of hip/knee flexion with demonstration and multimodal cuing for technique. Pt with ability to lift RLE higher than LLE. At end of session pt left sitting in w/c set up at sink in care of RN.  Treatment 2: Pt received in room eating lunch & requesting to finish. Therapist returned shortly and pt agreeable to tx and on toilet. Pt requires min assist and multiple trials to complete sit>stand from toilet with use of RW & grab bar. Pt requires total  assist for posterior peri hygiene following BM but pt is able complete anterior perihygiene. Pt completed hand hygiene standing at sink with steady assist/close supervision for balance. Transported pt to gym via w/c total assist and pt utilized cybex kinetron in sitting up to 80 cm/sec with task focusing on BLE strengthening and NMR. Pt required 1 rest break between 2 trials 2/2 fatigue. Pt engaged in game of connect four while standing without UE support for 1.5 minutes x 2 trials with min assist for balance and cuing to limit UE support. Pt able to complete sit<>stand transfers from w/c with supervision and use of armrests. Pt propelled w/c gym>room with BUE & supervision for cardiopulmonary endurance training and UE strengthening. At end of session pt left sitting in w/c in room with all needs within reach & husband present to supervise.    Therapy Documentation Precautions:  Precautions Precautions: Back, Fall Precaution Comments: Reviewed back precations Restrictions Weight Bearing Restrictions: No  General: PT Amount of Missed Time (min): 21 Minutes PT Missed Treatment Reason:  (pt requesting to eat lunch)   See Function Navigator for Current Functional Status.   Therapy/Group: Individual Therapy  Waunita Schooner 12/28/2016, 3:41 PM

## 2016-12-29 ENCOUNTER — Inpatient Hospital Stay (HOSPITAL_COMMUNITY): Payer: Medicare Other

## 2016-12-29 ENCOUNTER — Inpatient Hospital Stay (HOSPITAL_COMMUNITY): Payer: Medicare Other | Admitting: Physical Therapy

## 2016-12-29 ENCOUNTER — Inpatient Hospital Stay (HOSPITAL_COMMUNITY): Payer: Medicare Other | Admitting: *Deleted

## 2016-12-29 MED ORDER — MUSCLE RUB 10-15 % EX CREA
TOPICAL_CREAM | CUTANEOUS | Status: DC | PRN
  Administered 2016-12-29 – 2016-12-31 (×3): via TOPICAL
  Filled 2016-12-29: qty 85

## 2016-12-29 NOTE — Progress Notes (Signed)
Physical Therapy Session Note  Patient Details  Name: Kim Mcgee MRN: [id number] Date of Birth: January 07, 1941  Today's Date: 12/29/2016 PT Individual Time: 1430-1530 PT Individual Time Calculation (min): 60 min   Short Term Goals: Week 1:  PT Short Term Goal 1 (Week 1): Pt will perform bed mobility minA PT Short Term Goal 2 (Week 1): Pt will perform transfers with S PT Short Term Goal 3 (Week 1): Pt will ambulate 100' with minA and LRAD PT Short Term Goal 4 (Week 1): Pt will initiate stair training  Skilled Therapeutic Interventions/Progress Updates: Pt presented in bed agreeable to therapy however stating increased pain fatigue, possibly from outing earlier today. Performed supine to sit with min guard and use of features. Pt requesting to use toilet prior to therapy. Donned shoes today assist for time management. Sit to stand minA and ambulated to toilet min guard with RW. Toilet transfers minA pt able to perform frontal peri-hygiene without assist. After toileting ambulated to sink and performed hand hygiene in standing. Transported pt to rehab gym for energy conservation. Performed standing balance activities for endurance and standing tolerance. Performed alternating toe tapping with pt able to tap 6in step with LLE with increased time. Standing horse shoes on level surface and with one foot supported on 2in step. Pt required intermittent standing breaks due to fatigue. Performed NuStep for LE strengthening and endurance. L1 x 3 min then L2 x7 min. Pt returned to w/c and transported back to room due to fatigue. Pt request to remain in w/c with call bell within reach and needs met.      Therapy Documentation Precautions:  Precautions Precautions: Back, Fall Precaution Comments: Reviewed back precations Restrictions Weight Bearing Restrictions: No General:   Vital Signs:  Pain: Pain Assessment Pain Score: Asleep Pain Type: Acute pain Pain Location: Back Pain Orientation: Right;Left  (R>L) Pain Descriptors / Indicators: Aching Pain Intervention(s): Medication (See eMAR)   See Function Navigator for Current Functional Status.   Therapy/Group: Individual Therapy  Vance Belcourt  Payson Crumby, PTA  12/29/2016, 3:58 PM

## 2016-12-29 NOTE — Progress Notes (Signed)
Social Work Patient ID: Bary Richard, female   DOB: 1940-08-19, 76 y.o.   MRN: [id number]   Rexene Alberts Social Worker Signed   Patient Care Conference Date of Service: 12/29/2016 10:49 AM      [] Hide copied text [] Hover for attribution information Inpatient RehabilitationTeam Conference and Plan of Care Update Date: 12/28/2016   Time: 2:20 PM    Patient Name: Kim Mcgee      Medical Record Number: [id number]  Date of Birth: Apr 20, 1941 Sex: Female         Room/Bed: 4W18C/4W18C-01 Payor Info: Payor: MEDICARE / Plan: MEDICARE PART A AND B / Product Type: *No Product type* /    Admitting Diagnosis: Lumbar Myleopathy  Admit Date/Time:  12/21/2016  5:57 PM Admission Comments: No comment available   Primary Diagnosis:  <principal problem not specified> Principal Problem: <principal problem not specified>      Patient Active Problem List   Diagnosis Date Noted  . Benign essential HTN   . Labile blood pressure   . Prediabetes   . 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 Back pain   . Confusion, postoperative   . Laryngospasm   . Neuropathic pain   . Steroid-induced hyperglycemia   . Post-op pain   . Leukocytosis   . Dyspnea 12/17/2016  . Hypothyroidism 12/17/2016  . Hypertension 12/17/2016  . Coronary artery disease 12/17/2016  . High cholesterol 12/17/2016  . Paraparesis ([place]) 12/16/2016  . S/P lumbar laminectomy 12/15/2016  . Lumbar spinal stenosis 07/30/2014  . Chest pain 02/11/2012  . Spinal stenosis 02/10/2012  . Hypoxemia 02/10/2012  . H/O laminectomy 02/10/2012  . GERD (gastroesophageal reflux disease) 02/10/2012  . Vocal cord dysfunction 02/10/2012  . Shortness of breath 02/09/2012    Expected Discharge Date: Expected Discharge Date: 01/04/17  Team Members Present: Physician leading conference: Dr. Alger Simons Social Worker Present: Lennart Pall, LCSW Nurse Present: Junius Creamer, RN PT Present: Canary Brim, PT OT Present:  Roanna Epley, Ada, OT SLP Present: Weston Anna, SLP PPS Coordinator present : Daiva Nakayama, RN, CRRN     Current Status/Progress Goal Weekly Team Focus  Medical   T12 myelopathy s/p decompression/re-do, regaining strength. pain controlled. bowel and bladder function improving  improve use of left leg  bp, pain control, bladder and bowel output   Bowel/Bladder   Patient is continent of bowel & bladder, LBM 12/27/16  remain continent  assist as needed & continue to monitor   Swallow/Nutrition/ Hydration             ADL's   bathing/dressing-steady A; functional transfers-steady A; toileting-mod A  mod I/supervision overall  toileting, functional transfers, standing balance, BADL retraining, activity tolerance, family educaiton   Mobility   min assist for transfers, close supervision ambulation with RW, impaired coordination  supervision overall  NMR, coordination, endurance, transfers, balance, gait, stair negotiation   Communication             Safety/Cognition/ Behavioral Observations            Pain   C/o pain to her back 6/10. Has neurontin scheduled, prn tylenol & Norco. Takes norco 3-4 times daily  pain scale <4  continue to assess & treat as needed   Skin   Multiple bruises to BUE, reddedned discoloration to nasal/sinus area, incision is mid/ back, area is closed, telfa in place, no drainage, no redness  no new areas of skin break down  assess q shift    Rehab Goals Patient on  target to meet rehab goals: Yes *See Care Plan and progress notes for long and short-term goals.     Barriers to Discharge  Current Status/Progress Possible Resolutions Date Resolved   Physician    Medical stability        optimize bp control, pain mgt      Nursing                PT                    OT                 SLP            SW            Discharge Planning/Teaching Needs:  Plan to d/c home with spouse and daughter providing 24/7  assistance.  Teaching to be scheduled   Team Discussion:  Intermittent bladder issues and req'd cath last pm.  Wound healing well.  Steady assist with ADLs;  Using AE and does well recalling and following back precautions.  Min assist overall with PT.  Goals for mod independent to supervision.  Revisions to Treatment Plan:  None    Continued Need for Acute Rehabilitation Level of Care: The patient requires daily medical management by a physician with specialized training in physical medicine and rehabilitation for the following conditions: Daily direction of a multidisciplinary physical rehabilitation program to ensure safe treatment while eliciting the highest outcome that is of practical value to the patient.: Yes Daily medical management of patient stability for increased activity during participation in an intensive rehabilitation regime.: Yes Daily analysis of laboratory values and/or radiology reports with any subsequent need for medication adjustment of medical intervention for : Neurological problems;Post surgical problems  Jeny Nield 12/29/2016, 10:49 AM

## 2016-12-29 NOTE — Progress Notes (Signed)
Social Work Patient ID: Kim Mcgee, female   DOB: 1941/04/30, 76 y.o.   MRN: [id number]   Have reviewed team conference with pt and spouse.  Both very pleased with goals of mod ind - supervision and targeted d/c date of 8/7.  Feel they are seeing steady progress and no concerns at this time.  Continue to follow.  Caren Garske, LCSW

## 2016-12-29 NOTE — Progress Notes (Signed)
Occupational Therapy Weekly Progress Note  Patient Details  Name: Kim Mcgee MRN: [id number] Date of Birth: 02-Aug-1940  Beginning of progress report period: December 22, 2016 End of progress report period: December 29, 2016  Patient has met 3 of 4 short term goals.  Pt made steady progress with BADLs during the past week.  Pt amb with RW with steady A in room and into bathroom during functional tasks.  Pt requires steady A when standing during bathing/dressing tasks.  Pt is using AE (reacher, sponge, socks aid, and shoe funnel) appropriately during bathing/dressing tasks.  Pt continues to requires assistance with toilet hygiene after toileting.  Pt fatigues easily and requires multiple rest breaks during BADLs. Patient continues to demonstrate the following deficits: muscle weakness, decreased cardiorespiratoy endurance and decreased standing balance and therefore will continue to benefit from skilled OT intervention to enhance overall performance with BADL and iADL.  Patient progressing toward long term goals..  Continue plan of care.  OT Short Term Goals Week 1:  OT Short Term Goal 1 (Week 1): Pt will be able to don LB clothing with AE with set up. OT Short Term Goal 1 - Progress (Week 1): Met OT Short Term Goal 2 (Week 1): Pt will be able to cleanse self post toileting with Supervision. OT Short Term Goal 2 - Progress (Week 1): Progressing toward goal OT Short Term Goal 3 (Week 1): Pt will be able to ambulate in and out of the bathroom with RW with min A. OT Short Term Goal 3 - Progress (Week 1): Met OT Short Term Goal 4 (Week 1): Pt will be able to tolerate standing for 10 min to enable her to make a snack in the kitchen.  OT Short Term Goal 4 - Progress (Week 1): Met Week 2:  OT Short Term Goal 1 (Week 2): STG=LTG secondary to ELOS      Therapy Documentation Precautions:  Precautions Precautions: Back, Fall Precaution Comments: Reviewed back precations Restrictions Weight Bearing  Restrictions: No  See Function Navigator for Current Functional Status.    Leotis Shames [facility] 12/29/2016, 6:53 AM

## 2016-12-29 NOTE — Progress Notes (Signed)
Occupational Therapy Note  Patient Details  Name: Chareese Sergent MRN: [id number] Date of Birth: 1941/01/09  Today's Date: 12/29/2016 OT Concurrent Time: 1030-1230 OT Concurrent Time Calculation (min): 120 min  Pt denies pain Individual Therapy  Pt participated in community reintegration outing to [employer].  Focus on functional amb with RW in community setting, safety awareness, energy conservation, problem solving when presented with barriers in the community, and ongoing education.  Pt required mod A for entering/exiting van-3 steps.  Pt required supervision with ambulation.  See Outing Goal sheet in shadow chart. Pt returned to room and remained in w/c with all needs within reach.    Leotis Shames [facility] 12/29/2016, 1:27 PM

## 2016-12-29 NOTE — Progress Notes (Signed)
Occupational Therapy Session Note  Patient Details  Name: Keiana Tavella MRN: [id number] Date of Birth: 1941/04/29  Today's Date: 12/29/2016 OT Individual Time: 2641-5830 OT Individual Time Calculation (min): 45 min    Short Term Goals: Week 2:  OT Short Term Goal 1 (Week 2): STG=LTG secondary to ELOS  Skilled Therapeutic Interventions/Progress Updates:    Pt engaged in ongoing BADL retraining including bathing at shower level and dressing with sit<>stand from w/c.  Pt amb with RW into/out of bathroom to use toilet and engage in showering.  Pt required steady A going up/down bathroom entrance.  Pt requires assistance with cleansing buttocks in shower and after toileting.  Pt required min A for sit<>stand in shower X 1.  Pt performed sit<>stand in shower at supervision level X 3.  Pt uses AE appropriately for LB bathing/dressing tasks.    Therapy Documentation Precautions:  Precautions Precautions: Back, Fall Precaution Comments: Reviewed back precations Restrictions Weight Bearing Restrictions: No  Pain: Pain Assessment Pain Score: 6  Pain Type: Acute pain Pain Location: Back Pain Orientation: Right;Left (R>L) Pain Descriptors / Indicators: Aching Pain Intervention(s): repositioned and RN aware    See Function Navigator for Current Functional Status.   Therapy/Group: Individual Therapy  Leroy Libman 12/29/2016, 1:26 PM

## 2016-12-29 NOTE — Progress Notes (Signed)
[place] PHYSICAL MEDICINE & REHABILITATION     PROGRESS NOTE    Subjective/Complaints: Having some urine retention yesterday---seems to be improving today. Pain tolerable. Overall happy with progress  ROS: pt denies nausea, vomiting, diarrhea, cough, shortness of breath or chest pain   Objective: Vital Signs: Blood pressure (!) 141/50, pulse 69, temperature 97.9 F (36.6 C), temperature source Oral, resp. rate 17, height 5\' 2"  (1.575 m), weight 77.3 kg (170 lb 8 oz), SpO2 99 %. No results found. No results for input(s): WBC, HGB, HCT, PLT in the last 72 hours.  Recent Labs  12/27/16 0613  NA 135  K 4.4  CL 102  GLUCOSE 122*  BUN 17  CREATININE 0.64  CALCIUM 8.3*   CBG (last 3)  No results for input(s): GLUCAP in the last 72 hours.  Wt Readings from Last 3 Encounters:  12/24/16 77.3 kg (170 lb 8 oz)  12/15/16 74.4 kg (164 lb)  12/04/16 74.4 kg (164 lb)    Physical Exam:  Gen: NAD. Vital signs reviewed.  HENT: Normocephalicand atraumatic.  Eyes: EOMI. No discharge.  Cardiovascular: RRR without murmur. No JVD    Respiratory: CTA Bilaterally without wheezes or rales. Normal effort    GI: Soft. Bowel sounds are normal.  Musculoskeletal: She exhibits no edemaor tenderness.  Neurological: She is alertand oriented.  Motor: B/l UE 5/5 proximal to distal.  LLE: 3-/5 prox to 3/5 distal RLE: 2+/5 prox to 4/5 distal.  Skin: Skin is warmand dry. backi incision clean and dry.  Psychiatric: She has a normal mood and affect. Her behavior is normal. Judgmentand thought contentnormal.    Assessment/Plan: 1. 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 Physiatrist is providing close team supervision and 24 hour management of active medical problems listed below. Physiatrist and rehab team continue to assess barriers to discharge/monitor patient  progress toward functional and medical goals.  Function:  Bathing Bathing position   Position: Shower  Bathing parts Body parts bathed by patient: Right arm, Left arm, Chest, Abdomen, Front perineal area, Right upper leg, Left upper leg, Right lower leg, Left lower leg Body parts bathed by helper: Buttocks, Back  Bathing assist Assist Level: Touching or steadying assistance(Pt > 75%)      Upper Body Dressing/Undressing Upper body dressing   What is the patient wearing?: Pull over shirt/dress     Pull over shirt/dress - Perfomed by patient: Thread/unthread right sleeve, Thread/unthread left sleeve, Put head through opening, Pull shirt over trunk          Upper body assist Assist Level: More than reasonable time      Lower Body Dressing/Undressing Lower body dressing   What is the patient wearing?: Underwear, Pants, Socks, Shoes Underwear - Performed by patient: Thread/unthread right underwear leg, Thread/unthread left underwear leg, Pull underwear up/down Underwear - Performed by helper: Thread/unthread right underwear leg, Thread/unthread left underwear leg Pants- Performed by patient: Thread/unthread right pants leg, Thread/unthread left pants leg, Pull pants up/down, Fasten/unfasten pants Pants- Performed by helper: Pull pants up/down Non-skid slipper socks- Performed by patient: Don/doff right sock, Don/doff left sock (50%) Non-skid slipper socks- Performed by helper: Don/doff right sock, Don/doff left sock Socks - Performed by patient: Don/doff right sock, Don/doff left sock   Shoes - Performed by patient: Don/doff right shoe, Don/doff left shoe Shoes - Performed by helper: Don/doff right shoe, Don/doff left shoe  Lower body assist Assist for lower body dressing: Touching or steadying assistance (Pt > 75%)      Toileting Toileting   Toileting steps completed by patient: Adjust clothing prior to toileting, Performs perineal hygiene, Adjust clothing after  toileting Toileting steps completed by helper: Adjust clothing prior to toileting, Adjust clothing after toileting, Performs perineal hygiene Toileting Assistive Devices: Grab bar or rail  Toileting assist Assist level: Touching or steadying assistance (Pt.75%)   Transfers Chair/bed transfer   Chair/bed transfer method: Stand pivot Chair/bed transfer assist level: Touching or steadying assistance (Pt > 75%) Chair/bed transfer assistive device: Armrests, Medical sales representative     Max distance: 140' Assist level: Touching or steadying assistance (Pt > 75%)   Wheelchair   Type: Manual Max wheelchair distance: 120 ft  Assist Level: Supervision or verbal cues  Cognition Comprehension Comprehension assist level: Follows complex conversation/direction with extra time/assistive device  Expression Expression assist level: Expresses complex 90% of the time/cues < 10% of the time  Social Interaction Social Interaction assist level: Interacts appropriately with others - No medications needed.  Problem Solving Problem solving assist level: Solves complex problems: With extra time  Memory Memory assist level: More than reasonable amount of time   Medical Problem List and Plan: 1. Paraplegia and functional deficitssecondary to T12 myelopathy  Cont CIR therapies 2. DVT Prophylaxis/Anticoagulation: Mechanical: Sequential compression devices, below knee Bilateral lower extremities 3. Pain Management: Improving. Continue prn hydrocodone/flexeril.  4. Mood: team to Provide ego support. LCSW to follow for evaluation and support.  5. Neuropsych: This patient iscapable of making decisions on herown behalf. 6. Skin/Wound Care: Routine pressure relief measures. Monitor wound daily.  7. Fluids/Electrolytes/Nutrition:encourage fluids  -Good intake.   8. HTN: Monitor BP bid. Continue Toprol.   Improved overalll, Lisinopril 5 started 7/29   -dc orthostatics 9. H/o urinary  incontinence/retention: recurrence of retention last night  -Continue Flomax (home med)  -increased urecholine to 25mg ---monitor for pattern---showing some improvement  -ucx neg 10. Chronic dysphagia/GERD: On Protonix. 11. Leucocytosis: Resolved  UA neg. ucs neg 12. Prediabetes:   Cont to monitor 13. H/o Asthma with SOB: On dulera with albuterol prn. Encourage IS every hour.   -cxr unremarkable.    -encouraged IS/OOB 14. Anxiety d/o: Managed with Serax at nights--ativan substituted. Slept well.    LOS (Days) 8 A FACE TO FACE EVALUATION WAS PERFORMED  Meredith Staggers, MD 12/29/2016 1:26 PM

## 2016-12-29 NOTE — Patient Care Conference (Signed)
Inpatient RehabilitationTeam Conference and Plan of Care Update Date: 12/28/2016   Time: 2:20 PM    Patient Name: Kim Mcgee      Medical Record Number: [id number]  Date of Birth: 1940/09/01 Sex: Female         Room/Bed: 4W18C/4W18C-01 Payor Info: Payor: MEDICARE / Plan: MEDICARE PART A AND B / Product Type: *No Product type* /    Admitting Diagnosis: Lumbar Myleopathy  Admit Date/Time:  12/21/2016  5:57 PM Admission Comments: No comment available   Primary Diagnosis:  <principal problem not specified> Principal Problem: <principal problem not specified>  Patient Active Problem List   Diagnosis Date Noted  . Benign essential HTN   . Labile blood pressure   . Prediabetes   . 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 Back pain   . Confusion, postoperative   . Laryngospasm   . Neuropathic pain   . Steroid-induced hyperglycemia   . Post-op pain   . Leukocytosis   . Dyspnea 12/17/2016  . Hypothyroidism 12/17/2016  . Hypertension 12/17/2016  . Coronary artery disease 12/17/2016  . High cholesterol 12/17/2016  . Paraparesis ([place]) 12/16/2016  . S/P lumbar laminectomy 12/15/2016  . Lumbar spinal stenosis 07/30/2014  . Chest pain 02/11/2012  . Spinal stenosis 02/10/2012  . Hypoxemia 02/10/2012  . H/O laminectomy 02/10/2012  . GERD (gastroesophageal reflux disease) 02/10/2012  . Vocal cord dysfunction 02/10/2012  . Shortness of breath 02/09/2012    Expected Discharge Date: Expected Discharge Date: 01/04/17  Team Members Present: Physician leading conference: Dr. Alger Simons Social Worker Present: Lennart Pall, LCSW Nurse Present: Junius Creamer, RN PT Present: Canary Brim, PT OT Present: Roanna Epley, [place], OT SLP Present: Weston Anna, SLP PPS Coordinator present : Daiva Nakayama, RN, CRRN     Current Status/Progress Goal Weekly Team Focus  Medical   T12 myelopathy s/p decompression/re-do, regaining strength. pain controlled. bowel and bladder  function improving  improve use of left leg  bp, pain control, bladder and bowel output   Bowel/Bladder   Patient is continent of bowel & bladder, LBM 12/27/16  remain continent  assist as needed & continue to monitor   Swallow/Nutrition/ Hydration             ADL's   bathing/dressing-steady A; functional transfers-steady A; toileting-mod A  mod I/supervision overall  toileting, functional transfers, standing balance, BADL retraining, activity tolerance, family educaiton   Mobility   min assist for transfers, close supervision ambulation with RW, impaired coordination  supervision overall  NMR, coordination, endurance, transfers, balance, gait, stair negotiation   Communication             Safety/Cognition/ Behavioral Observations            Pain   C/o pain to her back 6/10. Has neurontin scheduled, prn tylenol & Norco. Takes norco 3-4 times daily  pain scale <4  continue to assess & treat as needed   Skin   Multiple bruises to BUE, reddedned discoloration to nasal/sinus area, incision is mid/ back, area is closed, telfa in place, no drainage, no redness  no new areas of skin break down  assess q shift    Rehab Goals Patient on target to meet rehab goals: Yes *See Care Plan and progress notes for long and short-term goals.     Barriers to Discharge  Current Status/Progress Possible Resolutions Date Resolved   Physician    Medical stability        optimize bp control, pain mgt  Nursing                  PT                    OT                  SLP                SW                Discharge Planning/Teaching Needs:  Plan to d/c home with spouse and daughter providing 24/7 assistance.  Teaching to be scheduled   Team Discussion:  Intermittent bladder issues and req'd cath last pm.  Wound healing well.  Steady assist with ADLs;  Using AE and does well recalling and following back precautions.  Min assist overall with PT.  Goals for mod independent to supervision.   Revisions to Treatment Plan:  None    Continued Need for Acute Rehabilitation Level of Care: The patient requires daily medical management by a physician with specialized training in physical medicine and rehabilitation for the following conditions: Daily direction of a multidisciplinary physical rehabilitation program to ensure safe treatment while eliciting the highest outcome that is of practical value to the patient.: Yes Daily medical management of patient stability for increased activity during participation in an intensive rehabilitation regime.: Yes Daily analysis of laboratory values and/or radiology reports with any subsequent need for medication adjustment of medical intervention for : Neurological problems;Post surgical problems  Kim Mcgee 12/29/2016, 10:49 AM

## 2016-12-29 NOTE — Progress Notes (Signed)
Recreational Therapy Discharge Summary Patient Details  Name: Kim Mcgee MRN: [id number] Date of Birth: 1940/08/04 Today's Date: 12/29/2016  Date:  12/29/16 Pain:  No c/o Session Note:  Pt participated in community reintegration/outing to [place] at overall Supervision ambulatory level using RW.  Goals focused on safe community mobility, identification & negotiation of obstacles, accessing public restroom, energy conservation techniques/education.  See outing goal sheet in shadow chart for full details.  Discharge Summary: Comments on progress toward goals: Pt met community reintegration goals with exception of Lucianne Lei step negotiation in which pt required mod assist-see outing goals sheet for goal & obtainment.  Pt is extremely motivated and anxious to return home.  Pt is set for discharge home on 8/7.  Reasons for discharge: treatment goals met  Patient/family agrees with progress made and goals achieved: Yes  Saylee Sherrill 12/29/2016, 2:00 PM

## 2016-12-30 ENCOUNTER — Inpatient Hospital Stay (HOSPITAL_COMMUNITY): Payer: Medicare Other

## 2016-12-30 ENCOUNTER — Inpatient Hospital Stay (HOSPITAL_COMMUNITY): Payer: Medicare Other | Admitting: Occupational Therapy

## 2016-12-30 ENCOUNTER — Inpatient Hospital Stay (HOSPITAL_COMMUNITY): Payer: Medicare Other | Admitting: Physical Therapy

## 2016-12-30 DIAGNOSIS — N319 Neuromuscular dysfunction of bladder, unspecified: Secondary | ICD-10-CM

## 2016-12-30 MED ORDER — TAMSULOSIN HCL 0.4 MG PO CAPS
0.4000 mg | ORAL_CAPSULE | Freq: Two times a day (BID) | ORAL | Status: DC
  Administered 2016-12-30 – 2017-01-04 (×10): 0.4 mg via ORAL
  Filled 2016-12-30 (×10): qty 1

## 2016-12-30 NOTE — Progress Notes (Signed)
[place] PHYSICAL MEDICINE & REHABILITATION     PROGRESS NOTE    Subjective/Complaints: Urine retention still at night. Had success previously with flomax at home---using once daily here. Emptying during the day  ROS: pt denies nausea, vomiting, diarrhea, cough, shortness of breath or chest pain   Objective: Vital Signs: Blood pressure (!) 165/69, pulse 61, temperature 98 F (36.7 C), temperature source Oral, resp. rate 18, height 5\' 2"  (1.575 m), weight 77.3 kg (170 lb 8 oz), SpO2 100 %. No results found. No results for input(s): WBC, HGB, HCT, PLT in the last 72 hours. No results for input(s): NA, K, CL, GLUCOSE, BUN, CREATININE, CALCIUM in the last 72 hours.  Invalid input(s): CO CBG (last 3)  No results for input(s): GLUCAP in the last 72 hours.  Wt Readings from Last 3 Encounters:  12/24/16 77.3 kg (170 lb 8 oz)  12/15/16 74.4 kg (164 lb)  12/04/16 74.4 kg (164 lb)    Physical Exam:  Gen: NAD. Vital signs reviewed.  HENT: Normocephalicand atraumatic.  Eyes: EOMI. No discharge.  Cardiovascular: RRR without murmur. No JVD     Respiratory: CTA Bilaterally without wheezes or rales. Normal effort     GI: Soft. Bowel sounds are normal.  Musculoskeletal: She exhibits no edemaor tenderness.  Neurological: She is alertand oriented.  Motor: B/l UE 5/5 proximal to distal.  LLE: 3-/5 prox to 3/5 distal--stable RLE: 2+/5 prox to 4/5 distal--stable  Skin: Skin is warmand dry. backi incision clean and dry.  Psychiatric: She has a normal mood and affect. Her behavior is normal. Judgmentand thought contentnormal.    Assessment/Plan: 1. 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 Physiatrist is providing close team supervision and 24 hour management of active medical problems listed below. Physiatrist and rehab team continue to assess barriers to  discharge/monitor patient progress toward functional and medical goals.  Function:  Bathing Bathing position   Position: Shower  Bathing parts Body parts bathed by patient: Right arm, Left arm, Chest, Abdomen, Front perineal area, Right upper leg, Left upper leg, Right lower leg, Left lower leg Body parts bathed by helper: Buttocks, Back  Bathing assist Assist Level: Touching or steadying assistance(Pt > 75%)      Upper Body Dressing/Undressing Upper body dressing   What is the patient wearing?: Pull over shirt/dress     Pull over shirt/dress - Perfomed by patient: Thread/unthread right sleeve, Thread/unthread left sleeve, Put head through opening, Pull shirt over trunk          Upper body assist Assist Level: More than reasonable time      Lower Body Dressing/Undressing Lower body dressing   What is the patient wearing?: Underwear, Pants, Socks, Shoes Underwear - Performed by patient: Thread/unthread right underwear leg, Thread/unthread left underwear leg, Pull underwear up/down Underwear - Performed by helper: Thread/unthread right underwear leg, Thread/unthread left underwear leg Pants- Performed by patient: Thread/unthread right pants leg, Thread/unthread left pants leg, Pull pants up/down, Fasten/unfasten pants Pants- Performed by helper: Pull pants up/down Non-skid slipper socks- Performed by patient: Don/doff right sock, Don/doff left sock (50%) Non-skid slipper socks- Performed by helper: Don/doff right sock, Don/doff left sock Socks - Performed by patient: Don/doff right sock, Don/doff left sock   Shoes - Performed by patient: Don/doff right shoe, Don/doff left shoe Shoes - Performed by helper: Don/doff right shoe, Don/doff left shoe  Lower body assist Assist for lower body dressing: Touching or steadying assistance (Pt > 75%)      Toileting Toileting   Toileting steps completed by patient: (P) Performs perineal hygiene, Adjust clothing prior to  toileting, Adjust clothing after toileting Toileting steps completed by helper: Adjust clothing prior to toileting, Adjust clothing after toileting, Performs perineal hygiene Toileting Assistive Devices: (P) Grab bar or rail  Toileting assist Assist level: (P) Touching or steadying assistance (Pt.75%)   Transfers Chair/bed transfer   Chair/bed transfer method: Stand pivot Chair/bed transfer assist level: Touching or steadying assistance (Pt > 75%) Chair/bed transfer assistive device: Armrests, Medical sales representative     Max distance: 140' Assist level: Touching or steadying assistance (Pt > 75%)   Wheelchair   Type: Manual Max wheelchair distance: 120 ft  Assist Level: Supervision or verbal cues  Cognition Comprehension Comprehension assist level: Follows complex conversation/direction with no assist  Expression Expression assist level: Expresses complex ideas: With no assist  Social Interaction Social Interaction assist level: Interacts appropriately with others - No medications needed.  Problem Solving Problem solving assist level: Solves complex problems: Recognizes & self-corrects  Memory Memory assist level: Complete Independence: No helper   Medical Problem List and Plan: 1. Paraplegia and functional deficitssecondary to T12 myelopathy  Cont CIR therapies 2. DVT Prophylaxis/Anticoagulation: Mechanical: Sequential compression devices, below knee Bilateral lower extremities 3. Pain Management: Improving. Continue prn hydrocodone/flexeril.  4. Mood: team to Provide ego support. LCSW to follow for evaluation and support.  5. Neuropsych: This patient iscapable of making decisions on herown behalf. 6. Skin/Wound Care: Routine pressure relief measures. Monitor wound daily.  7. Fluids/Electrolytes/Nutrition:encourage fluids  -Good intake.   8. HTN: Monitor BP bid. Continue Toprol.   Improved overalll, Lisinopril 5 started 7/29---titrate up if needed    9. H/o  urinary incontinence/retention: recurrence of retention last night  -Continue Flomax (home med) but increase to bid  -increased urecholine to 25mg ---monitor for pattern---   -ucx neg 10. Chronic dysphagia/GERD: On Protonix. 11. Leucocytosis: Resolved  UA neg. ucs neg 12. Prediabetes:   Cont to monitor 13. H/o Asthma with SOB: On dulera with albuterol prn. Encourage IS every hour.   -cxr unremarkable.    -encouraged IS/OOB 14. Anxiety d/o: Managed with Serax at nights--ativan substituted. Slept well.    LOS (Days) 9 A FACE TO FACE EVALUATION WAS PERFORMED  Meredith Staggers, MD 12/30/2016 9:45 AM

## 2016-12-30 NOTE — Progress Notes (Addendum)
Physical Therapy Weekly Progress Note  Patient Details  Name: Kim Mcgee MRN: [id number] Date of Birth: Mar 02, 1941  Beginning of progress report period: December 22, 2016 End of progress report period: December 30, 2016  Today's Date: 12/30/2016 PT Individual Time: 0808-0900 PT Individual Time Calculation (min): 52 min   Patient has met 3 of 4 short term goals.  Progressing toward 4th goal. Pt min assist with most bed mobility. Requires mod assist for supine>sit on flat surface. Pt requires supervision and verbal cues while ambulating. Pt performs transfers with supervision with most surfaces. Occasionally needs min assist when transferring from lower surfaces. Would recommend one family education session with husband before discharge due to anticipation that pt will need supervision at discharge.  Patient continues to demonstrate the following deficits muscle weakness and muscle paralysis, decreased cardiorespiratoy endurance and impaired timing and sequencing, motor apraxia, decreased coordination and decreased motor planning and therefore will continue to benefit from skilled PT intervention to increase functional independence with mobility.  Patient progressing toward long term goals..  Continue plan of care.  PT Short Term Goals Week 1:  PT Short Term Goal 1 (Week 1): Pt will perform bed mobility minA PT Short Term Goal 1 - Progress (Week 1): Progressing toward goal PT Short Term Goal 2 (Week 1): Pt will perform transfers with S PT Short Term Goal 2 - Progress (Week 1): Met PT Short Term Goal 3 (Week 1): Pt will ambulate 100' with minA and LRAD PT Short Term Goal 3 - Progress (Week 1): Met PT Short Term Goal 4 (Week 1): Pt will initiate stair training PT Short Term Goal 4 - Progress (Week 1): Met Week 2:  PT Short Term Goal 1 (Week 2): =LTG due to estimated length of stay  Skilled Therapeutic Interventions/Progress Updates:  Pt received in bed eating breakfast. Asked for a few minutes to  finish breakfast. PT returned and pt agreeable to therapy. Pt toileted with supervision transferring on and off toilet. Max assist with dressing and shoes for time management. Pt provided hygiene with supervision only. Pt washed hands standing at sink with supervision. Pt ambulated 150' with RW and supervision. Minimal cues for upright posture and decreased UE use. Sit to supine on mat with min assist for LE. Pt performed clam shells 3 sets, 20 reps and bridges 3 sets, 20 reps. Min assist with rolling on mat to initiate roll. Pt required mod assist for side lying to sit for trunk control. Reported some "dizziness" with position change from supine to sit. BP 109/41. Symptoms resolved after a few minutes. Pt performed lateral step ups for 5 minutes in parallel bars with UE support and close supervision to work on bilateral hip abduction and extension. Pt returned to room in wheelchair by total assist; remains seated in wheelchair, all needs in reach.    Therapy Documentation Precautions:  Precautions Precautions: Back, Fall Precaution Comments: Reviewed back precations Restrictions Weight Bearing Restrictions: No  Pain: Pain Assessment Pain Assessment: No/denies pain   See Function Navigator for Current Functional Status.  Therapy/Group: Individual Therapy  Gwinda Passe, SPT 12/30/2016, 12:03 PM

## 2016-12-30 NOTE — Progress Notes (Signed)
Occupational Therapy Session Note  Patient Details  Name: Kim Mcgee MRN: [id number] Date of Birth: 1940/10/05  Today's Date: 12/30/2016 OT Individual Time: 6546-5035 OT Individual Time Calculation (min): 75 min    Short Term Goals: Week 2:  OT Short Term Goal 1 (Week 2): STG=LTG secondary to ELOS  Skilled Therapeutic Interventions/Progress Updates:    Pt received sitting up in w/c ready for OT tx session. Pt completes sit<>stand at RW with MinA, ambulates with RW to bathroom with minGuard assist, completes transfer to toilet with MinA. Min steady assist during clothing management and perihygiene after voiding bladder. Pt required increased assist to stand from toilet, x3 attempts with  ModA to complete on 3rd attempt. Pt stands at sink to wash hands with minguard assist. Transported Pt to therapy gym via w/c totalA for energy conservation where Pt engaged in standing activity of checkers. Pt completes 4 rounds of standing throughout activity completion with multimodal cues for maintaining upright posture in standing and with seated rest breaks throughout PRN. Requires MinA for sit<>stand during activity, and minguard during standing with MinA provided during increased challenge having Pt stand without UE support. Transported Pt to dayroom where Pt completed 11 min on NuStep for increased endurance and activity tolerance with MinA for stand pivot w/c<>nustep using RW. Transported Pt back to room end of session in manner described above where Pt was left seated in w/c, granddaughter and spouse present, call bell and needs within reach.   Therapy Documentation Precautions:  Precautions Precautions: Back, Fall Precaution Comments: Reviewed back precations Restrictions Weight Bearing Restrictions: No   Pain: Pain Assessment Pain Assessment: Faces Faces Pain Scale: Hurts a little bit Pain Type: Acute pain Pain Location: Back Pain Orientation: Right;Mid Pain Descriptors / Indicators:  Aching;Sore Pain Intervention(s): Cold applied;Rest;Repositioned ADL: ADL ADL Comments: refer to functional navigator  See Function Navigator for Current Functional Status.   Therapy/Group: Individual Therapy  [name] 12/30/2016, 3:57 PM

## 2016-12-30 NOTE — Progress Notes (Signed)
Occupational Therapy Session Note  Patient Details  Name: Kim Mcgee MRN: [id number] Date of Birth: 09-12-1940  Today's Date: 12/30/2016 OT Individual Time: 1100-1155 OT Individual Time Calculation (min): 55 min    Short Term Goals: Week 2:  OT Short Term Goal 1 (Week 2): STG=LTG secondary to ELOS  Skilled Therapeutic Interventions/Progress Updates:    Pt engaged in BADL retraining including bathing at shower level and dressing with sit<>stand from w/c.  Pt amb with RW to use toilet and transfer to shower seat.  Pt requires steady A when standing and assistance with bathing buttocks.  Pt uses AE appropriately for LB bathing/dressing tasks.  Pt required assistance with donning L shoe with shoe funnel.  Pt stood at sink to brush teeth.  Focus on activity tolerance, sit<>stand, standing balance, functional amb with RW, and safety awareness to increase independence with BADLs.  Therapy Documentation Precautions:  Precautions Precautions: Back, Fall Precaution Comments: Reviewed back precations Restrictions Weight Bearing Restrictions: No Pain: Pain Assessment Pain Assessment: No/denies pain  See Function Navigator for Current Functional Status.   Therapy/Group: Individual Therapy  Leroy Libman 12/30/2016, 12:02 PM

## 2016-12-31 ENCOUNTER — Inpatient Hospital Stay (HOSPITAL_COMMUNITY): Payer: Medicare Other

## 2016-12-31 ENCOUNTER — Inpatient Hospital Stay (HOSPITAL_COMMUNITY): Payer: Medicare Other | Admitting: Physical Therapy

## 2016-12-31 MED ORDER — LISINOPRIL 10 MG PO TABS
10.0000 mg | ORAL_TABLET | Freq: Every day | ORAL | Status: DC
Start: 2017-01-01 — End: 2017-01-04
  Administered 2017-01-01 – 2017-01-04 (×4): 10 mg via ORAL
  Filled 2016-12-31 (×4): qty 1

## 2016-12-31 MED ORDER — DM-GUAIFENESIN ER 30-600 MG PO TB12
1.0000 | ORAL_TABLET | Freq: Two times a day (BID) | ORAL | Status: DC
  Administered 2016-12-31 – 2017-01-04 (×9): 1 via ORAL
  Filled 2016-12-31 (×9): qty 1

## 2016-12-31 NOTE — Progress Notes (Signed)
[facility]IC[facility] & REHABILITATION     PROGRESS NOTE    Subjective/Complaints: Still having occasional wheezing, cough/runny nose. Emptied bladder better yesterday  ROS: pt denies nausea, vomiting, diarrhea, cough, shortness of breath or chest pain   Objective: Vital Signs: Blood pressure (!) 164/53, pulse (!) 58, temperature (!) 97.5 F (36.4 C), temperature source Oral, resp. rate 18, height 5\' 2"  (1.575 m), weight 77.3 kg (170 lb 8 oz), SpO2 99 %. No results found. No results for input(s): WBC, HGB, HCT, PLT in the last 72 hours. No results for input(s): NA, K, CL, GLUCOSE, BUN, CREATININE, CALCIUM in the last 72 hours.  Invalid input(s): CO CBG (last 3)  No results for input(s): GLUCAP in the last 72 hours.  Wt Readings from Last 3 Encounters:  12/24/16 77.3 kg (170 lb 8 oz)  12/15/16 74.4 kg (164 lb)  12/04/16 74.4 kg (164 lb)    Physical Exam:  Gen: NAD. Vital signs reviewed.  HENT: Normocephalicand atraumatic.  Eyes: EOMI. No discharge.  Cardiovascular: RRR without murmur. No JVD  Respiratory: CTA Bilaterally with occasional upper airway sound/wheeze. Normal effort    GI: Soft. Bowel sounds are normal.  Musculoskeletal: She exhibits no edemaor tenderness.  Neurological: She is alertand oriented.  Motor: B/l UE 5/5 proximal to distal.  LLE: 3-/5 prox to 3/5 distal--stable RLE: 2+/5 prox to 4/5 distal--stable  Skin: Skin is warmand dry. backi incision clean and dry.  Psychiatric: She has a normal mood and affect. Her behavior is normal. Judgmentand thought contentnormal.    Assessment/Plan: 1. 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 Physiatrist is providing close team supervision and 24 hour management of active medical problems listed below. Physiatrist and rehab team continue to assess barriers to discharge/monitor  patient progress toward functional and medical goals.  Function:  Bathing Bathing position   Position: Shower  Bathing parts Body parts bathed by patient: Right arm, Left arm, Chest, Abdomen, Front perineal area, Right upper leg, Left upper leg, Right lower leg, Left lower leg Body parts bathed by helper: Buttocks  Bathing assist Assist Level: Touching or steadying assistance(Pt > 75%)      Upper Body Dressing/Undressing Upper body dressing   What is the patient wearing?: Pull over shirt/dress     Pull over shirt/dress - Perfomed by patient: Thread/unthread right sleeve, Thread/unthread left sleeve, Put head through opening, Pull shirt over trunk          Upper body assist Assist Level: More than reasonable time   Set up : To obtain clothing/put away  Lower Body Dressing/Undressing Lower body dressing   What is the patient wearing?: Pants, Shoes Underwear - Performed by patient: Thread/unthread right underwear leg, Thread/unthread left underwear leg, Pull underwear up/down Underwear - Performed by helper: Thread/unthread right underwear leg, Thread/unthread left underwear leg Pants- Performed by patient: Thread/unthread right pants leg, Thread/unthread left pants leg, Pull pants up/down Pants- Performed by helper: Pull pants up/down Non-skid slipper socks- Performed by patient: Don/doff right sock, Don/doff left sock (50%) Non-skid slipper socks- Performed by helper: Don/doff right sock, Don/doff left sock Socks - Performed by patient: Don/doff right sock, Don/doff left sock   Shoes - Performed by patient: Don/doff right shoe Shoes - Performed by helper: Don/doff left shoe, Don/doff right shoe          Lower body assist Assist for lower body dressing: Supervision or verbal cues  Toileting Toileting   Toileting steps completed by patient: Performs perineal hygiene, Adjust clothing prior to toileting, Adjust clothing after toileting Toileting steps completed by  helper: Adjust clothing after toileting Toileting Assistive Devices: Grab bar or rail  Toileting assist Assist level: Touching or steadying assistance (Pt.75%)   Transfers Chair/bed transfer   Chair/bed transfer method: Ambulatory Chair/bed transfer assist level: Supervision or verbal cues Chair/bed transfer assistive device: Walker, Air cabin crew     Max distance: 150 Assist level: Supervision or verbal cues   Wheelchair   Type: Manual Max wheelchair distance: 120 ft  Assist Level: Supervision or verbal cues  Cognition Comprehension Comprehension assist level: Follows complex conversation/direction with no assist  Expression Expression assist level: Expresses complex ideas: With no assist  Social Interaction Social Interaction assist level: Interacts appropriately with others - No medications needed.  Problem Solving Problem solving assist level: Solves complex problems: Recognizes & self-corrects  Memory Memory assist level: Complete Independence: No helper   Medical Problem List and Plan: 1. Paraplegia and functional deficitssecondary to T12 myelopathy  Cont CIR therapies 2. DVT Prophylaxis/Anticoagulation: Mechanical: Sequential compression devices, below knee Bilateral lower extremities 3. Pain Management: Improving. Continue prn hydrocodone/flexeril.  4. Mood: team to Provide ego support. LCSW to follow for evaluation and support.  5. Neuropsych: This patient iscapable of making decisions on herown behalf. 6. Skin/Wound Care: Routine pressure relief measures. Monitor wound daily.  7. Fluids/Electrolytes/Nutrition:encourage fluids  -Good intake.   8. HTN: Monitor BP bid. Continue Toprol.   Improved overalll, Lisinopril 5 started 7/29---titrate to 10mg  today    9. H/o urinary incontinence/retention: recurrence of retention last night  -Continue Flomax (home med) which we increased to bid  -increased urecholine to 25mg ---monitor for  pattern---   -ucx neg 10. Chronic dysphagia/GERD: On Protonix. 11. Leucocytosis: Resolved  UA neg. ucs neg 12. Prediabetes:   Cont to monitor 13. H/o Asthma with SOB: On dulera with albuterol prn. Encourage IS every hour.   -cxr unremarkable.    -encouraged IS/OOB  -added scheduled mucinex in addition to prn robitussin liquid 14. Anxiety d/o: Managed with Serax at nights--ativan substituted. Slept well.    LOS (Days) 10 A Bowie T, MD 12/31/2016 10:31 AM

## 2016-12-31 NOTE — Progress Notes (Signed)
Occupational Therapy Session Note  Patient Details  Name: Kim Mcgee MRN: [id number] Date of Birth: 20-Aug-1940  Today's Date: 12/31/2016 OT Individual Time: 1000-1100 OT Individual Time Calculation (min): 60 min    Short Term Goals: Week 2:  OT Short Term Goal 1 (Week 2): STG=LTG secondary to ELOS  Skilled Therapeutic Interventions/Progress Updates:    Pt engaged in ongoing BADL retraining including bathing at shower level, dressing with sit<>stand from w/c, and toileting tasks.  Pt amb with Rollator to bathroom to use toilet prior to transferring to shower seat.  Pt requires steady A when standing in shower and functional amb.  Pt was able to complete all dressing tasks using AE without assistance this morning.  Pt continues to fatigue easily and requires multiple rest breaks during session.  Pt required min verbal cues for safety awareness with Rollator.  This was an initial attempt to use Rollator in close quarters.  Pt remained in w/c with all needs within reach.   Therapy Documentation Precautions:  Precautions Precautions: Back, Fall Precaution Comments: Reviewed back precations Restrictions Weight Bearing Restrictions: No Pain: Pain Assessment Pain Assessment: No/denies pain See Function Navigator for Current Functional Status.   Therapy/Group: Individual Therapy  Leroy Libman 12/31/2016, 12:07 PM

## 2016-12-31 NOTE — Progress Notes (Signed)
Physical Therapy Session Note  Patient Details  Name: Meliss Fleek MRN: [id number] Date of Birth: 06-01-1940  Today's Date: 12/31/2016 PT Individual Time: 0825-0910 PT Individual Time Calculation (min): 45 min   Short Term Goals: Week 2:  PT Short Term Goal 1 (Week 2): =LTG due to estimated length of stay  Skilled Therapeutic Interventions/Progress Updates:    Session focused on functional use of rollator with cues for safety (when to lock brakes and positioning) during functional mobility, transfer to toilet for bathroom needs (steady assist for balance for clothing management with 1 UE support), neuro re-ed to address BLE coordination and strengthening including alternating toe taps (4" step) and step ups with 2" step x 10 reps each BLE (min assist overall for balance). Rest breaks as needed due to fatigue. Dynamic gait through obstacle course to simulate home and community mobility with close supervision to steadying assist during turns and cues for upright posture and to decrease reliance on UE's. Gait back to room as described above for functional strengthening and endurance with rollator.   Therapy Documentation Precautions:  Precautions Precautions: Back, Fall Precaution Comments: Reviewed back precations Restrictions Weight Bearing Restrictions: No   Pain: Premedicated for back pain.    See Function Navigator for Current Functional Status.   Therapy/Group: Individual Therapy  Canary Brim Ivory Broad, PT, DPT  12/31/2016, 9:16 AM

## 2016-12-31 NOTE — Progress Notes (Signed)
Occupational Therapy Note  Patient Details  Name: Kim Mcgee MRN: [id number] Date of Birth: 1940-09-17  Today's Date: 12/31/2016 OT Individual Time: 1400-1430 OT Individual Time Calculation (min): 30 min   Pt c/o R posterior upper thoracic pain; RN aware and med admin Individual Therapy  Pt resting in w/c upon arrival.  Pt amb with Rollator to day room and engaged in dynamic standing activities with focus on reaching outside of BOS to retrieve objects.  Pt employed stand-step strategy to retrieve objects.  Pt also engaged in mini squats to increase independence with toileting tasks.  Pt amb 178' with Rollator back to room and returned to w/c with family present.   Leotis Shames [facility] 12/31/2016, 2:46 PM

## 2016-12-31 NOTE — Progress Notes (Signed)
Physical Therapy Session Note  Patient Details  Name: Angelita Harnack MRN: [id number] Date of Birth: 19-Jul-1940  Today's Date: 12/31/2016 PT Individual Time: 0700-0800 PT Individual Time Calculation (min): 60 min   Short Term Goals: Week 2:  PT Short Term Goal 1 (Week 2): =LTG due to estimated length of stay  Skilled Therapeutic Interventions/Progress Updates: Pt received supine in bed, c/o pain 5/10 in R side and reports improving after receiving pain medication. Supine>sit with bedrails and S. Pt performs upper and lower body dressing with setupA, use of reacher to thread LLE into pants, and S with RW for standing balance while pulling up pants. Standing at sink pt performs oral hygiene and grooming. Gait to day room with RW and S x150'. Performed nustep x10 min on level 3 progressed to level 5 with average 40 steps/min for strengthening and aerobic endurance. Educated pt on use of aerobic equipment at home, recommendations for aerobic exercise 150 min/week divided as pt able to tolerate with progression towards 20-30 min bouts. Gait to gym with rollator; note improved upright posture and gait speed compared to RW and pt demonstrates appropriate safety with minimal cueing for use of brakes. Side stepping R/L in parallel bars 3x10' each LE with tactile cueing at L glute med/max for stance control and hip abduction strengthening. Gait with rollator to return to room; remained seated in w/c at end of session all needs in reach.      Therapy Documentation Precautions:  Precautions Precautions: Back, Fall Precaution Comments: Reviewed back precations Restrictions Weight Bearing Restrictions: No Pain: Pain Assessment Pain Assessment: 0-10 Pain Score: 5  Pain Type: Acute pain Pain Location: Back Pain Orientation: Right Pain Descriptors / Indicators: Aching;Sore Pain Frequency: Intermittent Pain Onset: Gradual   See Function Navigator for Current Functional Status.   Therapy/Group:  Individual Therapy  Luberta Mutter 12/31/2016, 7:53 AM

## 2017-01-01 ENCOUNTER — Inpatient Hospital Stay (HOSPITAL_COMMUNITY): Payer: Medicare Other | Admitting: Physical Therapy

## 2017-01-01 NOTE — Progress Notes (Signed)
Physical Therapy Session Note  Patient Details  Name: Kim Mcgee MRN: [id number] Date of Birth: 11/03/1940  Today's Date: 01/01/2017 PT Individual Time: 0900-0953 PT Individual Time Calculation (min): 53 min   Short Term Goals: Week 1:  PT Short Term Goal 1 (Week 1): Pt will perform bed mobility minA PT Short Term Goal 1 - Progress (Week 1): Progressing toward goal PT Short Term Goal 2 (Week 1): Pt will perform transfers with S PT Short Term Goal 2 - Progress (Week 1): Met PT Short Term Goal 3 (Week 1): Pt will ambulate 100' with minA and LRAD PT Short Term Goal 3 - Progress (Week 1): Met PT Short Term Goal 4 (Week 1): Pt will initiate stair training PT Short Term Goal 4 - Progress (Week 1): Met Week 2:  PT Short Term Goal 1 (Week 2): =LTG due to estimated length of stay  Skilled Therapeutic Interventions/Progress Updates:   Pt in w/c upon arrival and agreeable to therapy, no c/o pain.   Ambulated to/from therapy gym w/ rollator and supervision to increase endurance. Verbal cues to increase BOS and decrease R hip drop w/ L single leg stance. Static standing balance for 5 min w/ bilateral UE support and supervision while speaking w/ MD.   Performed TE to work on LE strength bilaterally, focus on isolating musculature to improve quality of gait:  -Resisted hamstring curls 2x10 - orange TB   -Resisted DF/PF 2x10 - orange TB  -Resisted hip abduction 2x10 - orange TB   -Resisted hip adduction 2x10 - orange TB   Notable increase in R/L dorsiflexion during swing phase and decreased R hip drop while ambulating back to room.   Ended session in w/c, call bell within reach and all needs met.   Therapy Documentation Precautions:  Precautions Precautions: Back, Fall Precaution Comments: Reviewed back precations Restrictions Weight Bearing Restrictions: No Vital Signs: Therapy Vitals Temp: 98.1 F (36.7 C) Temp Source: Oral Pulse Rate: 65 Resp: (!) 25 BP: (!) 141/54 Patient  Position (if appropriate): Lying Oxygen Therapy SpO2: 99 % O2 Device: Not Delivered Pain: Pain Assessment Pain Assessment: No/denies pain Pain Score: 0-No pain  See Function Navigator for Current Functional Status.   Therapy/Group: Individual Therapy  Niasha Devins K Arnette 01/01/2017, 9:56 AM

## 2017-01-01 NOTE — Progress Notes (Signed)
[place]     PROGRESS NOTE    Subjective/Complaints:  Some mid back pain , LEs feel cold at noc, denies hx of PVD, hx of multiple lumbar surgeries  ROS: pt denies nausea, vomiting, diarrhea, cough, shortness of breath or chest pain   Objective: Vital Signs: Blood pressure (!) 141/54, pulse 65, temperature 98.1 F (36.7 C), temperature source Oral, resp. rate (!) 25, height 5\' 2"  (1.575 m), weight 77.3 kg (170 lb 8 oz), SpO2 99 %. No results found. No results for input(s): WBC, HGB, HCT, PLT in the last 72 hours. No results for input(s): NA, K, CL, GLUCOSE, BUN, CREATININE, CALCIUM in the last 72 hours.  Invalid input(s): CO CBG (last 3)  No results for input(s): GLUCAP in the last 72 hours.  Wt Readings from Last 3 Encounters:  12/24/16 77.3 kg (170 lb 8 oz)  12/15/16 74.4 kg (164 lb)  12/04/16 74.4 kg (164 lb)    Physical Exam:  Gen: NAD. Vital signs reviewed.  HENT: Normocephalicand atraumatic.  Eyes: EOMI. No discharge.  Cardiovascular: RRR without murmur. No JVD  Respiratory: CTA Bilaterally with occasional upper airway sound/wheeze. Normal effort    GI: Soft. Bowel sounds are normal.  Musculoskeletal: She exhibits no edemaor tenderness.  Neurological: She is alertand oriented.  Motor: B/l UE 5/5 proximal to distal.  LLE: 3-/5 prox to 3/5 distal--stable RLE: 3+/5 prox to 4/5 distal--stable  Skin: Skin is warmand dry. backi incision clean and dry.  Psychiatric: She has a normal mood and affect. Her behavior is normal. Judgmentand thought contentnormal.    Assessment/Plan: 1. 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 Physiatrist is providing close team supervision and 24 hour management of active medical problems listed below. Physiatrist and rehab team continue to assess barriers to  discharge/monitor patient progress toward functional and medical goals.  Function:  Bathing Bathing position   Position: Shower  Bathing parts Body parts bathed by patient: Right arm, Left arm, Chest, Abdomen, Front perineal area, Right upper leg, Left upper leg, Right lower leg, Left lower leg Body parts bathed by helper: Buttocks  Bathing assist Assist Level: Touching or steadying assistance(Pt > 75%)      Upper Body Dressing/Undressing Upper body dressing   What is the patient wearing?: Pull over shirt/dress     Pull over shirt/dress - Perfomed by patient: Thread/unthread right sleeve, Thread/unthread left sleeve, Put head through opening, Pull shirt over trunk          Upper body assist Assist Level: More than reasonable time   Set up : To obtain clothing/put away  Lower Body Dressing/Undressing Lower body dressing   What is the patient wearing?: Underwear, Pants, Socks, Shoes Underwear - Performed by patient: Thread/unthread right underwear leg, Thread/unthread left underwear leg, Pull underwear up/down Underwear - Performed by helper: Thread/unthread right underwear leg, Thread/unthread left underwear leg Pants- Performed by patient: Thread/unthread right pants leg, Thread/unthread left pants leg, Pull pants up/down, Fasten/unfasten pants Pants- Performed by helper: Pull pants up/down Non-skid slipper socks- Performed by patient: Don/doff right sock, Don/doff left sock (50%) Non-skid slipper socks- Performed by helper: Don/doff right sock, Don/doff left sock Socks - Performed by patient: Don/doff right sock, Don/doff left sock   Shoes - Performed by patient: Don/doff right shoe, Don/doff left shoe Shoes - Performed by helper: Don/doff left shoe, Don/doff right shoe  Lower body assist Assist for lower body dressing: Touching or steadying assistance (Pt > 75%)      Toileting Toileting   Toileting steps completed by patient: Performs perineal hygiene, Adjust  clothing prior to toileting, Adjust clothing after toileting Toileting steps completed by helper: Adjust clothing after toileting Toileting Assistive Devices: Grab bar or rail  Toileting assist Assist level: Touching or steadying assistance (Pt.75%)   Transfers Chair/bed transfer   Chair/bed transfer method: Ambulatory Chair/bed transfer assist level: Supervision or verbal cues Chair/bed transfer assistive device: Walker, Air cabin crew     Max distance: 150 Assist level: Supervision or verbal cues   Wheelchair   Type: Manual Max wheelchair distance: 120 ft  Assist Level: Supervision or verbal cues  Cognition Comprehension Comprehension assist level: Follows complex conversation/direction with no assist  Expression Expression assist level: Expresses complex ideas: With no assist  Social Interaction Social Interaction assist level: Interacts appropriately with others - No medications needed.  Problem Solving Problem solving assist level: Solves complex problems: Recognizes & self-corrects  Memory Memory assist level: Complete Independence: No helper   Medical Problem List and Plan: 1. Paraplegia and functional deficitssecondary to T12 myelopathy  Cont CIR therapies- excellent progress amb with walker2. DVT Prophylaxis/Anticoagulation: Mechanical: Sequential compression devices, below knee Bilateral lower extremities 3. Pain Management: Improving. Continue prn hydrocodone/flexeril.  4. Mood: team to Provide ego support. LCSW to follow for evaluation and support.  5. Neuropsych: This patient iscapable of making decisions on herown behalf. 6. Skin/Wound Care: Routine pressure relief measures. Monitor wound daily.  7. Fluids/Electrolytes/Nutrition:encourage fluids  -Good intake.   8. HTN: Monitor BP bid. Continue Toprol.   Improved overall, Lisinopril 10mg  8/3    9. H/o urinary incontinence/retention: Improved  -Continue Flomax (home med) which we  increased to bid  -increased urecholine to 25mg ---monitor for pattern---    10. Chronic dysphagia/GERD: On Protonix. 11. Leucocytosis: Resolved  UA neg. ucs neg  12. H/o Asthma with SOB: On dulera with albuterol prn. Encourage IS every hour.   -cxr unremarkable.    -encouraged IS/OOB  -added scheduled mucinex in addition to prn robitussin liquid 13. Anxiety d/o: Managed with Serax at nights--ativan substituted. Slept well.    LOS (Days) 11 A FACE TO FACE EVALUATION WAS PERFORMED  Charlett Blake, MD 01/01/2017 9:13 AM

## 2017-01-02 ENCOUNTER — Inpatient Hospital Stay (HOSPITAL_COMMUNITY): Payer: Medicare Other

## 2017-01-02 LAB — GLUCOSE, CAPILLARY: GLUCOSE-CAPILLARY: 75 mg/dL (ref 65–99)

## 2017-01-02 NOTE — Progress Notes (Signed)
Occupational Therapy Session Note  Patient Details  Name: Kim Mcgee MRN: [id number] Date of Birth: May 02, 1941  Today's Date: 01/02/2017 OT Individual Time: 1100-3496 OT Individual Time Calculation (min): 56 min    Skilled Therapeutic Interventions/Progress Updates:    1:1. Pt with no c/o pain. Pt with NT upon entering room finishing toileting. Pt agreeable to tx. Pt ambulates with rollator with supervision and min VC for locking breaks throughout session. Pt completes 2x couch transfers with touching A for balance. OT discusses walk in shower set up at home and  Demonstrates safe ambulatory transfer to shower seated with walk in shower using posterior method. Pt return demonstrates with supervision. Pt participates in discussion of positioning of grab bars in bathroom and OT provided diagram of positioning/placement based on client description so pt could have discussion with husband. In tx gym, pt stands with 1 seated rest break to play game of connect four with no UE support. Pt with 1 LOB anteriorly while reaching forward, however pt able to correct without A. Pt returned to room, and OT exited session with pt seated in w/c with call light in reach and all needs met.   Therapy Documentation Precautions:  Precautions Precautions: Back, Fall Precaution Comments: Reviewed back precations Restrictions Weight Bearing Restrictions: No See Function Navigator for Current Functional Status.   Therapy/Group: Individual Therapy  Tonny Branch 01/02/2017, 3:09 PM

## 2017-01-02 NOTE — Progress Notes (Addendum)
[facility]     PROGRESS NOTE    Subjective/Complaints:   LEs feel cold at noc but not during the day, denies hx of PVD, hx of multiple lumbar surgeries  ROS: pt denies nausea, vomiting, diarrhea, cough, shortness of breath or chest pain   Objective: Vital Signs: Blood pressure (!) 127/51, pulse 62, temperature 98.1 F (36.7 C), temperature source Oral, resp. rate 18, height 5\' 2"  (1.575 m), weight 78.6 kg (173 lb 4 oz), SpO2 99 %. No results found. No results for input(s): WBC, HGB, HCT, PLT in the last 72 hours. No results for input(s): NA, K, CL, GLUCOSE, BUN, CREATININE, CALCIUM in the last 72 hours.  Invalid input(s): CO CBG (last 3)   Recent Labs  01/02/17 0643  GLUCAP 75    Wt Readings from Last 3 Encounters:  01/01/17 78.6 kg (173 lb 4 oz)  12/15/16 74.4 kg (164 lb)  12/04/16 74.4 kg (164 lb)    Physical Exam:  Gen: NAD. Vital signs reviewed.  HENT: Normocephalicand atraumatic.  Eyes: EOMI. No discharge.  Cardiovascular: RRR without murmur. No JVD  Respiratory: CTA Bilaterally with occasional upper airway sound/wheeze. Normal effort    GI: Soft. Bowel sounds are normal.  Musculoskeletal: She exhibits no edemaor tenderness.  Neurological: She is alertand oriented.  Motor: B/l UE 5/5 proximal to distal.  LLE: 3-/5 prox to 3/5 distal--stable RLE: 3+/5 prox to 4/5 distal--stable  Skin: Skin is warmand dry. backi incision clean and dry.  Psychiatric: She has a normal mood and affect. Her behavior is normal. Judgmentand thought contentnormal.    Assessment/Plan: 1. 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 Physiatrist is providing close team supervision and 24 hour management of active medical problems listed below. Physiatrist and rehab team continue to assess barriers to discharge/monitor patient  progress toward functional and medical goals.  Function:  Bathing Bathing position   Position: Shower  Bathing parts Body parts bathed by patient: Right arm, Left arm, Chest, Abdomen, Front perineal area, Right upper leg, Left upper leg, Right lower leg, Left lower leg Body parts bathed by helper: Buttocks  Bathing assist Assist Level: Touching or steadying assistance(Pt > 75%)      Upper Body Dressing/Undressing Upper body dressing   What is the patient wearing?: Pull over shirt/dress     Pull over shirt/dress - Perfomed by patient: Thread/unthread right sleeve, Thread/unthread left sleeve, Put head through opening, Pull shirt over trunk          Upper body assist Assist Level: More than reasonable time   Set up : To obtain clothing/put away  Lower Body Dressing/Undressing Lower body dressing   What is the patient wearing?: Underwear, Pants, Socks, Shoes Underwear - Performed by patient: Thread/unthread right underwear leg, Thread/unthread left underwear leg, Pull underwear up/down Underwear - Performed by helper: Thread/unthread right underwear leg, Thread/unthread left underwear leg Pants- Performed by patient: Thread/unthread right pants leg, Thread/unthread left pants leg, Pull pants up/down, Fasten/unfasten pants Pants- Performed by helper: Pull pants up/down Non-skid slipper socks- Performed by patient: Don/doff right sock, Don/doff left sock (50%) Non-skid slipper socks- Performed by helper: Don/doff right sock, Don/doff left sock Socks - Performed by patient: Don/doff right sock, Don/doff left sock   Shoes - Performed by patient: Don/doff right shoe, Don/doff left shoe Shoes - Performed by helper: Don/doff left shoe, Don/doff right shoe  Lower body assist Assist for lower body dressing: Touching or steadying assistance (Pt > 75%)      Toileting Toileting   Toileting steps completed by patient: Performs perineal hygiene, Adjust clothing after toileting,  Adjust clothing prior to toileting Toileting steps completed by helper: Adjust clothing prior to toileting Toileting Assistive Devices: Grab bar or rail  Toileting assist Assist level: Touching or steadying assistance (Pt.75%)   Transfers Chair/bed transfer   Chair/bed transfer method: Ambulatory Chair/bed transfer assist level: Supervision or verbal cues Chair/bed transfer assistive device: Walker, Air cabin crew     Max distance: 150 Assist level: Supervision or verbal cues   Wheelchair   Type: Manual Max wheelchair distance: 120 ft  Assist Level: Supervision or verbal cues  Cognition Comprehension Comprehension assist level: Follows complex conversation/direction with extra time/assistive device, Follows complex conversation/direction with no assist  Expression Expression assist level: Expresses complex ideas: With no assist  Social Interaction Social Interaction assist level: Interacts appropriately with others - No medications needed.  Problem Solving Problem solving assist level: Solves complex problems: With extra time, Solves complex problems: Recognizes & self-corrects  Memory Memory assist level: Complete Independence: No helper   Medical Problem List and Plan: 1. Paraplegia and functional deficitssecondary to T12 myelopathy  Cont CIR therapies- excellent progress amb with walker2. DVT Prophylaxis/Anticoagulation: Mechanical: Sequential compression devices, below knee Bilateral lower extremities 3. Pain Management: Improving. Continue prn hydrocodone/flexeril.  4. Mood: team to Provide ego support. LCSW to follow for evaluation and support.  5. Neuropsych: This patient iscapable of making decisions on herown behalf. 6. Skin/Wound Care: Routine pressure relief measures. Monitor wound daily.  7. Fluids/Electrolytes/Nutrition:encourage fluids  -Good intake.   8. HTN: Monitor BP bid. Continue Toprol.   Improved overall, Lisinopril 10mg  8/3     9. H/o urinary incontinence/retention: Improved  -Continue Flomax (home med) which we increased to bid  -increased urecholine to 25mg ---monitor for pattern---    10. Chronic dysphagia/GERD: On Protonix. 11. Leucocytosis: Resolved  UA neg. ucs neg  12. H/o Asthma with SOB: On dulera with albuterol prn. Encourage IS every hour.   -cxr unremarkable.    -encouraged IS/OOB  -added scheduled mucinex in addition to prn robitussin liquid 13. Anxiety d/o: Managed with Serax at nights--ativan substituted. Slept well.  14.  LE parasthesia, has varicose veins but good LE pulses  LOS (Days) 12 A FACE TO FACE EVALUATION WAS PERFORMED  Charlett Blake, MD 01/02/2017 9:44 AM

## 2017-01-03 ENCOUNTER — Inpatient Hospital Stay (HOSPITAL_COMMUNITY): Payer: Medicare Other | Admitting: Occupational Therapy

## 2017-01-03 ENCOUNTER — Encounter (HOSPITAL_COMMUNITY): Payer: Medicare Other

## 2017-01-03 ENCOUNTER — Ambulatory Visit (HOSPITAL_COMMUNITY): Payer: Medicare Other | Admitting: Physical Therapy

## 2017-01-03 ENCOUNTER — Inpatient Hospital Stay (HOSPITAL_COMMUNITY): Payer: Medicare Other | Admitting: Physical Therapy

## 2017-01-03 LAB — CBC WITH DIFFERENTIAL/PLATELET
BASOS ABS: 0 10*3/uL (ref 0.0–0.1)
BASOS PCT: 0 %
EOS ABS: 0.1 10*3/uL (ref 0.0–0.7)
Eosinophils Relative: 1 %
HEMATOCRIT: 37.3 % (ref 36.0–46.0)
HEMOGLOBIN: 11.9 g/dL — AB (ref 12.0–15.0)
Lymphocytes Relative: 23 %
Lymphs Abs: 1.8 10*3/uL (ref 0.7–4.0)
MCH: 29.4 pg (ref 26.0–34.0)
MCHC: 31.9 g/dL (ref 30.0–36.0)
MCV: 92.1 fL (ref 78.0–100.0)
MONO ABS: 0.6 10*3/uL (ref 0.1–1.0)
Monocytes Relative: 7 %
NEUTROS ABS: 5.3 10*3/uL (ref 1.7–7.7)
Neutrophils Relative %: 69 %
Platelets: 179 10*3/uL (ref 150–400)
RBC: 4.05 MIL/uL (ref 3.87–5.11)
RDW: 16.4 % — AB (ref 11.5–15.5)
WBC: 7.7 10*3/uL (ref 4.0–10.5)

## 2017-01-03 LAB — GLUCOSE, CAPILLARY
GLUCOSE-CAPILLARY: 125 mg/dL — AB (ref 65–99)
GLUCOSE-CAPILLARY: 111 mg/dL — AB (ref 65–99)
GLUCOSE-CAPILLARY: 117 mg/dL — AB (ref 65–99)

## 2017-01-03 MED ORDER — LIDOCAINE 5 % EX PTCH
1.0000 | MEDICATED_PATCH | CUTANEOUS | Status: DC
  Administered 2017-01-04: 1 via TRANSDERMAL
  Filled 2017-01-03: qty 1

## 2017-01-03 NOTE — Progress Notes (Signed)
Occupational Therapy Session Note  Patient Details  Name: Kim Mcgee MRN: [id number] Date of Birth: Mar 25, 1941  Today's Date: 01/03/2017 OT Individual Time: 1100-1200 OT Individual Time Calculation (min): 60 min    Short Term Goals: Week 2:  OT Short Term Goal 1 (Week 2): STG=LTG secondary to ELOS  Skilled Therapeutic Interventions/Progress Updates:    Pt resting in w/c upon arrival with husband present.  Pt's husband participated in bathing/dressing tasks.  Pt/husband verbalized understanding of recommendation for 24 hour supervision and assistance (steady A) when ambulating with Rollator.  Discussed shower layout and practiced walk-in shower transfers.  Pt engaged in bathing/dressing task with husband providing appropriate level of assistance.  Pt uses AE appropriately to assist with LB bathing/dressing tasks.  PA notified of husband's concerns re: raised area at top of incision.  PA attended to needs and answered questions.  Pt remained in w/c with all needs within reach.   Therapy Documentation Precautions:  Precautions Precautions: Fall, Back Precaution Comments: Reviewed back precations Restrictions Weight Bearing Restrictions: No Pain: Pain Assessment Pain Assessment: No/denies pain Pain Score: 3  Pain Type: Acute pain Pain Location: Back Pain Orientation: Right Pain Descriptors / Indicators: Aching Pain Frequency: Intermittent Pain Onset: On-going Patients Stated Pain Goal: 3 Pain Intervention(s): ;Repositioned    See Function Navigator for Current Functional Status.   Therapy/Group: Individual Therapy  Leroy Libman 01/03/2017, 12:15 PM

## 2017-01-03 NOTE — Discharge Summary (Signed)
Physician Discharge Summary  Patient ID: Kim Mcgee MRN: [id number] DOB/AGE: December 23, 1940 76 y.o.  Admit date: 12/21/2016 Discharge date: 01/04/2017  Discharge Diagnoses:  Principal Problem:   Cord compression myelopathy [facility]) Active Problems:   Paraparesis (HCC)   Hypertension   Leukocytosis   Benign essential HTN   Prediabetes   Neurogenic bladder   UTI (urinary tract infection)   Discharged Condition: stable   Significant Diagnostic Studies: Dg Chest 2 View  Result Date: 12/21/2016 CLINICAL DATA:  Shortness of breath EXAM: CHEST  2 VIEW COMPARISON:  12/17/2016 FINDINGS: Lower cervical hardware. Partially visualized thoracolumbar spinal rods and fixating screws. No acute consolidation or pleural effusion. Mild cardiomegaly. No pneumothorax. Degenerative osteophytes of the spine. IMPRESSION: 1. No acute infiltrate or edema 2. Borderline to mild cardiomegaly Electronically Signed   By: Donavan Foil M.D.   On: 12/21/2016 21:07      Labs:  Basic Metabolic Panel: BMP Latest Ref Rng & Units 12/27/2016 12/22/2016 12/19/2016  Glucose 65 - 99 mg/dL 122(H) 136(H) 171(H)  BUN 6 - 20 mg/dL 17 21(H) 20  Creatinine 0.44 - 1.00 mg/dL 0.64 0.66 0.63  Sodium 135 - 145 mmol/L 135 139 138  Potassium 3.5 - 5.1 mmol/L 4.4 4.4 4.1  Chloride 101 - 111 mmol/L 102 103 102  CO2 22 - 32 mmol/L 27 29 29   Calcium 8.9 - 10.3 mg/dL 8.3(L) 8.4(L) 8.7(L)    CBC: CBC Latest Ref Rng & Units 01/03/2017 12/22/2016 12/19/2016  WBC 4.0 - 10.5 K/uL 7.7 9.0 12.4(H)  Hemoglobin 12.0 - 15.0 g/dL 11.9(L) 11.6(L) 11.9(L)  Hematocrit 36.0 - 46.0 % 37.3 36.3 36.4  Platelets 150 - 400 K/uL 179 269 255    CBG:  Recent Labs Lab 01/03/17 1219 01/03/17 1628 01/03/17 2035 01/04/17 0626 01/04/17 1202  GLUCAP 125* 111* 117* 85 109*    Brief HPI:   Kim Mcgee a 76 y.o.femalewith history of CAD, SOB, HTN, laryngospasm's, chronic dysphagia, progressive back pain radiating to BLE and LLE weakness for 3-4 weeks  with left foot drop who was treated with [facility] 12/13/16. She was admitted on 7/18 with increase in pain and fall due to BLE giving away. 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 She was taken to OR on 7/19 for decompressive laminectomy T12-L1 with removal of large synovial cyst and decompression of T12 and L1 nerve roots with removal of loose L1 screws and arthrodesis as well as repair of inadvertent dural tear by Dr. Saintclair Halsted. Post op on bedrest till 7/22. She has had issues with confusion and progressive dyspnea without hypoxia--IS recommended. pain control improving and she was started on decadron taper. Therapy ongoing and patient with significant deficits in mobility and ADL tasks. CIR was recommended for follow up therapy   Hospital Course: Kim Mcgee was admitted to rehab 12/21/2016 for inpatient therapies to consist of PT, ST and OT at least three hours five days a week. Past admission physiatrist, therapy team and rehab RN have worked together to provide customized collaborative inpatient rehab. Decadron has been tapered off and pain control is improving. CXR was done past admission due to reports of SOB and showed NAD.  She was encouraged to use IS every hour while awake. BLE dopplers were checked  and was negative for DVT.  She was started on bowel program and voiding was monitored with PVR checks. She was found to have urinary retention therefore urecholine was added and titrated  upwards.  UA/UCS done to rule out infection and this showed no growth.  She has been voiding in the past week without difficulty but on day of discharge she had difficulty voiding in early requiring I&O cath for 400 cc. She was able to void X 2 without retention prior to discharge but UA showed evidence of pyuria therefore she was started on septra for UTI pending culture results. Husband was educated on I/O caths, need  to monitor fluid intake/output and cath prn discomfort or recurrent  difficulty voiding.   Follow up CBC showed ABLA to be stable and reactive leucocytosis has resolved.  She was noted to be dehydrated and was encouraged to increase fluid intake. Follow up labs showed lytes to be WNL.  Labs showed fasting hyperglycemia and Hgb A1C-5.8. She was educated by dietician on carb modified diet as well as prediabetes.  Back incision is clean, dry and intact without erythema. She did report swelling at proximal aspect of incision felt to be due to seroma. CBC was rechecked and no leucocytosis noted. Dr. Ellene Route evaluated wound and recommended follow up in office in a week.  She has had improvement in BLE strength and coordination. She has progressed to supervision at wheelchair level. She will continue to receive follow up outpatient PT at [facility].    Rehab course: During patient's stay in rehab weekly team conferences were held to monitor patient's progress, set goals and discuss barriers to discharge. At admission, patient required min assist with mobility and mod assist with basic self care tasks. She has had improvement in activity tolerance, balance, postural control, as well as ability to compensate for deficits. She is has had improvement in functional use RLE and LLE.  She is able to complete ADL sks at modified independent to supervision level. She is requires min guard assist for transfers and is ambulating 200' with min assist and RW. Family education was done with husband regarding all aspects of care.     Disposition: 01-Home or Self Care  Diet: Regular.   Special Instructions: 1. No bending, twisting or arching. Do not lift items over 5 lbs. 2. Cath 1-2 times a day if unable to urinate   Discharge Instructions    Ambulatory referral to Physical Medicine Rehab    Complete by:  As directed    1-2 weeks transitional care appt     Allergies as of 01/04/2017       Reactions   Lyrica [pregabalin]    Confused/agitated   Macrodantin [nitrofurantoin] Other (See Comments)   Chest pain   Morphine And Related Nausea Only   CAUSED THE DRY HEAVES; CANNOT TAKE AGAIN!!   Penicillins Rash, Other (See Comments)   Chest pain, also Has patient had a PCN reaction causing immediate rash, facial/tongue/throat swelling, SOB or lightheadedness with hypotension: Yes Has patient had a PCN reaction causing severe rash involving mucus membranes or skin necrosis: No Has patient had a PCN reaction that required hospitalization: No Has patient had a PCN reaction occurring within the last 10 years: Yes If all of the above answers are "NO", then may proceed with Cephalosporin use.   Tape Rash   No adhesive tape, but COBAN WRAP IS TOLERATED      Medication List    STOP taking these medications   dexamethasone 4 MG tablet Commonly known as:  DECADRON   ondansetron 4 MG disintegrating tablet Commonly known as:  ZOFRAN ODT   oxazepam 15 MG capsule Commonly known as:  SERAX Replaced by:  LORazepam 1 MG tablet   pregabalin 75 MG capsule Commonly known as:  LYRICA     TAKE these medications   acetaminophen 325 MG tablet Commonly known as:  TYLENOL Take 325-650 mg by mouth every 6 (six) hours as needed (for pain or headaches).   albuterol 108 (90 Base) MCG/ACT inhaler Commonly known as:  PROVENTIL HFA;VENTOLIN HFA Inhale 2 puffs into the lungs every 6 (six) hours as needed for shortness of breath.   bethanechol 25 MG tablet Commonly known as:  URECHOLINE Take 1 tablet (25 mg total) by mouth 3 (three) times daily.   cyclobenzaprine 10 MG tablet Commonly known as:  FLEXERIL Take 0.5 tablets (5 mg total) by mouth 3 (three) times daily as needed for muscle spasms. What changed:  how much to take   docusate sodium 100 MG capsule Commonly known as:  COLACE Take 1 capsule (100 mg total) by mouth 2 (two) times daily.   fexofenadine 180 MG tablet Commonly known  as:  ALLEGRA Take 180 mg by mouth daily as needed for allergies.   FISH OIL PO Take 1 capsule by mouth daily.   Fluticasone-Salmeterol 500-50 MCG/DOSE Aepb Commonly known as:  ADVAIR Inhale 1 puff into the lungs 2 (two) times daily as needed (for seasonal "flares").   gabapentin 300 MG capsule Commonly known as:  NEURONTIN Take 1 capsule (300 mg total) by mouth 3 (three) times daily.   HYDROcodone-acetaminophen 5-325 MG tablet--Rx # 30 pills  Commonly known as:  NORCO/VICODIN Take 1 tablet by mouth every 12 (twelve) hours as needed for severe pain. What changed:  when to take this  reasons to take this   levothyroxine 112 MCG tablet Commonly known as:  SYNTHROID, LEVOTHROID Take 112 mcg by mouth daily before breakfast.   lidocaine 5 % Commonly known as:  LIDODERM Place 1 patch onto the skin daily. Apply at 6 am and remove at 6 pm dialy. Available over the counter.   lisinopril 10 MG tablet Commonly known as:  PRINIVIL,ZESTRIL Take 1 tablet (10 mg total) by mouth daily.   LORazepam 1 MG tablet--Rx 3 30 pills  Commonly known as:  ATIVAN Take 1 tablet (1 mg total) by mouth at bedtime. Replaces:  oxazepam 15 MG capsule   metoprolol succinate 50 MG 24 hr tablet Commonly known as:  TOPROL-XL Take 1 tablet (50 mg total) by mouth 2 (two) times daily.   MUSCLE RUB 10-15 % Crea Apply 1 application topically as needed for muscle pain. To back 2-3 times a day for pain   nitroGLYCERIN 0.4 MG SL tablet Commonly known as:  NITROSTAT Place 0.4 mg under the tongue every 5 (five) minutes x 3 doses as needed for chest pain.   pantoprazole 40 MG tablet Commonly known as:  PROTONIX Take 40 mg by mouth daily before breakfast.   polyethylene glycol packet Commonly known as:  MIRALAX / GLYCOLAX Take 17 g by mouth daily.   rosuvastatin 5 MG tablet Commonly known as:  CRESTOR Take 5 mg by mouth at bedtime.   sulfamethoxazole-trimethoprim 400-80 MG tablet Commonly known as:   BACTRIM,SEPTRA Take 1 tablet by mouth every 12 (twelve) hours.   tamsulosin 0.4 MG Caps capsule Commonly known as:  FLOMAX Take 1 capsule (0.4 mg total) by mouth 2 (two) times daily. What changed:  when to take this      Follow-up Information    Meredith Staggers, MD Follow up.   Specialty:  Physical Medicine and  Rehabilitation Why:  office will call you with  follow up appointment Contact information: [address] [place] [address] [phone]        Kary Kos, MD. Call on 01/20/2017.   Specialty:  Neurosurgery Why:  appointment at 10:30 am with Dr. Dione Booze to call you with reschedule.  Contact information: [address] [place] [address] [phone]        Kennieth Rad, MD Follow up on 01/13/2017.   Why:  @ 10:00 am (hospital follow up appt) Contact information: [facility] [address] [place] [address] [phone]           Signed: Bary Leriche 01/05/2017, 7:47 PM

## 2017-01-03 NOTE — Plan of Care (Signed)
Problem: RH KNOWLEDGE DEFICIT GENERAL Goal: RH STG INCREASE KNOWLEDGE OF SELF CARE AFTER HOSPITALIZATION Pt and significant other will demonstrate increased knowledge of self-care after hospitalization with min assistance using resources.

## 2017-01-03 NOTE — Progress Notes (Signed)
Pt had a fall. NT assisted pt to the floor and witness fall. Charge nurse assessed pt and reported no injuries. Vitals were taken. MD notified. Pt A&O x 4. Pt said her leg gave out when NT was assisting her out of the bathroom. Pt refused to have family contacted

## 2017-01-03 NOTE — Progress Notes (Signed)
Physical Therapy Discharge Summary  Patient Details  Name: Kim Mcgee MRN: [id number] Date of Birth: Oct 24, 1940  Today's Date: 01/03/2017 PT Individual Time:815-900, 1300-1400 PT Individual Time Calculation (min): 60 min, 45 min   Patient has met 2 of 11 long term goals due to improved activity tolerance, improved balance, increased strength, improved awareness and improved coordination.  Patient to discharge at an ambulatory level [place].   Patient's care partner is independent to provide the necessary physical assistance at discharge.  Reasons goals not met: Due to fall this morning pt functioning at min assist for safety. Pt husband able to provide proper physical assistance necessary for discharge.   Recommendation:  Patient will benefit from ongoing skilled PT services in outpatient setting to continue to advance safe functional mobility, address ongoing impairments in balance, strength, gait, and minimize fall risk.  Equipment: rollator  Reasons for discharge: Pt's progress on goals is adequate for discharge. Her husband able to provide necessary physical assistance.  Patient/family agrees with progress made and goals achieved: Yes  PT Discharge Precautions/Restrictions Precautions Precautions: Fall;Back Restrictions Weight Bearing Restrictions: No Pain Pt denies pain Vision/Perception  Vision - Assessment Additional Comments: No visual deficits effecting mobility  Cognition Orientation Level: Oriented X4 Memory: Appears intact Awareness: Appears intact Problem Solving: Impaired Problem Solving Impairment: Functional complex (cues for management with AD) Safety/Judgment: Appears intact Sensation Sensation Light Touch: Impaired Detail Light Touch Impaired Details: Impaired LLE (Can detect light touch but diminished compared to RLE) Proprioception: Impaired by gross assessment (Tested WNL but pt reports difficulty feeling when foot is in shoe.  ) Coordination Coordination and Movement Description: Coordination impaired with alternating toe taps; slow speed Heel Shin Test: Pt with slow movement during heel shin test and unable to fully move through range; requires assistance to place heel on shin bilaterally Motor  Motor Motor: Motor apraxia Motor - Skilled Clinical Observations: LE weakness  Mobility Bed Mobility Bed Mobility: Rolling Right;Rolling Left;Sit to Supine;Supine to Sit Rolling Right: 5: Supervision Rolling Right Details: Verbal cues for sequencing Rolling Left: 5: Supervision Rolling Left Details: Verbal cues for sequencing Supine to Sit: 4: Min guard Sit to Supine: 3: Mod assist (LE management) Sit to Supine - Details: Verbal cues for technique Transfers Transfers: Yes Sit to Stand: 4: Min guard Stand to Sit: 4: Min guard Stand to Sit Details (indicate cue type and reason): Verbal cues for technique Stand Pivot Transfers: 4: Min guard Locomotion  Ambulation Ambulation: Yes Ambulation/Gait Assistance: 4: Min guard Ambulation Distance (Feet): 200 Feet Assistive device: Rollator Ambulation/Gait Assistance Details: Tactile cues for posture;Verbal cues for gait pattern Ambulation/Gait Assistance Details: Pt previously demonstrated supervision with ambulation overall; increased to min guard during ambulation due to buckling/fall this morning Gait Gait: Yes Gait Pattern: Impaired Gait Pattern: Poor foot clearance - right;Decreased stride length;Narrow base of support;Poor foot clearance - left;Lateral hip instability Gait velocity: 0.56 feet/second Stairs / Additional Locomotion Stairs: Yes Stairs Assistance: 4: Min guard Stair Management Technique: Two rails;Step to pattern (froward ascend, backward descend ) Number of Stairs: 4 Height of Stairs: 3 Ramp: 4: Min assist Curb: 4: Min assist (with rollator ) Architect: Yes Wheelchair Assistance: 5: Supervision Wheelchair  Propulsion: Both upper extremities  Trunk/Postural Assessment  Postural Control Postural Control: Within Functional Limits  Balance Balance Balance Assessed: Yes Standardized Balance Assessment Standardized Balance Assessment: Timed Up and Go Test Timed Up and Go Test TUG: Normal TUG Normal TUG (seconds): 62 Static Sitting Balance Static Sitting - Level  of Assistance: 6: Modified independent (Device/Increase time) Dynamic Sitting Balance Dynamic Sitting - Balance Support: During functional activity Dynamic Sitting - Level of Assistance: 6: Modified independent (Device/Increase time) Static Standing Balance Static Standing - Balance Support: Bilateral upper extremity supported Static Standing - Level of Assistance: 5: Stand by assistance Dynamic Standing Balance Dynamic Standing - Balance Support: Bilateral upper extremity supported Dynamic Standing - Level of Assistance: 4: Min assist Dynamic Standing - Balance Activities: Other (comment) (functionally assessed at sink) Extremity Assessment  RUE Assessment RUE Assessment: Within Functional Limits LUE Assessment LUE Assessment: Within Functional Limits RLE Strength Right Hip Flexion: 4/5 Right Hip ABduction: 4-/5 Right Hip ADduction: 4/5 Right Knee Flexion: 4/5 Right Knee Extension: 4/5 Right Ankle Dorsiflexion: 4-/5 Right Ankle Plantar Flexion: 4/5 LLE Assessment LLE Assessment: Exceptions to [facility] LLE Strength Left Hip Flexion: 3-/5 Left Hip ABduction: 3+/5 Left Hip ADduction: 4/5 Left Knee Flexion: 3+/5 Left Knee Extension: 4/5 Left Ankle Dorsiflexion: 3-/5 Left Ankle Plantar Flexion: 4-/5  Therapy Intervention  Session 1: Pt received in chair eating breakfast, she asked for a few minutes to finish. Pt agreeable to therapy. Re-assessed MMT, sensation, and proprioception. Pt walked to gym with min guard and rollator. Pt reported feeling more anxious about walking after fall this morning. Min guard on all transfers and  ambulation for safety. Pt navigated up/down 4, 3" stairs with min guard. Pt performed car transfer with minimal cues and min guard for safety. Pt ambulated 150 feet back toward room with rollator & min guard. Returned total assist in wheelchair the rest of the way. Left sitting up in wheelchair, call bell & phone in reach.   Session 2: Pt received sitting up in wheelchair with husband. Pt and husband agreeable to therapy and family education. Education provided throughout session for transfers, gait, bed mobility, curbs, steps, and overall safety for home environment. Pt's husband provided appropriate assistance for each task. Observed pt and husband perform toileting, hand hygiene, bed mobility, car transfer, and recliner transfer. Demonstrated and gave verbal cues for navigating curbs, step up threshold, and uneven surfaces with rollator. Husband also provided appropriate assist with ambulation. Review back precautions with pt and husband. Provided pt & husband with handout on home safety. Pt returned to room in wheelchair with husband providing total assist. All needs in reach.   See Function Navigator for Current Functional Status.  Gwinda Passe, SPT 01/03/2017, 8:09 AM

## 2017-01-03 NOTE — Progress Notes (Signed)
Occupational Therapy Session Note  Patient Details  Name: Donyea Gafford MRN: [id number] Date of Birth: 29-Dec-1940  Today's Date: 01/03/2017 OT Individual Time: 0900-0930 OT Individual Time Calculation (min): 30 min    Short Term Goals: Week 2:  OT Short Term Goal 1 (Week 2): STG=LTG secondary to ELOS  Skilled Therapeutic Interventions/Progress Updates:    Upon entering the room, pt seated in wheelchair awaiting therapist. Pt with no c/o pain this session. Pt verbalized experiencing a fall earlier this morning. OT discussed potential fall risks at home including her pet. Pt verbalized agreement and understanding. Pt provided with paper handout regarding energy conservation techniques for self care, community, and home management. OT provided examples and strategies based on pt's needs at discharge. Pt actively participated in education. Pt remained seated in wheelchair with RN entering the room as therapist exited.   Therapy Documentation Precautions:  Precautions Precautions: Fall, Back Precaution Comments: Reviewed back precations Restrictions Weight Bearing Restrictions: No General: General PT Missed Treatment Reason: Other (Comment) (pt requesting to eat breakfast) Vital Signs: Therapy Vitals Temp: 97.8 F (36.6 C) Temp Source: Oral Pulse Rate: 70 Resp: 18 BP: (!) 99/41 Patient Position (if appropriate): Sitting Oxygen Therapy SpO2: 99 % O2 Device: Not Delivered Pain: Pain Assessment Pain Assessment: No/denies pain Pain Score: 3  Pain Type: Acute pain Pain Location: Back Pain Orientation: Right Pain Descriptors / Indicators: Aching Pain Frequency: Intermittent Pain Onset: On-going Patients Stated Pain Goal: 3 Pain Intervention(s): Medication (See eMAR);Repositioned ADL: ADL ADL Comments: refer to functional navigator  See Function Navigator for Current Functional Status.   Therapy/Group: Individual Therapy  Gypsy Decant 01/03/2017, 12:32 PM

## 2017-01-03 NOTE — Progress Notes (Signed)
Patient with fluid collection at proximal aspect of incision--no erythema or drainage but mild tenderness. Incision intact and healing well. Family with concerns and reassured likely seroma. Will check CBC. Left message at [facility] office for Dr. Saintclair Halsted to evaluate incision prior to discharge tomorrow.

## 2017-01-03 NOTE — Progress Notes (Signed)
[facility]IC[facility] & REHABILITATION     PROGRESS NOTE    Subjective/Complaints:  No issues overnite  ROS: pt denies nausea, vomiting, diarrhea, cough, shortness of breath or chest pain   Objective: Vital Signs: Blood pressure (!) 109/52, pulse 61, temperature 97.9 F (36.6 C), temperature source Oral, resp. rate 18, height 5\' 2"  (1.575 m), weight 78.9 kg (173 lb 14.4 oz), SpO2 97 %. No results found. No results for input(s): WBC, HGB, HCT, PLT in the last 72 hours. No results for input(s): NA, K, CL, GLUCOSE, BUN, CREATININE, CALCIUM in the last 72 hours.  Invalid input(s): CO CBG (last 3)   Recent Labs  01/02/17 0643  GLUCAP 75    Wt Readings from Last 3 Encounters:  01/02/17 78.9 kg (173 lb 14.4 oz)  12/15/16 74.4 kg (164 lb)  12/04/16 74.4 kg (164 lb)    Physical Exam:  Gen: NAD. Vital signs reviewed.  HENT: Normocephalicand atraumatic.  Eyes: EOMI. No discharge.  Cardiovascular: RRR without murmur. No JVD  Respiratory: CTA Bilaterally with occasional upper airway sound/wheeze. Normal effort    GI: Soft. Bowel sounds are normal.  Musculoskeletal: She exhibits no edemaor tenderness.  Neurological: She is alertand oriented.  Motor: B/l UE 5/5 proximal to distal.  LLE: 3-/5 prox to 3/5 distal--stable RLE: 3+/5 prox to 4/5 distal--stable  Skin: Skin is warmand dry. backi incision clean and dry.  Psychiatric: She has a normal mood and affect. Her behavior is normal. Judgmentand thought contentnormal.    Assessment/Plan: 1. 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 Physiatrist is providing close team supervision and 24 hour management of active medical problems listed below. Physiatrist and rehab team continue to assess barriers to discharge/monitor patient progress toward functional and medical  goals.  Function:  Bathing Bathing position   Position: Shower  Bathing parts Body parts bathed by patient: Right arm, Left arm, Chest, Abdomen, Front perineal area, Right upper leg, Left upper leg, Right lower leg, Left lower leg Body parts bathed by helper: Buttocks  Bathing assist Assist Level: Touching or steadying assistance(Pt > 75%)      Upper Body Dressing/Undressing Upper body dressing   What is the patient wearing?: Pull over shirt/dress     Pull over shirt/dress - Perfomed by patient: Thread/unthread right sleeve, Thread/unthread left sleeve, Put head through opening, Pull shirt over trunk          Upper body assist Assist Level: More than reasonable time   Set up : To obtain clothing/put away  Lower Body Dressing/Undressing Lower body dressing   What is the patient wearing?: Underwear, Pants, Socks, Shoes Underwear - Performed by patient: Thread/unthread right underwear leg, Thread/unthread left underwear leg, Pull underwear up/down Underwear - Performed by helper: Thread/unthread right underwear leg, Thread/unthread left underwear leg Pants- Performed by patient: Thread/unthread right pants leg, Thread/unthread left pants leg, Pull pants up/down, Fasten/unfasten pants Pants- Performed by helper: Pull pants up/down Non-skid slipper socks- Performed by patient: Don/doff right sock, Don/doff left sock (50%) Non-skid slipper socks- Performed by helper: Don/doff right sock, Don/doff left sock Socks - Performed by patient: Don/doff right sock, Don/doff left sock   Shoes - Performed by patient: Don/doff right shoe, Don/doff left shoe Shoes - Performed by helper: Don/doff left shoe, Don/doff right shoe          Lower body assist Assist for lower body dressing: Touching or steadying assistance (  Pt > 75%)      Toileting Toileting   Toileting steps completed by patient: Adjust clothing prior to toileting, Adjust clothing after toileting, Performs perineal  hygiene Toileting steps completed by helper: Adjust clothing prior to toileting Toileting Assistive Devices: Grab bar or rail  Toileting assist Assist level: Touching or steadying assistance (Pt.75%)   Transfers Chair/bed transfer   Chair/bed transfer method: Ambulatory Chair/bed transfer assist level: Supervision or verbal cues Chair/bed transfer assistive device: Environmental consultant, Air cabin crew     Max distance: 150 Assist level: Supervision or verbal cues   Wheelchair   Type: Manual Max wheelchair distance: 120 ft  Assist Level: Supervision or verbal cues  Cognition Comprehension Comprehension assist level: Follows complex conversation/direction with no assist  Expression Expression assist level: Expresses complex ideas: With no assist  Social Interaction Social Interaction assist level: Interacts appropriately with others - No medications needed.  Problem Solving Problem solving assist level: Solves complex problems: With extra time  Memory Memory assist level: Complete Independence: No helper   Medical Problem List and Plan: 1. Paraplegia and functional deficitssecondary to T12 myelopathy  Cont CIR therapies- leg buckled due to weakness and reduced proprioception 2. DVT Prophylaxis/Anticoagulation: Mechanical: Sequential compression devices, below knee Bilateral lower extremities 3. Pain Management: Improving. Continue prn hydrocodone/flexeril.  4. Mood: team to Provide ego support. LCSW to follow for evaluation and support.  5. Neuropsych: This patient iscapable of making decisions on herown behalf. 6. Skin/Wound Care: Routine pressure relief measures. Monitor wound daily.  7. Fluids/Electrolytes/Nutrition:encourage fluids  -Good intake.   8. HTN: Monitor BP bid. Continue Toprol.  , Lisinopril 10mg  8/3, controlled  Vitals:   01/03/17 0529 01/03/17 0615  BP: (!) 126/56 (!) 109/52  Pulse: 62 61  Resp: 18 18  Temp: 97.9 F (36.6 C) 97.9 F (36.6 C)       9. H/o urinary incontinence/retention: Improved  -Continue Flomax (home med) which we increased to bid  -increased urecholine to 25mg ---monitor for pattern---    10. Chronic dysphagia/GERD: On Protonix. 11. Leucocytosis: Resolved  UA neg. ucs neg  12. H/o Asthma with SOB: On dulera with albuterol prn. Encourage IS every hour.   -cxr unremarkable.    -encouraged IS/OOB  -added scheduled mucinex in addition to prn robitussin liquid 13. Anxiety d/o: Managed with Serax at nights--ativan substituted. Slept well.  14.  LE parasthesia, has varicose veins but good LE pulses  LOS (Days) 13 A FACE TO FACE EVALUATION WAS PERFORMED  Charlett Blake, MD 01/03/2017 7:57 AM

## 2017-01-04 LAB — URINALYSIS, ROUTINE W REFLEX MICROSCOPIC
BILIRUBIN URINE: NEGATIVE
GLUCOSE, UA: NEGATIVE mg/dL
HGB URINE DIPSTICK: NEGATIVE
KETONES UR: NEGATIVE mg/dL
NITRITE: NEGATIVE
PH: 6 (ref 5.0–8.0)
Protein, ur: NEGATIVE mg/dL
Specific Gravity, Urine: 1.011 (ref 1.005–1.030)

## 2017-01-04 LAB — GLUCOSE, CAPILLARY
GLUCOSE-CAPILLARY: 85 mg/dL (ref 65–99)
Glucose-Capillary: 109 mg/dL — ABNORMAL HIGH (ref 65–99)

## 2017-01-04 MED ORDER — GABAPENTIN 300 MG PO CAPS: 300.0000 mg | ORAL_CAPSULE | Freq: Three times a day (TID) | ORAL | 0 refills | Status: DC

## 2017-01-04 MED ORDER — BETHANECHOL CHLORIDE 25 MG PO TABS: 25.0000 mg | ORAL_TABLET | Freq: Three times a day (TID) | ORAL | 0 refills | Status: DC

## 2017-01-04 MED ORDER — HYDROCODONE-ACETAMINOPHEN 5-325 MG PO TABS: 1.0000 | ORAL_TABLET | Freq: Two times a day (BID) | ORAL | 0 refills | Status: DC | PRN

## 2017-01-04 MED ORDER — DOCUSATE SODIUM 100 MG PO CAPS: 100.0000 mg | ORAL_CAPSULE | Freq: Two times a day (BID) | ORAL | 0 refills | Status: DC

## 2017-01-04 MED ORDER — SULFAMETHOXAZOLE-TRIMETHOPRIM 400-80 MG PO TABS: 1.0000 | ORAL_TABLET | Freq: Two times a day (BID) | ORAL | 0 refills | Status: DC

## 2017-01-04 MED ORDER — CYCLOBENZAPRINE HCL 10 MG PO TABS: 5.0000 mg | ORAL_TABLET | Freq: Three times a day (TID) | ORAL | 0 refills | Status: DC | PRN

## 2017-01-04 MED ORDER — TAMSULOSIN HCL 0.4 MG PO CAPS: 0.4000 mg | ORAL_CAPSULE | Freq: Two times a day (BID) | ORAL | 0 refills | Status: AC

## 2017-01-04 MED ORDER — SULFAMETHOXAZOLE-TRIMETHOPRIM 400-80 MG PO TABS
1.0000 | ORAL_TABLET | Freq: Two times a day (BID) | ORAL | Status: DC
  Administered 2017-01-04: 1 via ORAL
  Filled 2017-01-04: qty 1

## 2017-01-04 MED ORDER — LORAZEPAM 1 MG PO TABS: 1.0000 mg | ORAL_TABLET | Freq: Every day | ORAL | 0 refills | Status: DC

## 2017-01-04 MED ORDER — LIDOCAINE 5 % EX PTCH: 1.0000 | MEDICATED_PATCH | CUTANEOUS | 0 refills | Status: DC

## 2017-01-04 MED ORDER — LISINOPRIL 10 MG PO TABS: 10.0000 mg | ORAL_TABLET | Freq: Every day | ORAL | 0 refills | Status: DC

## 2017-01-04 MED ORDER — MUSCLE RUB 10-15 % EX CREA: 1.0000 "application " | TOPICAL_CREAM | CUTANEOUS | 0 refills | Status: DC | PRN

## 2017-01-04 MED ORDER — METOPROLOL SUCCINATE ER 50 MG PO TB24: 50.0000 mg | ORAL_TABLET | Freq: Two times a day (BID) | ORAL | 0 refills | Status: DC

## 2017-01-04 NOTE — Progress Notes (Signed)
[facility] PHYSICAL MEDICINE & REHABILITATION     PROGRESS NOTE    Subjective/Complaints: Seen by Dr Ellene Route yesterday Episode of retention last noc req cath   ROS: pt denies nausea, vomiting, diarrhea, cough, shortness of breath or chest pain   Objective: Vital Signs: Blood pressure (!) 134/58, pulse 63, temperature 98.1 F (36.7 C), temperature source Oral, resp. rate 17, height 5\' 2"  (1.575 m), weight 78.9 kg (173 lb 14.4 oz), SpO2 98 %. No results found.  Recent Labs  01/03/17 1205  WBC 7.7  HGB 11.9*  HCT 37.3  PLT 179   No results for input(s): NA, K, CL, GLUCOSE, BUN, CREATININE, CALCIUM in the last 72 hours.  Invalid input(s): CO CBG (last 3)   Recent Labs  01/03/17 1628 01/03/17 2035 01/04/17 0626  GLUCAP 111* 117* 85    Wt Readings from Last 3 Encounters:  01/02/17 78.9 kg (173 lb 14.4 oz)  12/15/16 74.4 kg (164 lb)  12/04/16 74.4 kg (164 lb)    Physical Exam:  Gen: NAD. Vital signs reviewed.  HENT: Normocephalicand atraumatic.  Eyes: EOMI. No discharge.  Cardiovascular: RRR without murmur. No JVD  Respiratory: CTA Bilaterally with occasional upper airway sound/wheeze. Normal effort    GI: Soft. Bowel sounds are normal.  Musculoskeletal: She exhibits no edemaor tenderness.  Neurological: She is alertand oriented.  Motor: B/l UE 5/5 proximal to distal.  LLE: 3-/5 prox to 3/5 distal--stable RLE: 3+/5 prox to 4/5 distal--stable  Skin: Skin is warmand dry. backi incision clean and dry.  Psychiatric: She has a normal mood and affect. Her behavior is normal. Judgmentand thought contentnormal.    Assessment/Plan: 1. 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 Stable for D/C today F/u PCP in 3-4 weeks F/u PM&R 2 weeks See D/C summary See D/C instructions Get UA C and S, teach self cath prior to d/c Function:  Bathing Bathing position   Position: Shower  Bathing parts Body parts bathed by patient: Right arm, Left  arm, Chest, Abdomen, Front perineal area, Right upper leg, Left upper leg, Right lower leg, Left lower leg, Buttocks Body parts bathed by helper: Buttocks  Bathing assist Assist Level: More than reasonable time      Upper Body Dressing/Undressing Upper body dressing   What is the patient wearing?: Pull over shirt/dress     Pull over shirt/dress - Perfomed by patient: Thread/unthread right sleeve, Thread/unthread left sleeve, Put head through opening, Pull shirt over trunk          Upper body assist Assist Level: No help, No cues   Set up : To obtain clothing/put away  Lower Body Dressing/Undressing Lower body dressing   What is the patient wearing?: Underwear, Pants, Socks, Shoes Underwear - Performed by patient: Thread/unthread right underwear leg, Thread/unthread left underwear leg, Pull underwear up/down Underwear - Performed by helper: Thread/unthread right underwear leg, Thread/unthread left underwear leg Pants- Performed by patient: Thread/unthread right pants leg, Thread/unthread left pants leg, Pull pants up/down, Fasten/unfasten pants Pants- Performed by helper: Pull pants up/down Non-skid slipper socks- Performed by patient: Don/doff right sock, Don/doff left sock (50%) Non-skid slipper socks- Performed by helper: Don/doff right sock, Don/doff left sock Socks - Performed by patient: Don/doff right sock, Don/doff left sock   Shoes - Performed by patient: Don/doff right shoe, Don/doff left shoe Shoes - Performed by helper: Don/doff left shoe, Don/doff right shoe          Lower body assist Assist for lower body dressing: More than  reasonable time      Toileting Toileting   Toileting steps completed by patient: Adjust clothing prior to toileting, Adjust clothing after toileting, Performs perineal hygiene Toileting steps completed by helper: Adjust clothing prior to toileting Toileting Assistive Devices: Grab bar or rail  Toileting assist Assist level: More than  reasonable time   Transfers Chair/bed transfer   Chair/bed transfer method: Ambulatory Chair/bed transfer assist level: Touching or steadying assistance (Pt > 75%) Chair/bed transfer assistive device: Other (rollator)     Locomotion Ambulation     Max distance: 150 Assist level: Touching or steadying assistance (Pt > 75%)   Wheelchair   Type: Manual Max wheelchair distance: 120 ft  Assist Level: Supervision or verbal cues  Cognition Comprehension Comprehension assist level: Follows complex conversation/direction with no assist  Expression Expression assist level: Expresses complex ideas: With no assist  Social Interaction Social Interaction assist level: Interacts appropriately with others - No medications needed.  Problem Solving Problem solving assist level: Solves complex problems: With extra time  Memory Memory assist level: Complete Independence: No helper   Medical Problem List and Plan: 1. Paraplegia and functional deficitssecondary to T12 myelopathy  Cont CIR therapies- leg buckled due to weakness and reduced proprioception 2. DVT Prophylaxis/Anticoagulation: Mechanical: Sequential compression devices, below knee Bilateral lower extremities 3. Pain Management: Improving. Continue prn hydrocodone/flexeril.  4. Mood: team to Provide ego support. LCSW to follow for evaluation and support.  5. Neuropsych: This patient iscapable of making decisions on herown behalf. 6. Skin/Wound Care: Routine pressure relief measures. wound looks good minimal seroma f/u Neurosurg in 1 wk 7. Fluids/Electrolytes/Nutrition:encourage fluids  -Good intake.   8. HTN: Monitor BP bid. Continue Toprol.  , Lisinopril 10mg  8/3, controlled  Vitals:   01/04/17 0037 01/04/17 0517  BP: (!) 129/45 (!) 134/58  Pulse: 70 63  Resp: 18 17  Temp: 97.9 F (36.6 C) 98.1 F (36.7 C)      9. H/o urinary incontinence/retention: Improved  -Continue Flomax (home med) which we increased to  bid  -increased urecholine to 25mg ---monitor for pattern---    10. Chronic dysphagia/GERD: On Protonix. 11. Leucocytosis: Resolved  UA neg. ucs neg  12. H/o Asthma with SOB: On dulera with albuterol prn. Encourage IS every hour.   -cxr unremarkable.    -encouraged IS/OOB  -added scheduled mucinex in addition to prn robitussin liquid 13. Anxiety d/o: Managed with Serax at nights--ativan substituted. Slept well.  14.  LE parasthesia, due to myelopathy  LOS (Days) 14 A FACE TO FACE EVALUATION WAS PERFORMED  Charlett Blake, MD 01/04/2017 8:05 AM

## 2017-01-04 NOTE — Discharge Instructions (Signed)
Inpatient Rehab Discharge Instructions  Kim Mcgee Discharge date and time:  01/04/17  Activities/Precautions/ Functional Status: Activity: no lifting, driving, or strenuous exercise for till cleared by MD Diet: diabetic diet  Wound Care: keep wound clean and dry. Contact MD if you develop any problems with your incision/wound--redness, swelling, increase in pain, drainage or if you develop fever or chills.    Functional status:  ___ No restrictions     ___ Walk up steps independently _X__ 24/7 supervision/assistance   ___ Walk up steps with assistance ___ Intermittent supervision/assistance  ___ Bathe/dress independently _X__ Walk with walker    _X__ Bathe/dress with assistance ___ Walk Independently    ___ Shower independently ___ Walk with assistance    ___ Shower with assistance _X__ No alcohol     ___ Return to work/school ________    COMMUNITY REFERRALS UPON DISCHARGE:    Outpatient: PT                    Agency:  [facility] Phone: [phone]                Appointment Date/Time:  01/06/17 @ 9:00 am with Anderson Malta  Medical Equipment/Items Ordered: rollator walker                                                     Agency/Supplier:  [facility] @ [phone]   Special Instructions: 1. No bending, twisting or arching. Do not lift items over 5 lbs. 2. Cath 1-2 times a day if unable to urinate   Opioid Overdose Opioids are substances that relieve pain by binding to pain receptors in your brain and spinal cord. Opioids include illegal drugs, such as heroin, as well as prescription pain medicines.An opioid overdose happens when you take too much of an opioid substance. This can happen with any type of opioid, including:  Heroin.  Morphine.  Codeine.  Methadone.  Oxycodone.  Hydrocodone.  Fentanyl.  Hydromorphone.  Buprenorphine.  The effects of an overdose can be mild, dangerous, or even deadly. Opioid overdose is a medical  emergency. What are the causes? This condition may be caused by:  Taking too much of an opioid by accident.  Taking too much of an opioid on purpose.  An error made by a health care provider who prescribes a medicine.  An error made by the pharmacist who fills the prescription order.  Using more than one substance that contains opioids at the same time.  Mixing an opioid with a substance that affects your heart, breathing, or blood pressure. These include alcohol, tranquilizers, sleeping pills, illegal drugs, and some over-the-counter medicines.  What increases the risk? This condition is more likely in:  Children. They may be attracted to colorful pills. Because of a child's small size, even a small amount of a drug can be dangerous.  Elderly people. They may be taking many different drugs. Elderly people may have difficulty reading labels or remembering when they last took their medicine.  People who take an opioid on a long-term basis.  People who use: ? Illegal drugs. ? Other substances, including alcohol, while using an opioid.  People who have: ? A history of drug or alcohol abuse. ? Certain mental health conditions.  People who take opioids that are not prescribed for them.  What are  the signs or symptoms? Symptoms of this condition depend on the type of opioid and the amount that was taken. Common symptoms include:  Sleepiness or difficulty waking from sleep.  Confusion.  Slurred speech.  Slowed breathing and a slow pulse.  Nausea and vomiting.  Abnormally small pupils.  Signs and symptoms that require emergency treatment include:  Cold, clammy, and pale skin.  Blue lips and fingernails.  Vomiting.  Gurgling sounds in the throat.  A pulse that is very slow or difficult to detect.  Breathing that is very slow, noisy, or difficult to detect.  Limp body.  Inability to respond to speech or be awakened from sleep (stupor).  How is this  diagnosed? This condition is diagnosed based on your symptoms. It is important to tell your health care provider:  All of the opioidsthat you took.  When you took the opioids.  Whether you were drinking alcohol or using other substances.  Your health care provider will do a physical exam. This exam may include:  Checking and monitoring your heart rate and rhythm, your breathing rate and depth, your temperature, and your blood pressure (vital signs).  Checking for abnormally small pupils.  Measuring oxygen levels in your blood.  You may also have blood tests or urine tests. How is this treated? Supporting your vital signs and your breathing is the first step in treating an opioid overdose. Treatment may also include:  Giving fluids and minerals (electrolytes) through an IV tube.  Inserting a breathing tube (endotracheal tube) in your airway to help you breathe.  Giving oxygen.  Passing a tube through your nose and into your stomach (NG tube, or nasogastric tube) to wash out your stomach.  Giving medicines that: ? Increase your blood pressure. ? Absorb any opioid that is in your digestive system. ? Reverse the effects of the opioid (naloxone).  Ongoing counseling and mental health support if you intentionally overdosed or used an illegal drug.  Follow these instructions at home:  Take over-the-counter and prescription medicines only as told by your health care provider. Always ask your health care provider about possible side effects and interactions of any new medicine that you start taking.  Keep a list of all of the medicines that you take, including over-the-counter medicines. Bring this list with you to all of your medical visits.  Drink enough fluid to keep your urine clear or pale yellow.  Keep all follow-up visits as told by your health care provider. This is important. How is this prevented?  Get help if you are struggling with: ? Alcohol or drug  use. ? Depression or another mental health problem.  Keep the phone number of your local poison control center near your phone or on your cell phone.  Store all medicines in safety containers that are out of the reach of children.  Read the drug inserts that come with your medicines.  Do not drink alcohol when taking opioids.  Do not use illegal drugs.  Do not take opioid medicines that are not prescribed for you. Contact a health care provider if:  Your symptoms return.  You develop new symptoms or side effects when you are taking medicines. Get help right away if:  You think that you or someone else may have taken too much of an opioid. The hotline of the [facility] is (765) 595-0818.  You or someone else is having symptoms of an opioid overdose.  You have serious thoughts about hurting yourself or others.  You have: ? Chest pain. ? Difficulty breathing. ? A loss of consciousness. Opioid overdose is an emergency. Do not wait to see if the symptoms will go away. Get medical help right away. Call your local emergency services (911 in the [place] Do not drive yourself to the hospital. This information is not intended to replace advice given to you by your health care provider. Make sure you discuss any questions you have with your health care provider. Document Released: 06/24/2004 Document Revised: 10/23/2015 Document Reviewed: 10/31/2014 Elsevier Interactive Patient Education  2018 [employer].   My questions have been answered and I understand these instructions. I will adhere to these goals and the provided educational materials after my discharge from the hospital.  Patient/Caregiver Signature _______________________________ Date __________  Clinician Signature _______________________________________ Date __________  Please bring this form and your medication list with you to all your follow-up doctor's appointments.

## 2017-01-04 NOTE — Progress Notes (Signed)
RN educated husband, Richard on clean technique of in and out cath regarding schedule and technique. Husband properly demonstrated clean technique with minimal assistance from staff. Husband feels comfortable with cathing and RN provided pt and husband with a few cath kits to take home. No further questions at this time regarding cathing.

## 2017-01-04 NOTE — Progress Notes (Signed)
Occupational Therapy Discharge Summary  Patient Details  Name: Kim Mcgee MRN: [id number] Date of Birth: 1940-07-24   Patient has met 8 of 8 long term goals due to improved activity tolerance, improved balance and ability to compensate for deficits.  Pt made steady progress with BADLs and completes all tasks at supervision/mod I level.  Pt requires supervision for all functional transfers. Pt's husband has been present for therapy and provides the appropriate level of supervision/assistance.  Pt uses AE appropriately to complete LB bathing/dressing tasks. Patient to discharge at overall mod I - supervision level.  Patient's care partner is independent to provide the necessary cognitive assistance at discharge.    Recommendation:  Pt will not need follow up Occupational therapy services after discharge at this time.   Equipment: No equipment provided  Reasons for discharge: treatment goals met  Patient/family agrees with progress made and goals achieved: Yes  OT Discharge Vision Baseline Vision/History: No visual deficits Wears Glasses: Reading only Patient Visual Report: No change from baseline Vision Assessment?: No apparent visual deficits Perception  Perception: Within Functional Limits Praxis Praxis: Intact Cognition Overall Cognitive Status: Within Functional Limits for tasks assessed Arousal/Alertness: Awake/alert Orientation Level: Oriented X4 Memory: Appears intact Awareness: Appears intact Problem Solving: Impaired Problem Solving Impairment: Functional complex Safety/Judgment: Appears intact Sensation Sensation Light Touch: Impaired Detail Light Touch Impaired Details: Impaired LLE Stereognosis: Appears Intact Hot/Cold: Appears Intact Proprioception: Impaired by gross assessment Proprioception Impaired Details: Impaired RLE;Impaired LLE Coordination Gross Motor Movements are Fluid and Coordinated: No Fine Motor Movements are Fluid and Coordinated:  Yes Motor  Motor Motor - Skilled Clinical Observations: LE weakness Trunk/Postural Assessment  Cervical Assessment Cervical Assessment: Within Functional Limits Thoracic Assessment Thoracic Assessment: Within Functional Limits Lumbar Assessment Lumbar Assessment: Exceptions to [facility] (flattened lumbar lordosis) Postural Control Postural Control: Within Functional Limits  Balance Static Sitting Balance Static Sitting - Balance Support: Feet supported;Bilateral upper extremity supported Static Sitting - Level of Assistance: 6: Modified independent (Device/Increase time) Dynamic Sitting Balance Dynamic Sitting - Balance Support: During functional activity Dynamic Sitting - Level of Assistance: 6: Modified independent (Device/Increase time) Extremity/Trunk Assessment RUE Assessment RUE Assessment: Within Functional Limits LUE Assessment LUE Assessment: Within Functional Limits   See Function Navigator for Current Functional Status.  Leotis Shames [facility] 01/04/2017, 6:40 AM

## 2017-01-04 NOTE — Progress Notes (Signed)
Patient safety maintained.  Patient denies needs/concerns at this time. 

## 2017-01-04 NOTE — Progress Notes (Signed)
PA Reesa Chew reviewed discharge instructions with pt and husband. Pt escorted to ground floor in wheelchair by NT Jarrett Soho and husband.

## 2017-01-05 DIAGNOSIS — N390 Urinary tract infection, site not specified: Secondary | ICD-10-CM

## 2017-01-06 ENCOUNTER — Telehealth: Payer: Self-pay | Admitting: Physical Medicine and Rehabilitation

## 2017-01-06 LAB — URINE CULTURE

## 2017-01-06 NOTE — Telephone Encounter (Signed)
Attempted to reach patient X 2 to give results of UCS. Urine culture positive for Klebsiella Oxytoca--sensitive to septra which she was discharged on. Left message for patient to call for details

## 2017-02-09 ENCOUNTER — Inpatient Hospital Stay: Payer: Medicare Other | Admitting: Physical Medicine & Rehabilitation

## 2018-01-16 ENCOUNTER — Encounter: Payer: Self-pay | Admitting: "Endocrinology

## 2018-03-30 IMAGING — DX DG CHEST 2V
2 series · 2 of 2 positions shown · non-contrast
Comparison: 12/17/2016

CLINICAL DATA: Shortness of breath

EXAM:
CHEST  2 VIEW

[chest ap]
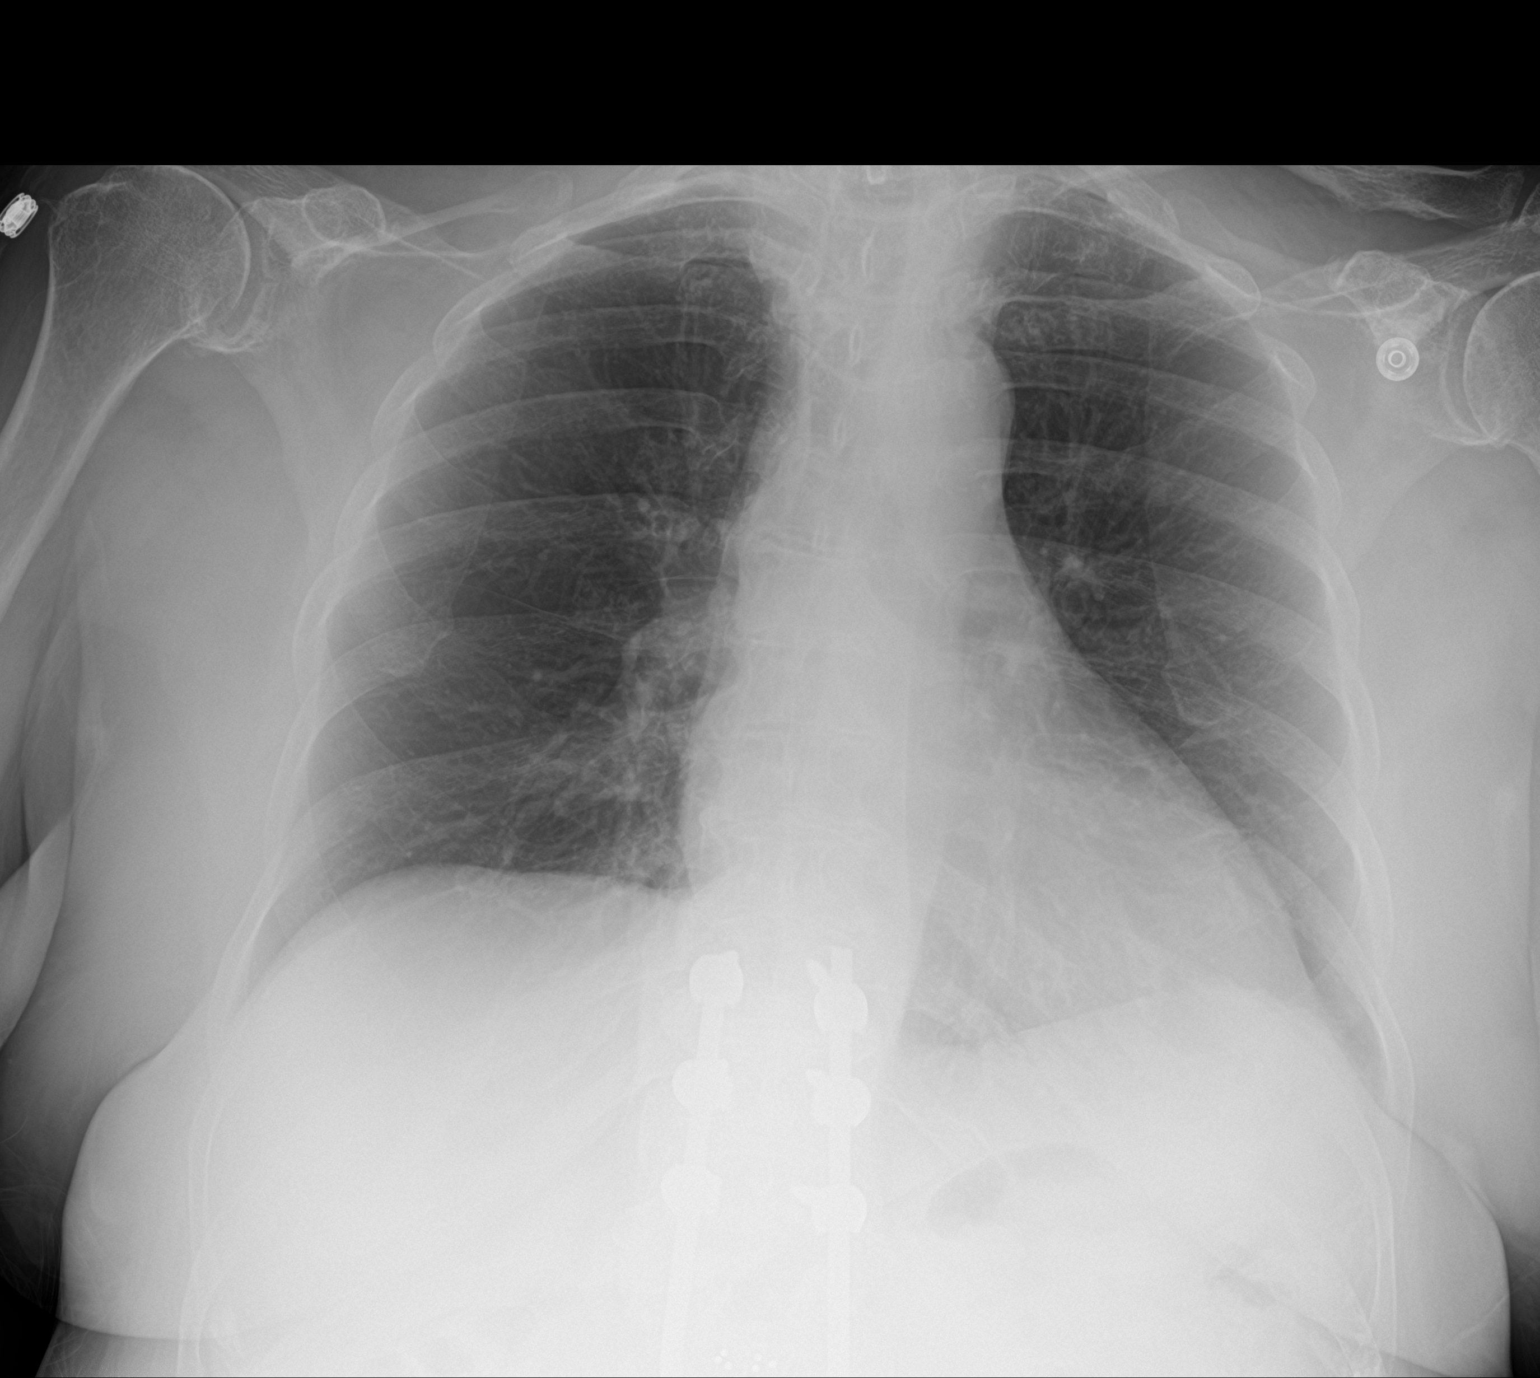

[chest lat]
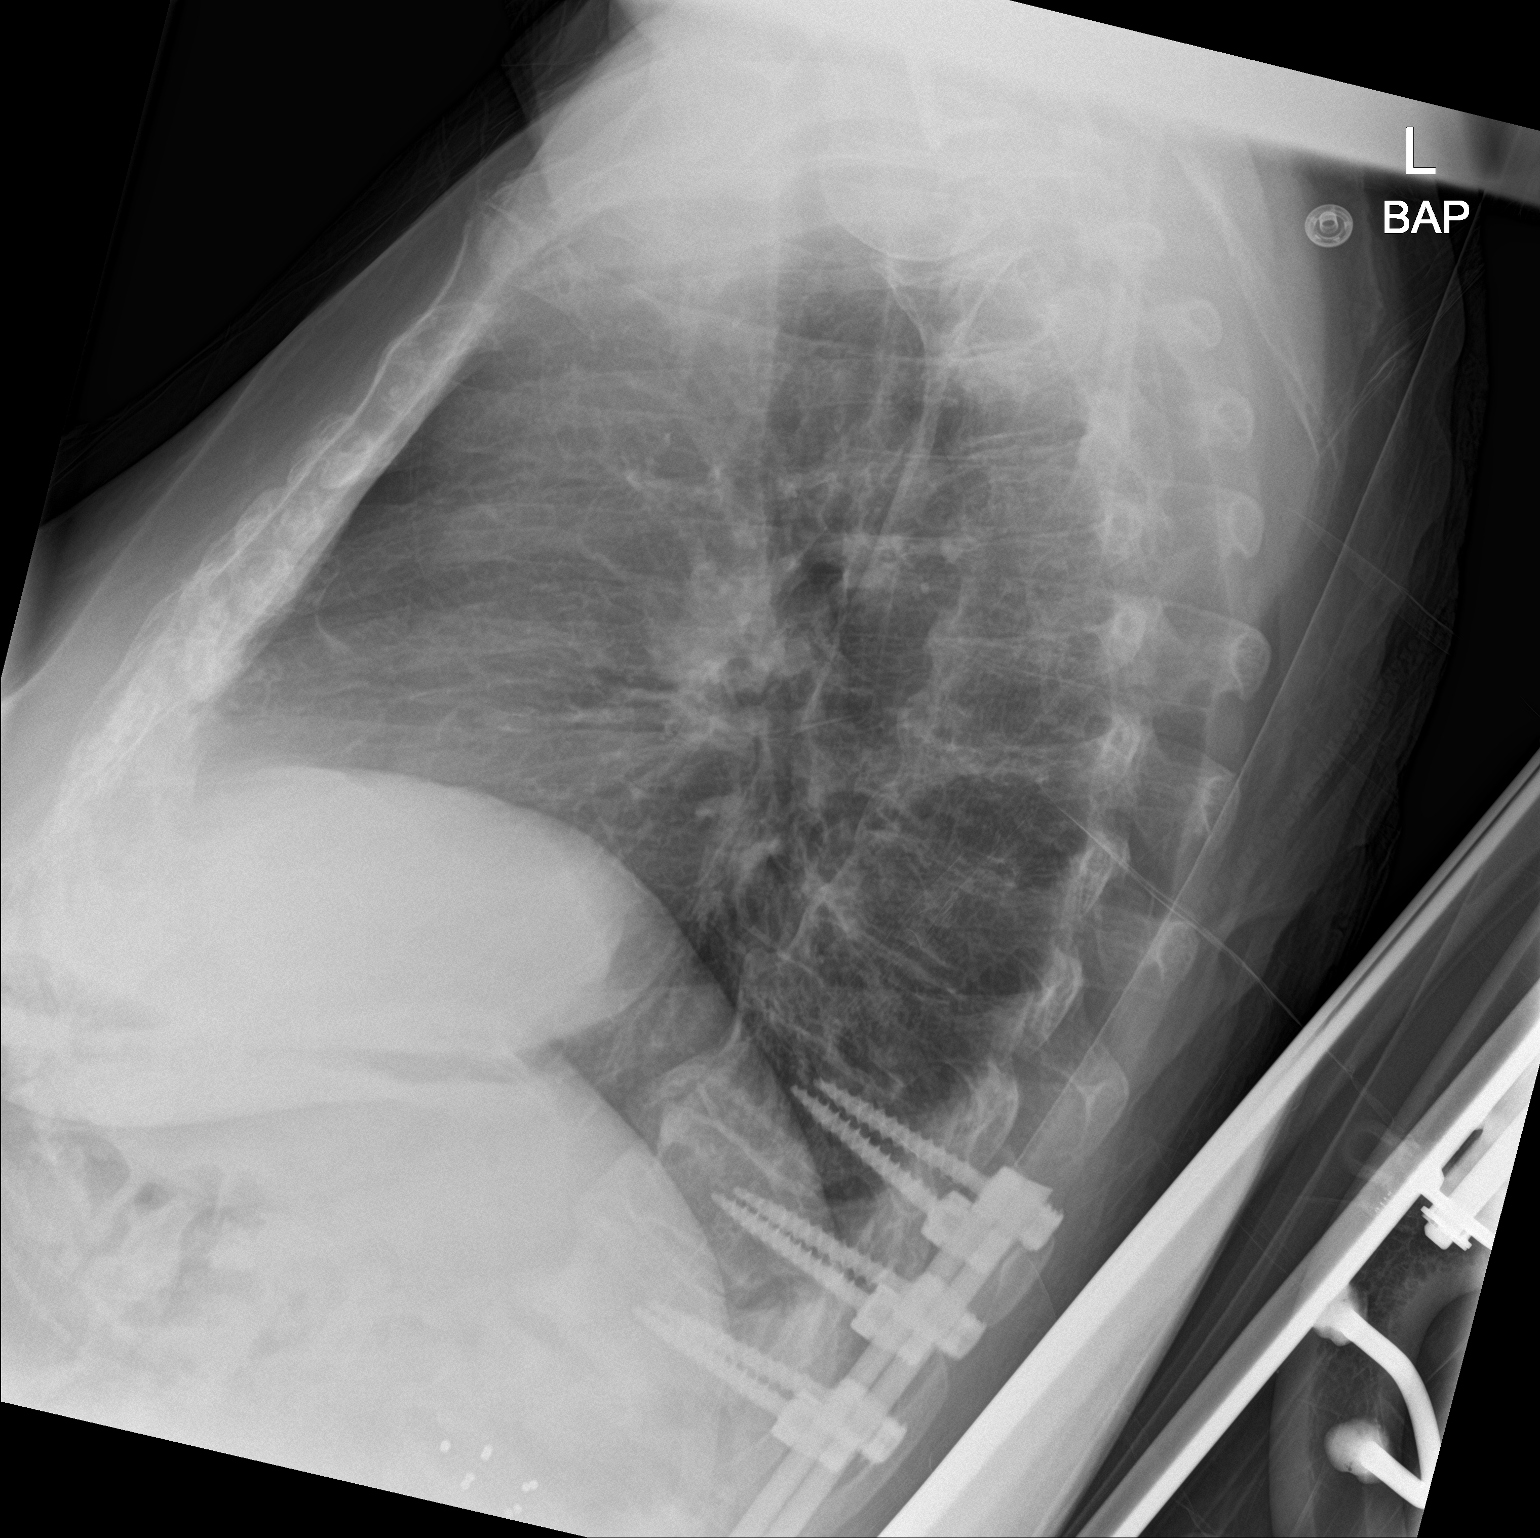

[2 of 2 positions shown; findings below may reference images not displayed]

FINDINGS: Lower cervical hardware. Partially visualized thoracolumbar spinal
rods and fixating screws. No acute consolidation or pleural
effusion. Mild cardiomegaly. No pneumothorax. Degenerative
osteophytes of the spine.
IMPRESSION: 1. No acute infiltrate or edema
2. Borderline to mild cardiomegaly

## 2018-04-07 ENCOUNTER — Encounter: Payer: Self-pay | Admitting: "Endocrinology

## 2018-07-03 ENCOUNTER — Encounter: Payer: Self-pay | Admitting: "Endocrinology

## 2018-07-03 ENCOUNTER — Ambulatory Visit (INDEPENDENT_AMBULATORY_CARE_PROVIDER_SITE_OTHER): Payer: Medicare Other | Admitting: "Endocrinology

## 2018-07-03 VITALS — BP 135/64 | HR 56 | Ht 62.0 in | Wt 188.0 lb

## 2018-07-03 DIAGNOSIS — E890 Postprocedural hypothyroidism: Secondary | ICD-10-CM

## 2018-07-03 NOTE — Progress Notes (Signed)
Endocrinology Consult Note                                            07/03/2018, 2:03 PM   Subjective:    Patient ID: Kim Mcgee, female    DOB: 02-09-1941, PCP Kennieth Rad, MD   Past Medical History:  Diagnosis Date  . Anxiety   . Arthritis    lumbar DDD  . Asthma   . Basal cell carcinoma of nose   . Chronic lower back pain   . Coronary artery disease   . Dysrhythmia   . GERD (gastroesophageal reflux disease)   . High cholesterol   . Hypertension   . Hypothyroidism   . Normal cardiac stress test   . PONV (postoperative nausea and vomiting)   . Shortness of breath    /w walking long distance   . Swallowing difficulty    being seen by ENT- had endoscopy- told that its probably related to past neck surgeries    Past Surgical History:  Procedure Laterality Date  . ANTERIOR CERVICAL DECOMP/DISCECTOMY FUSION  2010  . APPENDECTOMY    . BACK SURGERY    . BASAL CELL CARCINOMA EXCISION     "cut off nose"  . CARDIAC CATHETERIZATION  1970s; 1990s?; 2017  . CATARACT EXTRACTION W/ INTRAOCULAR LENS  IMPLANT, BILATERAL Bilateral   . CORONARY ANGIOPLASTY WITH STENT PLACEMENT  ~ 2017  . DECOMPRESSIVE LUMBAR LAMINECTOMY LEVEL 1 N/A 12/15/2016   Procedure: DECOMPRESSIVE  LAMINECTOMY Thoracic twelve to lumbar one;  Surgeon: Kary Kos, MD;  Location: [place];  Service: Neurosurgery;  Laterality: N/A;  . DILATION AND CURETTAGE OF UTERUS    . OOPHORECTOMY     "?side"  . POSTERIOR FUSION LUMBAR SPINE  ~ 2013; 2016  . POSTERIOR LUMBAR FUSION 4 LEVEL N/A 12/15/2016   Procedure: Posterior Fusion Thoracic- ten to Lumbar- two;  Surgeon: Kary Kos, MD;  Location: [facility];  Service: Neurosurgery;  Laterality: N/A;  . TONSILLECTOMY    . TOTAL THYROIDECTOMY  1990's  . TUBAL LIGATION     Social History   Socioeconomic History  . Marital status: Married    Spouse name: Not on file  . Number of children: Not on file  . Years of education: Not on file  . Highest education level: Not  on file  Occupational History  . Not on file  Social Needs  . Financial resource strain: Not on file  . Food insecurity:    Worry: Not on file    Inability: Not on file  . Transportation needs:    Medical: Not on file    Non-medical: Not on file  Tobacco Use  . Smoking status: Never Smoker  . Smokeless tobacco: Never Used  Substance and Sexual Activity  . Alcohol use: No  . Drug use: No  . Sexual activity: Not on file  Lifestyle  . Physical activity:    Days per week: Not on file    Minutes per session: Not on file  . Stress: Not on file  Relationships  . Social connections:    Talks on phone: Not on file    Gets together: Not on file    Attends religious service: Not on file    Active member of club or organization: Not on file    Attends meetings of clubs or organizations: Not  on file    Relationship status: Not on file  Other Topics Concern  . Not on file  Social History Narrative  . Not on file   Outpatient Encounter Medications as of 07/03/2018  Medication Sig  . aspirin EC 81 MG tablet Take 81 mg by mouth daily.  Marland Kitchen CRANBERRY-VITAMIN C PO Take by mouth 2 (two) times daily.  Marland Kitchen albuterol (PROVENTIL HFA;VENTOLIN HFA) 108 (90 BASE) MCG/ACT inhaler Inhale 2 puffs into the lungs every 6 (six) hours as needed for shortness of breath.   . DULoxetine (CYMBALTA) 20 MG capsule 20-40 mg daily.  Marland Kitchen levothyroxine (SYNTHROID, LEVOTHROID) 112 MCG tablet Take 112 mcg by mouth daily before breakfast.  . lisinopril (PRINIVIL,ZESTRIL) 10 MG tablet Take 1 tablet (10 mg total) by mouth daily.  . Omega-3 Fatty Acids (FISH OIL PO) Take 1 capsule by mouth daily.  . rosuvastatin (CRESTOR) 5 MG tablet Take 5 mg by mouth at bedtime.   . tamsulosin (FLOMAX) 0.4 MG CAPS capsule Take 1 capsule (0.4 mg total) by mouth 2 (two) times daily.  . [DISCONTINUED] acetaminophen (TYLENOL) 325 MG tablet Take 325-650 mg by mouth every 6 (six) hours as needed (for pain or headaches).   . [DISCONTINUED]  bethanechol (URECHOLINE) 25 MG tablet Take 1 tablet (25 mg total) by mouth 3 (three) times daily.  . [DISCONTINUED] cyclobenzaprine (FLEXERIL) 10 MG tablet Take 0.5 tablets (5 mg total) by mouth 3 (three) times daily as needed for muscle spasms.  . [DISCONTINUED] docusate sodium (COLACE) 100 MG capsule Take 1 capsule (100 mg total) by mouth 2 (two) times daily.  . [DISCONTINUED] fexofenadine (ALLEGRA) 180 MG tablet Take 180 mg by mouth daily as needed for allergies.  . [DISCONTINUED] Fluticasone-Salmeterol (ADVAIR) 500-50 MCG/DOSE AEPB Inhale 1 puff into the lungs 2 (two) times daily as needed (for seasonal "flares").   . [DISCONTINUED] gabapentin (NEURONTIN) 300 MG capsule Take 1 capsule (300 mg total) by mouth 3 (three) times daily.  . [DISCONTINUED] HYDROcodone-acetaminophen (NORCO/VICODIN) 5-325 MG tablet Take 1 tablet by mouth every 12 (twelve) hours as needed for severe pain.  . [DISCONTINUED] lidocaine (LIDODERM) 5 % Place 1 patch onto the skin daily. Apply at 6 am and remove at 6 pm dialy. Available over the counter.  . [DISCONTINUED] LORazepam (ATIVAN) 1 MG tablet Take 1 tablet (1 mg total) by mouth at bedtime.  . [DISCONTINUED] Menthol-Methyl Salicylate (MUSCLE RUB) 10-15 % CREA Apply 1 application topically as needed for muscle pain. To back 2-3 times a day for pain  . [DISCONTINUED] metoprolol succinate (TOPROL-XL) 50 MG 24 hr tablet Take 1 tablet (50 mg total) by mouth 2 (two) times daily.  . [DISCONTINUED] nitroGLYCERIN (NITROSTAT) 0.4 MG SL tablet Place 0.4 mg under the tongue every 5 (five) minutes x 3 doses as needed for chest pain.  . [DISCONTINUED] pantoprazole (PROTONIX) 40 MG tablet Take 40 mg by mouth daily before breakfast.  . [DISCONTINUED] polyethylene glycol (MIRALAX / GLYCOLAX) packet Take 17 g by mouth daily.  . [DISCONTINUED] sulfamethoxazole-trimethoprim (BACTRIM,SEPTRA) 400-80 MG tablet Take 1 tablet by mouth every 12 (twelve) hours.   No facility-administered  encounter medications on file as of 07/03/2018.    ALLERGIES: Allergies  Allergen Reactions  . Lyrica [Pregabalin]     Confused/agitated  . Macrodantin [Nitrofurantoin] Other (See Comments)    Chest pain  . Morphine And Related Nausea Only    CAUSED THE DRY HEAVES; CANNOT TAKE AGAIN!!  . Penicillins Rash and Other (See Comments)    Chest pain,  also Has patient had a PCN reaction causing immediate rash, facial/tongue/throat swelling, SOB or lightheadedness with hypotension: Yes Has patient had a PCN reaction causing severe rash involving mucus membranes or skin necrosis: No Has patient had a PCN reaction that required hospitalization: No Has patient had a PCN reaction occurring within the last 10 years: Yes If all of the above answers are "NO", then may proceed with Cephalosporin use.   . Tape Rash    No adhesive tape, but COBAN WRAP IS TOLERATED    VACCINATION STATUS:  There is no immunization history on file for this patient.  HPI Kim Mcgee is 78 y.o. female who presents today with a medical history as above. she is being seen in consultation for postsurgical hypothyroidism requested by Kennieth Rad, MD.   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  She was given thyroid hormone replacement with various doses over the years, currently levothyroxine 112 mcg p.o. every morning for 6 days out of 7 and no dose on Sundays.  -She has been dealing with fluctuating body weight.  She denies dysphagia, shortness of breath, nor voice change.  She has family history of thyroid dysfunction in her mother as well as daughter and niece.  She reports compliance to her medication taking it on empty stomach and separated by at least half hour from her other medications.  Review of Systems  Constitutional: + Recent progressive weight gain, + fatigue, no subjective hyperthermia, no subjective hypothermia Eyes: no blurry vision, no  xerophthalmia ENT: no sore throat, no nodules palpated in throat, no dysphagia/odynophagia, no hoarseness Cardiovascular: no Chest Pain, no Shortness of Breath, no palpitations, no leg swelling Respiratory: no cough, no shortness of breath Gastrointestinal: no Nausea/Vomiting/Diarhhea Musculoskeletal: no muscle/joint aches Skin: no rashes Neurological: no tremors, no numbness, no tingling, no dizziness Psychiatric: no depression, no anxiety  Objective:    BP 135/64   Pulse (!) 56   Ht 5\' 2"  (1.575 m)   Wt 188 lb (85.3 kg)   BMI 34.39 kg/m   Wt Readings from Last 3 Encounters:  07/03/18 188 lb (85.3 kg)  01/02/17 173 lb 14.4 oz (78.9 kg)  12/15/16 164 lb (74.4 kg)    Physical Exam  Constitutional:  + obese for height, not in acute distress, normal state of mind Eyes: PERRLA, EOMI, no exophthalmos ENT: moist mucous membranes, + barely visible old scar on her anterior lower neck , no gross cervical lymphadenopathy Cardiovascular: normal precordial activity, Regular Rate and Rhythm, no Murmur/Rubs/Gallops Respiratory:  adequate breathing efforts, no gross chest deformity, Clear to auscultation bilaterally Gastrointestinal: abdomen soft, Non -tender, No distension, Bowel Sounds present, no gross organomegaly Musculoskeletal: no gross deformities, strength intact in all four extremities Skin: moist, warm, no rashes Neurological: no tremor with outstretched hands, Deep tendon reflexes normal in bilateral lower extremities.  CMP ( most recent) CMP     Component Value Date/Time   NA 135 12/27/2016 0613   K 4.4 12/27/2016 0613   CL 102 12/27/2016 0613   CO2 27 12/27/2016 0613   GLUCOSE 122 (H) 12/27/2016 0613   BUN 17 12/27/2016 0613   CREATININE 0.64 12/27/2016 0613   CALCIUM 8.3 (L) 12/27/2016 0613   PROT 4.9 (L) 12/22/2016 0546   ALBUMIN 2.5 (L) 12/22/2016 0546   AST 36 12/22/2016 0546   ALT 85 (H) 12/22/2016 0546   ALKPHOS 35 (L) 12/22/2016 0546   BILITOT 1.0 12/22/2016  0546   GFRNONAA >  60 12/27/2016 0613   GFRAA >60 12/27/2016 7530     Diabetic Labs (most recent): Lab Results  Component Value Date   HGBA1C 5.8 (H) 12/22/2016    Labs on July 19, 2017: TSH 0.1 April 06, 2018 Free T4 1.31, TSH 0.12.   Assessment & Plan:   1. Postsurgical hypothyroidism  - Tanaja Ganger  is being seen at a kind request of Kennieth Rad, MD. - I have reviewed her available thyroid records and clinically evaluated the patient. - Based on these reviews, she has postsurgical hypothyroidism,  however,  there is not sufficient information to proceed with definitive treatment plan. -I approached her for new set of labs today for:  - TSH - T4, free - T3, free - US THYROID; Future - Thyroid peroxidase antibody - Thyroglobulin antibody  -In the meantime, I advised her to continue levothyroxine 112 mcg p.o. every morning.  - We discussed about the correct intake of her thyroid hormone, on empty stomach at fasting, with water, separated by at least 30 minutes from breakfast and other medications,  and separated by more than 4 hours from calcium, iron, multivitamins, acid reflux medications (PPIs). -Patient is made aware of the fact that thyroid hormone replacement is needed for life, dose to be adjusted by periodic monitoring of thyroid function tests.   Given her remote past history of thyroidectomy for reported history of multinodular goiter, and family history of thyroid malignancy, I offered her surveillance thyroid/neck ultrasound.  This study will be done in [place] center.  -she will return in 1 week to review her repeat labs.  - I did not initiate any new prescriptions today. - I advised her  to maintain close follow up with Kennieth Rad, MD for primary care needs.  - Time spent with the patient: 35 minutes, of which >50% was spent in obtaining information about her symptoms, reviewing her previous labs/studies,  evaluations, and treatments,  counseling her about her postsurgical hypothyroidism, and developing a plan to confirm the diagnosis and long term treatment based on the latest standards of care/guidelines.    Bary Richard participated in the discussions, expressed understanding, and voiced agreement with the above plans.  All questions were answered to her satisfaction. she is encouraged to contact clinic should she have any questions or concerns prior to her return visit.  Follow up plan: Return in about 1 week (around 07/10/2018) for Labs Today- Non-Fasting Ok, Thyroid / Neck Ultrasound.   Glade Lloyd, MD [facility] [address] [place], [place] [address] Phone: [phone]  Fax: [phone]     07/03/2018, 2:03 PM  This note was partially dictated with voice recognition software. Similar sounding words can be transcribed inadequately or may not  be corrected upon review.

## 2018-07-13 ENCOUNTER — Ambulatory Visit: Payer: Medicare Other | Admitting: "Endocrinology

## 2018-07-24 ENCOUNTER — Encounter: Payer: Self-pay | Admitting: "Endocrinology

## 2018-07-24 ENCOUNTER — Ambulatory Visit (INDEPENDENT_AMBULATORY_CARE_PROVIDER_SITE_OTHER): Payer: Medicare Other | Admitting: "Endocrinology

## 2018-07-24 VITALS — BP 138/80 | HR 59 | Temp 97.8°F | Ht 62.0 in | Wt 190.4 lb

## 2018-07-24 DIAGNOSIS — E890 Postprocedural hypothyroidism: Secondary | ICD-10-CM

## 2018-07-24 LAB — TSH: TSH: 0.12 mIU/L — ABNORMAL LOW (ref 0.40–4.50)

## 2018-07-24 LAB — T4, FREE: Free T4: 1.9 ng/dL — ABNORMAL HIGH (ref 0.8–1.8)

## 2018-07-24 NOTE — Progress Notes (Signed)
Endocrinology follow-up  Note                                            07/24/2018, 12:59 PM   Subjective:    Patient ID: Kim Mcgee, female    DOB: January 20, 1941, PCP Kennieth Rad, MD   Past Medical History:  Diagnosis Date  . Anxiety   . Arthritis    lumbar DDD  . Asthma   . Basal cell carcinoma of nose   . Chronic lower back pain   . Coronary artery disease   . Dysrhythmia   . GERD (gastroesophageal reflux disease)   . High cholesterol   . Hypertension   . Hypothyroidism   . Normal cardiac stress test   . PONV (postoperative nausea and vomiting)   . Shortness of breath    /w walking long distance   . Swallowing difficulty    being seen by ENT- had endoscopy- told that its probably related to past neck surgeries    Past Surgical History:  Procedure Laterality Date  . ANTERIOR CERVICAL DECOMP/DISCECTOMY FUSION  2010  . APPENDECTOMY    . BACK SURGERY    . BASAL CELL CARCINOMA EXCISION     "cut off nose"  . CARDIAC CATHETERIZATION  1970s; 1990s?; 2017  . CATARACT EXTRACTION W/ INTRAOCULAR LENS  IMPLANT, BILATERAL Bilateral   . CORONARY ANGIOPLASTY WITH STENT PLACEMENT  ~ 2017  . DECOMPRESSIVE LUMBAR LAMINECTOMY LEVEL 1 N/A 12/15/2016   Procedure: DECOMPRESSIVE  LAMINECTOMY Thoracic twelve to lumbar one;  Surgeon: Kary Kos, MD;  Location: [place];  Service: Neurosurgery;  Laterality: N/A;  . DILATION AND CURETTAGE OF UTERUS    . OOPHORECTOMY     "?side"  . POSTERIOR FUSION LUMBAR SPINE  ~ 2013; 2016  . POSTERIOR LUMBAR FUSION 4 LEVEL N/A 12/15/2016   Procedure: Posterior Fusion Thoracic- ten to Lumbar- two;  Surgeon: Kary Kos, MD;  Location: [facility];  Service: Neurosurgery;  Laterality: N/A;  . TONSILLECTOMY    . TOTAL THYROIDECTOMY  1990's  . TUBAL LIGATION     Social History   Socioeconomic History  . Marital status: Married    Spouse name: Not on file  . Number of children: Not on file  . Years of education: Not on file  . Highest education level:  Not on file  Occupational History  . Not on file  Social Needs  . Financial resource strain: Not on file  . Food insecurity:    Worry: Not on file    Inability: Not on file  . Transportation needs:    Medical: Not on file    Non-medical: Not on file  Tobacco Use  . Smoking status: Never Smoker  . Smokeless tobacco: Never Used  Substance and Sexual Activity  . Alcohol use: No  . Drug use: No  . Sexual activity: Not on file  Lifestyle  . Physical activity:    Days per week: Not on file    Minutes per session: Not on file  . Stress: Not on file  Relationships  . Social connections:    Talks on phone: Not on file    Gets together: Not on file    Attends religious service: Not on file    Active member of club or organization: Not on file    Attends meetings of clubs or organizations:  Not on file    Relationship status: Not on file  Other Topics Concern  . Not on file  Social History Narrative  . Not on file   Outpatient Encounter Medications as of 07/24/2018  Medication Sig  . albuterol (PROVENTIL HFA;VENTOLIN HFA) 108 (90 BASE) MCG/ACT inhaler Inhale 2 puffs into the lungs every 6 (six) hours as needed for shortness of breath.   Marland Kitchen aspirin EC 81 MG tablet Take 81 mg by mouth daily.  Marland Kitchen CRANBERRY-VITAMIN C PO Take by mouth 2 (two) times daily.  . DULoxetine (CYMBALTA) 20 MG capsule 20-40 mg daily.  Marland Kitchen levothyroxine (SYNTHROID, LEVOTHROID) 112 MCG tablet Take 112 mcg by mouth daily before breakfast.  . lisinopril (PRINIVIL,ZESTRIL) 10 MG tablet Take 1 tablet (10 mg total) by mouth daily.  . Omega-3 Fatty Acids (FISH OIL PO) Take 1 capsule by mouth daily.  . rosuvastatin (CRESTOR) 5 MG tablet Take 5 mg by mouth at bedtime.   . tamsulosin (FLOMAX) 0.4 MG CAPS capsule Take 1 capsule (0.4 mg total) by mouth 2 (two) times daily.   No facility-administered encounter medications on file as of 07/24/2018.    ALLERGIES: Allergies  Allergen Reactions  . Lyrica [Pregabalin]      Confused/agitated  . Macrodantin [Nitrofurantoin] Other (See Comments)    Chest pain  . Morphine And Related Nausea Only    CAUSED THE DRY HEAVES; CANNOT TAKE AGAIN!!  . Penicillins Rash and Other (See Comments)    Chest pain, also Has patient had a PCN reaction causing immediate rash, facial/tongue/throat swelling, SOB or lightheadedness with hypotension: Yes Has patient had a PCN reaction causing severe rash involving mucus membranes or skin necrosis: No Has patient had a PCN reaction that required hospitalization: No Has patient had a PCN reaction occurring within the last 10 years: Yes If all of the above answers are "NO", then may proceed with Cephalosporin use.   . Tape Rash    No adhesive tape, but COBAN WRAP IS TOLERATED    VACCINATION STATUS:  There is no immunization history on file for this patient.  HPI Kim Mcgee is 78 y.o. female who presents today with a medical history as above. she is returning with repeat thyroid/neck ultrasound, and labs after she was seen in consultation for postsurgical hypothyroidism requested by Kennieth Rad, MD.   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  She was given thyroid hormone replacement with various doses over the years, currently levothyroxine 112 mcg p.o. every morning.  -She has been dealing with fluctuating body weight.  She denies dysphagia, shortness of breath, nor voice change.  She has family history of thyroid dysfunction in her mother as well as daughter and niece.  She reports compliance to her medication taking it on empty stomach and separated by at least half hour from her other medications.  Review of Systems  Constitutional: + Recent progressive weight gain, + fatigue, no subjective hyperthermia, no subjective hypothermia Eyes: no blurry vision, no xerophthalmia ENT: no sore throat, no nodules palpated in throat, no dysphagia/odynophagia, no  hoarseness Cardiovascular: no Chest Pain, no Shortness of Breath, no palpitations, no leg swelling Respiratory: no cough, no shortness of breath Gastrointestinal: no Nausea/Vomiting/Diarhhea Musculoskeletal: no muscle/joint aches Skin: no rashes Neurological: no tremors, no numbness, no tingling, no dizziness Psychiatric: no depression, no anxiety  Objective:    BP 138/80 (BP Location: Right Arm, Patient Position: Sitting, Cuff Size: Normal)   Pulse (!) 59  Temp 97.8 F (36.6 C) (Oral)   Ht 5\' 2"  (1.575 m)   Wt 190 lb 6.4 oz (86.4 kg)   SpO2 95%   BMI 34.82 kg/m   Wt Readings from Last 3 Encounters:  07/24/18 190 lb 6.4 oz (86.4 kg)  07/03/18 188 lb (85.3 kg)  01/02/17 173 lb 14.4 oz (78.9 kg)    Physical Exam  Constitutional:  + obese for height, not in acute distress, normal state of mind Eyes: PERRLA, EOMI, no exophthalmos ENT: moist mucous membranes, + barely visible old scar on her anterior lower neck , no gross cervical lymphadenopathy  Musculoskeletal: no gross deformities, strength intact in all four extremities Skin: moist, warm, no rashes Neurological: no tremor with outstretched hands, Deep tendon reflexes normal in bilateral lower extremities.  CMP ( most recent) CMP     Component Value Date/Time   NA 135 12/27/2016 0613   K 4.4 12/27/2016 0613   CL 102 12/27/2016 0613   CO2 27 12/27/2016 0613   GLUCOSE 122 (H) 12/27/2016 0613   BUN 17 12/27/2016 0613   CREATININE 0.64 12/27/2016 0613   CALCIUM 8.3 (L) 12/27/2016 0613   PROT 4.9 (L) 12/22/2016 0546   ALBUMIN 2.5 (L) 12/22/2016 0546   AST 36 12/22/2016 0546   ALT 85 (H) 12/22/2016 0546   ALKPHOS 35 (L) 12/22/2016 0546   BILITOT 1.0 12/22/2016 0546   GFRNONAA >60 12/27/2016 0613   GFRAA >60 12/27/2016 6144     Diabetic Labs (most recent): Lab Results  Component Value Date   HGBA1C 5.8 (H) 12/22/2016     July 03, 2018 labs: Thyroid gland antibodies less than 1.0, TPO antibodies 10  Labs  on July 19, 2017: TSH 0.1 April 06, 2018 Free T4 1.31, TSH 0.12.   Assessment & Plan:   1. Postsurgical hypothyroidism -Her ultrasound reveals no thyroid tissue or abnormal soft tissue mass in the neck.  No cervical lymphadenopathy.  She will not require further intervention.  Her labs did not include TSH and free T4 as ordered.  She will be sent to lab today and will be contacted if adjustment needs to be made on the dose of her thyroid hormone. -In the meantime, she is advised to continue levothyroxine 112 mcg p.o. nightly.   - We discussed about the correct intake of her thyroid hormone, on empty stomach at fasting, with water, separated by at least 30 minutes from breakfast and other medications,  and separated by more than 4 hours from calcium, iron, multivitamins, acid reflux medications (PPIs). -Patient is made aware of the fact that thyroid hormone replacement is needed for life, dose to be adjusted by periodic monitoring of thyroid function tests.  She will return in 3 months with repeat TSH/free T4.  - I did not initiate any new prescriptions today. - I advised her  to maintain close follow up with Kennieth Rad, MD for primary care needs.    Follow up plan: Return in about 3 months (around 10/22/2018) for Follow up with Pre-visit Labs.   Glade Lloyd, MD [facility] [address] [place], [place] [address] Phone: [phone]  Fax: [phone]     07/24/2018, 12:59 PM  This note was partially dictated with voice recognition software. Similar sounding words can be transcribed inadequately or may not  be corrected upon review.

## 2018-07-25 ENCOUNTER — Other Ambulatory Visit: Payer: Self-pay | Admitting: "Endocrinology

## 2018-07-25 MED ORDER — LEVOTHYROXINE SODIUM 100 MCG PO TABS: 100.0000 ug | ORAL_TABLET | Freq: Every day | ORAL | 2 refills | Status: DC

## 2018-07-26 ENCOUNTER — Telehealth: Payer: Self-pay

## 2018-07-26 NOTE — Telephone Encounter (Signed)
Pt.notified

## 2018-07-26 NOTE — Telephone Encounter (Signed)
-----   Message from Cassandria Anger, MD sent at 07/25/2018  9:14 AM EST ----- Kim Mcgee, Would you let this patient know ? that I am sending a new prescription for a lower dose of levothyroxine for her based on her labs .

## 2018-10-24 ENCOUNTER — Ambulatory Visit: Payer: Medicare Other | Admitting: "Endocrinology

## 2018-11-22 ENCOUNTER — Ambulatory Visit: Payer: Medicare Other | Admitting: "Endocrinology

## 2018-12-01 ENCOUNTER — Other Ambulatory Visit: Payer: Self-pay | Admitting: "Endocrinology

## 2018-12-13 LAB — HEPATIC FUNCTION PANEL
ALT: 23 (ref 7–35)
AST: 19 (ref 13–35)
Alkaline Phosphatase: 60 (ref 25–125)
Bilirubin, Total: 0.9

## 2018-12-13 LAB — VITAMIN D 25 HYDROXY (VIT D DEFICIENCY, FRACTURES): Vit D, 25-Hydroxy: 39

## 2018-12-13 LAB — LIPID PANEL
Cholesterol: 150 (ref 0–200)
HDL: 66 (ref 35–70)
LDL Cholesterol: 70
Triglycerides: 68 (ref 40–160)

## 2018-12-13 LAB — BASIC METABOLIC PANEL
BUN: 17 (ref 4–21)
Creatinine: 0.7 (ref 0.5–1.1)
Glucose: 106
Potassium: 4.3 (ref 3.4–5.3)
Sodium: 144 (ref 137–147)

## 2018-12-13 LAB — TSH: TSH: 0.11 — AB (ref 0.41–5.90)

## 2018-12-13 LAB — CBC AND DIFFERENTIAL
Hemoglobin: 13.3 (ref 12.0–16.0)
Platelets: 259 (ref 150–399)
WBC: 11.2

## 2018-12-26 ENCOUNTER — Ambulatory Visit: Payer: Medicare Other | Admitting: "Endocrinology

## 2019-01-19 LAB — THYROGLOBULIN LEVEL: Thyroglobulin: 1.5 ng/mL — ABNORMAL LOW

## 2019-01-19 LAB — T4, FREE: Free T4: 1.3 ng/dL (ref 0.8–1.8)

## 2019-01-19 LAB — TSH: TSH: 1.07 mIU/L (ref 0.40–4.50)

## 2019-01-19 LAB — THYROGLOBULIN ANTIBODY: Thyroglobulin Ab: <1 IU/mL (ref <=1)

## 2019-01-25 ENCOUNTER — Encounter: Payer: Self-pay | Admitting: "Endocrinology

## 2019-01-25 ENCOUNTER — Ambulatory Visit (INDEPENDENT_AMBULATORY_CARE_PROVIDER_SITE_OTHER): Payer: Medicare Other | Admitting: "Endocrinology

## 2019-01-25 ENCOUNTER — Other Ambulatory Visit: Payer: Self-pay

## 2019-01-25 DIAGNOSIS — E890 Postprocedural hypothyroidism: Secondary | ICD-10-CM | POA: Diagnosis not present

## 2019-01-25 MED ORDER — LEVOTHYROXINE SODIUM 100 MCG PO TABS: 100.0000 ug | ORAL_TABLET | Freq: Every day | ORAL | 1 refills | Status: AC

## 2019-01-25 NOTE — Progress Notes (Signed)
01/25/2019, 4:21 PM                                Endocrinology Telehealth Visit Follow up Note -During COVID -19 Pandemic  I connected with Kim Mcgee on 01/25/2019   by telephone and verified that I am speaking with the correct person using two identifiers. Kim Mcgee, 09-08-1940. she has verbally consented to this visit. All issues noted in this document were discussed and addressed. The format was not optimal for physical exam.   Subjective:    Patient ID: Kim Mcgee, female    DOB: Oct 13, 1940, PCP Kennieth Rad, MD   Past Medical History:  Diagnosis Date  . Anxiety   . Arthritis    lumbar DDD  . Asthma   . Basal cell carcinoma of nose   . Chronic lower back pain   . Coronary artery disease   . Dysrhythmia   . GERD (gastroesophageal reflux disease)   . High cholesterol   . Hypertension   . Hypothyroidism   . Normal cardiac stress test   . PONV (postoperative nausea and vomiting)   . Shortness of breath    /w walking long distance   . Swallowing difficulty    being seen by ENT- had endoscopy- told that its probably related to past neck surgeries    Past Surgical History:  Procedure Laterality Date  . ANTERIOR CERVICAL DECOMP/DISCECTOMY FUSION  2010  . APPENDECTOMY    . BACK SURGERY    . BASAL CELL CARCINOMA EXCISION     "cut off nose"  . CARDIAC CATHETERIZATION  1970s; 1990s?; 2017  . CATARACT EXTRACTION W/ INTRAOCULAR LENS  IMPLANT, BILATERAL Bilateral   . CORONARY ANGIOPLASTY WITH STENT PLACEMENT  ~ 2017  . DECOMPRESSIVE LUMBAR LAMINECTOMY LEVEL 1 N/A 12/15/2016   Procedure: DECOMPRESSIVE  LAMINECTOMY Thoracic twelve to lumbar one;  Surgeon: Kary Kos, MD;  Location: [place];  Service: Neurosurgery;  Laterality: N/A;  . DILATION AND CURETTAGE OF UTERUS    . OOPHORECTOMY     "?side"  . POSTERIOR FUSION LUMBAR SPINE  ~ 2013; 2016  . POSTERIOR LUMBAR FUSION 4 LEVEL N/A 12/15/2016   Procedure: Posterior Fusion  Thoracic- ten to Lumbar- two;  Surgeon: Kary Kos, MD;  Location: [facility];  Service: Neurosurgery;  Laterality: N/A;  . TONSILLECTOMY    . TOTAL THYROIDECTOMY  1990's  . TUBAL LIGATION     Social History   Socioeconomic History  . Marital status: Married    Spouse name: Not on file  . Number of children: Not on file  . Years of education: Not on file  . Highest education level: Not on file  Occupational History  . Not on file  Social Needs  . Financial resource strain: Not on file  . Food insecurity    Worry: Not on file    Inability: Not on file  . Transportation needs    Medical: Not on file    Non-medical: Not on file  Tobacco Use  . Smoking status: Never Smoker  . Smokeless tobacco: Never Used  Substance and Sexual Activity  . Alcohol use: No  . Drug use: No  . Sexual  activity: Not on file  Lifestyle  . Physical activity    Days per week: Not on file    Minutes per session: Not on file  . Stress: Not on file  Relationships  . Social Herbalist on phone: Not on file    Gets together: Not on file    Attends religious service: Not on file    Active member of club or organization: Not on file    Attends meetings of clubs or organizations: Not on file    Relationship status: Not on file  Other Topics Concern  . Not on file  Social History Narrative  . Not on file   Outpatient Encounter Medications as of 01/25/2019  Medication Sig  . albuterol (PROVENTIL HFA;VENTOLIN HFA) 108 (90 BASE) MCG/ACT inhaler Inhale 2 puffs into the lungs every 6 (six) hours as needed for shortness of breath.   Marland Kitchen aspirin EC 81 MG tablet Take 81 mg by mouth daily.  Marland Kitchen CRANBERRY-VITAMIN C PO Take by mouth 2 (two) times daily.  . DULoxetine (CYMBALTA) 20 MG capsule 20-40 mg daily.  Marland Kitchen levothyroxine (SYNTHROID) 100 MCG tablet Take 1 tablet (100 mcg total) by mouth daily before breakfast.  . lisinopril (PRINIVIL,ZESTRIL) 10 MG tablet Take 1 tablet (10 mg total) by mouth daily.  .  Omega-3 Fatty Acids (FISH OIL PO) Take 1 capsule by mouth daily.  . rosuvastatin (CRESTOR) 5 MG tablet Take 5 mg by mouth at bedtime.   . tamsulosin (FLOMAX) 0.4 MG CAPS capsule Take 1 capsule (0.4 mg total) by mouth 2 (two) times daily.  . [DISCONTINUED] levothyroxine (SYNTHROID) 100 MCG tablet TAKE 1 TABLET (100 MCG TOTAL) BY MOUTH DAILY BEFORE BREAKFAST.   No facility-administered encounter medications on file as of 01/25/2019.    ALLERGIES: Allergies  Allergen Reactions  . Lyrica [Pregabalin]     Confused/agitated  . Macrodantin [Nitrofurantoin] Other (See Comments)    Chest pain  . Morphine And Related Nausea Only    CAUSED THE DRY HEAVES; CANNOT TAKE AGAIN!!  . Penicillins Rash and Other (See Comments)    Chest pain, also Has patient had a PCN reaction causing immediate rash, facial/tongue/throat swelling, SOB or lightheadedness with hypotension: Yes Has patient had a PCN reaction causing severe rash involving mucus membranes or skin necrosis: No Has patient had a PCN reaction that required hospitalization: No Has patient had a PCN reaction occurring within the last 10 years: Yes If all of the above answers are "NO", then may proceed with Cephalosporin use.   . Tape Rash    No adhesive tape, but COBAN WRAP IS TOLERATED    VACCINATION STATUS:  There is no immunization history on file for this patient.  HPI Kim Mcgee is 78 y.o. female who is being engaged in telehealth by telephone after she was seen in consultation for postsurgical hypothyroidism. PMD:  Kennieth Rad, MD.   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  She was given thyroid hormone replacement with various doses over the years, currently levothyroxine 100 mcg p.o. every morning.  She has no new complaints today.  She is consistent and compliant with her medications.  -She has been dealing with fluctuating body weight.  She denies dysphagia,  shortness of breath, nor voice change.  Her recent thyroid/neck ultrasound was consistent with absent thyroid.    She has family history of thyroid dysfunction in her mother as well as daughter and niece.  Review of Systems  Limited as above.  Objective:    There were no vitals taken for this visit.  Wt Readings from Last 3 Encounters:  07/24/18 190 lb 6.4 oz (86.4 kg)  07/03/18 188 lb (85.3 kg)  01/02/17 173 lb 14.4 oz (78.9 kg)   CMP ( most recent) CMP     Component Value Date/Time   NA 135 12/27/2016 0613   K 4.4 12/27/2016 0613   CL 102 12/27/2016 0613   CO2 27 12/27/2016 0613   GLUCOSE 122 (H) 12/27/2016 0613   BUN 17 12/27/2016 0613   CREATININE 0.64 12/27/2016 0613   CALCIUM 8.3 (L) 12/27/2016 0613   PROT 4.9 (L) 12/22/2016 0546   ALBUMIN 2.5 (L) 12/22/2016 0546   AST 36 12/22/2016 0546   ALT 85 (H) 12/22/2016 0546   ALKPHOS 35 (L) 12/22/2016 0546   BILITOT 1.0 12/22/2016 0546   GFRNONAA >60 12/27/2016 0613   GFRAA >60 12/27/2016 [id number]     Diabetic Labs (most recent): Lab Results  Component Value Date   HGBA1C 5.8 (H) 12/22/2016    Recent Results (from the past 2160 hour(s))  Thyroglobulin antibody     Status: None   Collection Time: 01/18/19  3:10 PM  Result Value Ref Range   Thyroglobulin Ab <1 < or = 1 IU/mL  TSH     Status: None   Collection Time: 01/18/19  3:10 PM  Result Value Ref Range   TSH 1.07 0.40 - 4.50 mIU/L  T4, free     Status: None   Collection Time: 01/18/19  3:10 PM  Result Value Ref Range   Free T4 1.3 0.8 - 1.8 ng/dL  Thyroglobulin Level     Status: Abnormal   Collection Time: 01/18/19  3:10 PM  Result Value Ref Range   Thyroglobulin 1.5 (L) ng/mL    Comment:       Reference Range:       Intact Thyroid   2.8-40.9       Athyrotic        <0.1 .       Note: Abnormal flagging is based       on the reference interval for        patients with intact thyroid. . . This test was performed using the Beckman Coulter   chemiluminescent method. Values obtained from different assay methods cannot be used interchangeably. Thyroglobulin levels, regardless of value, should not be interpreted as absolute evidence of the presence or absence of disease. .    Comment      Comment: . Thyroglobulin antibodies (TGAB) interfere with thyroglobulin (TG) assays; therefore, TGAB assay should always be performed in conjunction with a TG assay. . . For additional information, please refer to  http://education.quest[employer].com/faq/FAQ202  (This link is being provided for informational/ educational purposes only.) .     July 03, 2018 labs: Thyroid gland antibodies less than 1.0, TPO antibodies 10  Labs on July 19, 2017: TSH 0.1 April 06, 2018 Free T4 1.31, TSH 0.12.  Thyroid/neck ultrasound: Absent thyroid, lymph nodes.   Assessment & Plan:   1. Postsurgical hypothyroidism -Her ultrasound reveals no thyroid tissue or abnormal soft tissue mass in the neck.  No cervical lymphadenopathy.  She will not require further intervention.    -Her previsit labs are consistent with appropriate replacement, she is advised to continue levothyroxine 100 mcg p.o. every morning.    - We discussed about the correct intake of her thyroid hormone, on empty stomach at  fasting, with water, separated by at least 30 minutes from breakfast and other medications,  and separated by more than 4 hours from calcium, iron, multivitamins, acid reflux medications (PPIs). -Patient is made aware of the fact that thyroid hormone replacement is needed for life, dose to be adjusted by periodic monitoring of thyroid function tests.  - I advised her  to maintain close follow up with Kennieth Rad, MD for primary care needs.   Time for this visit: 15 minutes. Kim Mcgee  participated in the discussions, expressed understanding, and voiced agreement with the above plans.  All questions were answered to her satisfaction. she is  encouraged to contact clinic should she have any questions or concerns prior to her return visit.   Follow up plan: Return in about 4 months (around 05/27/2019) for Follow up with Pre-visit Labs.   Glade Lloyd, MD [facility] [address] [place], [place] [address] Phone: [phone]  Fax: [phone]     01/25/2019, 4:21 PM  This note was partially dictated with voice recognition software. Similar sounding words can be transcribed inadequately or may not  be corrected upon review.

## 2019-01-25 NOTE — Progress Notes (Signed)
Pt did not have thyroid u/s done. I advised her we would schedule this for her.

## 2019-02-01 ENCOUNTER — Other Ambulatory Visit: Payer: Self-pay

## 2019-02-01 ENCOUNTER — Encounter (INDEPENDENT_AMBULATORY_CARE_PROVIDER_SITE_OTHER): Payer: Self-pay | Admitting: Bariatrics

## 2019-02-01 ENCOUNTER — Ambulatory Visit (INDEPENDENT_AMBULATORY_CARE_PROVIDER_SITE_OTHER): Payer: Medicare Other | Admitting: Bariatrics

## 2019-02-01 VITALS — BP 127/68 | HR 50 | Temp 97.6°F | Ht 61.0 in | Wt 193.0 lb

## 2019-02-01 DIAGNOSIS — I10 Essential (primary) hypertension: Secondary | ICD-10-CM

## 2019-02-01 DIAGNOSIS — R0602 Shortness of breath: Secondary | ICD-10-CM | POA: Diagnosis not present

## 2019-02-01 DIAGNOSIS — R7303 Prediabetes: Secondary | ICD-10-CM

## 2019-02-01 DIAGNOSIS — E890 Postprocedural hypothyroidism: Secondary | ICD-10-CM

## 2019-02-01 DIAGNOSIS — Z0289 Encounter for other administrative examinations: Secondary | ICD-10-CM

## 2019-02-01 DIAGNOSIS — E7849 Other hyperlipidemia: Secondary | ICD-10-CM

## 2019-02-01 DIAGNOSIS — R5383 Other fatigue: Secondary | ICD-10-CM | POA: Diagnosis not present

## 2019-02-01 DIAGNOSIS — Z1331 Encounter for screening for depression: Secondary | ICD-10-CM

## 2019-02-01 DIAGNOSIS — Z6836 Body mass index (BMI) 36.0-36.9, adult: Secondary | ICD-10-CM | POA: Diagnosis not present

## 2019-02-01 NOTE — Progress Notes (Signed)
Office: [phone]  /  Fax: [phone]   Dear Kim Mcgee,   Thank you for referring Kim Mcgee to our clinic. The following note includes my evaluation and treatment recommendations.  HPI:   Chief Complaint: OBESITY    Kim Mcgee has been referred by Dr. Kennieth Mcgee for consultation regarding her obesity and obesity related comorbidities.    Kim Mcgee (MR# [id number]) is a 78 y.o. female who presents on 02/01/2019 for obesity evaluation and treatment. Current BMI is Body mass index is 36.47 kg/m.Kim Mcgee has been struggling with her weight for many years and has been unsuccessful in either losing weight, maintaining weight loss, or reaching her healthy weight goal.     Kim Mcgee attended our information session and states she is currently in the action stage of change and ready to dedicate time achieving and maintaining a healthier weight. Kim Mcgee is interested in becoming our patient and working on intensive lifestyle modifications including (but not limited to) diet, exercise and weight loss.      Kim Mcgee occasionally likes to cook, but not always. She does crave sweets. She dislikes fish, sometimes snacks at night, and skips lunch sometimes    Kim Mcgee states her family eats meals together she thinks her family will eat healthier with  her her desired weight loss is 43 lbs she started gaining weight at age 25 her heaviest weight ever was 198 lbs. she snacks sometimes in the evenings she sometimes makes poor food choices she struggles with emotional eating    Kim Mcgee feels her energy is lower than it should be. This has worsened with weight gain and has worsened recently. Kim Mcgee admits to daytime somnolence and admits to half and half waking refreshed and waking up still tired. Patient is at risk for obstructive sleep apnea. Patent has a history of symptoms of daytime Kim. Patient generally gets 7 hours of sleep per night, and states they generally have restful sleep. She  is not sure if snoring is present. Apneic episodes are not present. Epworth Sleepiness Score is 7.  Dyspnea on exertion Kim Mcgee notes increasing shortness of breath with exercising and seems to be worsening over time with weight gain. She notes getting out of breath sooner with certain activities than she used to. This has gotten worse recently. Kim Mcgee denies orthopnea.  Hypertension Kim Mcgee is a 78 y.o. female with hypertension and is taking lisinopril and metoprolol. Kim Mcgee denies chest pain or shortness of breath on exertion. She is working weight loss to help control her blood pressure with the goal of decreasing her risk of heart attack and stroke. Kim Mcgee does have a history of CAD and a history of cardiac stent in 2018.  Pre-Diabetes Kim Mcgee has a diagnosis of prediabetes based on her elevated Hgb A1c and was informed this puts her at greater risk of developing diabetes. She has steroid-induced hyperglycemia for back pain and is currently on no medication.She denies nausea or hypoglycemia.  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 She is on levothyroxine. She denies hot or cold intolerance or palpitations, but does admit to ongoing Kim. Thyroid panel essentially normal.  Hyperlipidemia Kim Mcgee has hyperlipidemia and has been trying to improve her cholesterol levels with intensive lifestyle modification including a low saturated fat diet, exercise and weight loss. She is taking Omega-3 fatty acid and Crestor. She denies any chest pain, claudication or myalgias.  Depression Screen Kim Mcgee Food and Mood (modified PHQ-9) score was 15. Depression screen Kim Mcgee 2/9 02/01/2019  Decreased Interest 2  Down, Depressed, Hopeless 2  PHQ - 2 Score 4  Altered sleeping 1  Tired, decreased energy 3  Change in appetite 1  Feeling bad or failure about yourself  2  Trouble concentrating 2  Moving slowly or fidgety/restless 2  Suicidal thoughts 0  PHQ-9 Score 15    Difficult doing work/chores Not difficult at all    ASSESSMENT AND PLAN:  Other Kim - Plan: EKG 12-Lead, VITAMIN D 25 Hydroxy (Vit-D Deficiency, Fractures)  SOB (shortness of breath) on exertion - Plan: VITAMIN D 25 Hydroxy (Vit-D Deficiency, Fractures)  Essential hypertension - Plan: VITAMIN D 25 Hydroxy (Vit-D Deficiency, Fractures)  Prediabetes - Plan: Insulin, random, VITAMIN D 25 Hydroxy (Vit-D Deficiency, Fractures)  Postsurgical hypothyroidism - Plan: VITAMIN D 25 Hydroxy (Vit-D Deficiency, Fractures)  Other hyperlipidemia - Plan: VITAMIN D 25 Hydroxy (Vit-D Deficiency, Fractures)  Depression screening  Class 2 severe obesity with serious comorbidity and body mass index (BMI) of 36.0 to 36.9 in adult, unspecified obesity type (HCC)  PLAN:  Kim Mcgee was informed that her Kim may be related to obesity, depression or many other causes. Labs will be ordered, and in the meanwhile Kim Mcgee has agreed to work on diet, exercise and weight loss to help with Kim. Proper sleep hygiene was discussed including the need for 7-8 hours of quality sleep each night. A sleep study was not ordered based on symptoms and Epworth score.  Dyspnea on exertion Kim Mcgee shortness of breath appears to be obesity related and exercise induced. She has agreed to work on weight loss and gradually increase exercise to treat her exercise induced shortness of breath. If Kim Mcgee follows our instructions and loses weight without improvement of her shortness of breath, we will plan to refer to pulmonology. We will monitor this condition regularly. Kim Mcgee agrees to this plan.  Hypertension We discussed sodium restriction, working on healthy weight loss, and a regular exercise program as the means to achieve improved blood pressure control. Kim Mcgee agreed with this plan and agreed to follow up as directed. We will continue to monitor her blood pressure as well as her progress with the above lifestyle  modifications. She will continue her medications as prescribed and will watch for signs of hypotension as she continues her lifestyle modifications. Kim Mcgee states she visits Cardiology every 6 months.  Pre-Diabetes Siiri will continue to work on weight loss, exercise, and decreasing simple carbohydrates in her diet to help decrease the risk of diabetes. We dicussed metformin including benefits and risks. She was informed that eating too many simple carbohydrates or too many calories at one sitting increases the likelihood of GI side effects. Contrina will increase protein and decrease carbohydrates. She will follow-up with [place] as directed.  Postsurgical Hypothyroidism Eufelia will continue levothyroxine and follow-up with [place] as directed.  Hyperlipidemia Sloka was informed of the American Heart Association Guidelines emphasizing intensive lifestyle modifications as the first line treatment for hyperlipidemia. We discussed many lifestyle modifications today in depth, and Deshanta will continue to work on decreasing saturated fats such as fatty red meat, butter and many fried foods. She will continue Crestor, increase vegetables and lean protein in her diet, and continue to work on exercise and weight loss efforts.  Depression Screen Aleya had a strongly positive depression screening. Depression is commonly associated with obesity and often results in emotional eating behaviors. We will monitor this closely and work on CBT to help improve the non-hunger eating patterns. Referral to Psychology may be required if no improvement is seen  as she continues in our clinic.  Obesity Kim Mcgee is currently in the action stage of change and her goal is to continue with weight loss efforts. I recommend Kim Mcgee begin the structured treatment plan as follows:  She has agreed to follow the Category 1 plan. Kim Mcgee will work on meal planning, increasing BMR with exercise and increasing protein. She was advised to not skip any  meals.  Kim Mcgee has been instructed to use the exercise bike, weights and resistance bands for weight loss and overall health benefits. We discussed the following Behavioral Modification Strategies today: increasing lean protein intake, decreasing simple carbohydrates, increasing vegetables, increase H20 intake, decrease eating out, no skipping meals, work on meal planning and easy cooking plans, and keeping healthy foods in the home.   She was informed of the importance of frequent follow-up visits to maximize her success with intensive lifestyle modifications for her multiple health conditions. She was informed we would discuss her lab results at her next visit unless there is a critical issue that needs to be addressed sooner. Kim Mcgee agreed to keep her next visit at the agreed upon time to discuss these results.  ALLERGIES: Allergies  Allergen Reactions   Lyrica [Pregabalin]     Confused/agitated   Macrodantin [Nitrofurantoin] Other (See Comments)    Chest pain   Morphine And Related Nausea Only    CAUSED THE DRY HEAVES; CANNOT TAKE AGAIN!!   Penicillins Rash and Other (See Comments)    Chest pain, also Has patient had a PCN reaction causing immediate rash, facial/tongue/throat swelling, SOB or lightheadedness with hypotension: Yes Has patient had a PCN reaction causing severe rash involving mucus membranes or skin necrosis: No Has patient had a PCN reaction that required hospitalization: No Has patient had a PCN reaction occurring within the last 10 years: Yes If all of the above answers are "NO", then may proceed with Cephalosporin use.    Tape Rash    No adhesive tape, but COBAN WRAP IS TOLERATED    MEDICATIONS: Current Outpatient Medications on File Prior to Visit  Medication Sig Dispense Refill   aspirin EC 81 MG tablet Take 81 mg by mouth daily.     Cholecalciferol (VITAMIN D3) LIQD by Does not apply route.     CRANBERRY-VITAMIN C PO Take by mouth 2 (two) times daily.      DULoxetine (CYMBALTA) 20 MG capsule 20-40 mg daily.     lisinopril (PRINIVIL,ZESTRIL) 10 MG tablet Take 1 tablet (10 mg total) by mouth daily. 30 tablet 0   metoprolol succinate (TOPROL-XL) 50 MG 24 hr tablet Take 50 mg by mouth daily. Take with or immediately following a meal.     Multiple Vitamin (MULTIVITAMIN) tablet Take 1 tablet by mouth daily.     Omega-3 Fatty Acids (FISH OIL PO) Take 1 capsule by mouth daily.     omeprazole (PRILOSEC) 20 MG capsule Take 20 mg by mouth daily.     rosuvastatin (CRESTOR) 5 MG tablet Take 5 mg by mouth at bedtime.   1   tamsulosin (FLOMAX) 0.4 MG CAPS capsule Take 1 capsule (0.4 mg total) by mouth 2 (two) times daily. 60 capsule 0   albuterol (PROVENTIL HFA;VENTOLIN HFA) 108 (90 BASE) MCG/ACT inhaler Inhale 2 puffs into the lungs every 6 (six) hours as needed for shortness of breath.      levothyroxine (SYNTHROID) 100 MCG tablet Take 1 tablet (100 mcg total) by mouth daily before breakfast. (Patient not taking: Reported on 02/01/2019) 90 tablet 1  No current facility-administered medications on file prior to visit.     PAST MEDICAL HISTORY: Past Medical History:  Diagnosis Date   Anxiety    Arthritis    lumbar DDD   Asthma    Basal cell carcinoma of nose    Chronic lower back pain    Coronary artery disease    Dysrhythmia    Fibromyalgia    GERD (gastroesophageal reflux disease)    High cholesterol    Hypertension    Hypothyroidism    Normal cardiac stress test    PONV (postoperative nausea and vomiting)    Shortness of breath    /w walking long distance    Swallowing difficulty    being seen by ENT- had endoscopy- told that its probably related to past neck surgeries     PAST SURGICAL HISTORY: Past Surgical History:  Procedure Laterality Date   ANTERIOR CERVICAL DECOMP/DISCECTOMY FUSION  2010   APPENDECTOMY     BACK SURGERY     BASAL CELL CARCINOMA EXCISION     "cut off nose"   CARDIAC  CATHETERIZATION  1970s; 1990s?; 2017   CATARACT EXTRACTION W/ INTRAOCULAR LENS  IMPLANT, BILATERAL Bilateral    CORONARY ANGIOPLASTY WITH STENT PLACEMENT  ~ 2017   DECOMPRESSIVE LUMBAR LAMINECTOMY LEVEL 1 N/A 12/15/2016   Procedure: DECOMPRESSIVE  LAMINECTOMY Thoracic twelve to lumbar one;  Surgeon: Kary Kos, MD;  Location: [facility];  Service: Neurosurgery;  Laterality: N/A;   DILATION AND CURETTAGE OF UTERUS     OOPHORECTOMY     "?side"   POSTERIOR FUSION LUMBAR SPINE  ~ 2013; 2016   POSTERIOR LUMBAR FUSION 4 LEVEL N/A 12/15/2016   Procedure: Posterior Fusion Thoracic- ten to Lumbar- two;  Surgeon: Kary Kos, MD;  Location: [facility];  Service: Neurosurgery;  Laterality: N/A;   TONSILLECTOMY     TOTAL THYROIDECTOMY  1990's   TUBAL LIGATION      SOCIAL HISTORY: Social History   Tobacco Use   Smoking status: Never Smoker   Smokeless tobacco: Never Used  Substance Use Topics   Alcohol use: No   Drug use: No    FAMILY HISTORY: Family History  Problem Relation Age of Onset   Cancer - Colon Mother    Thyroid disease Mother    Obesity Mother    Heart disease Father    High Cholesterol Father    Stroke Father    Bone cancer Brother    Heart disease Brother    ROS: Review of Systems  Constitutional: Positive for malaise/Kim.  HENT: Positive for sinus pain.        Positive for hay fever. Positive for difficult or painful swallowing.  Eyes:       Positive for wearing glasses or contacts.  Respiratory: Negative for shortness of breath.   Cardiovascular: Negative for chest pain, palpitations, orthopnea and claudication.  Gastrointestinal: Positive for heartburn. Negative for nausea.       Positive for swallowing difficulty.  Genitourinary:       Positive for polyuria.  Musculoskeletal: Negative for myalgias.       Positive for neck stiffness. Positive for muscle stiffness.  Neurological: Positive for headaches.  Endo/Heme/Allergies:       Negative for  hot/cold intolerance.   PHYSICAL EXAM: Blood pressure 127/68, pulse (!) 50, temperature 97.6 F (36.4 C), temperature source Oral, height 5\' 1"  (1.549 m), weight 193 lb (87.5 kg), SpO2 97 %. Body mass index is 36.47 kg/m. Physical Exam Vitals signs reviewed.  Constitutional:  Appearance: Normal appearance. She is well-developed. She is obese.  HENT:     Head: Normocephalic and atraumatic.     Nose: Nose normal.  Eyes:     General: No scleral icterus. Neck:     Musculoskeletal: Normal range of motion.  Cardiovascular:     Rate and Rhythm: Normal rate and regular rhythm.  Pulmonary:     Effort: Pulmonary effort is normal. No respiratory distress.  Abdominal:     Palpations: Abdomen is soft.     Tenderness: There is no abdominal tenderness.  Musculoskeletal: Normal range of motion.     Comments: Range of motion normal in all four extremities.  Skin:    General: Skin is warm and dry.  Neurological:     Mental Status: She is alert and oriented to person, place, and time.     Coordination: Coordination normal.  Psychiatric:        Mood and Affect: Mood and affect normal.        Behavior: Behavior normal.   RECENT LABS AND TESTS: BMET    Component Value Date/Time   NA 135 12/27/2016 0613   K 4.4 12/27/2016 0613   CL 102 12/27/2016 0613   CO2 27 12/27/2016 0613   GLUCOSE 122 (H) 12/27/2016 0613   BUN 17 12/27/2016 0613   CREATININE 0.64 12/27/2016 0613   CALCIUM 8.3 (L) 12/27/2016 0613   GFRNONAA >60 12/27/2016 0613   GFRAA >60 12/27/2016 0613   Lab Results  Component Value Date   HGBA1C 5.8 (H) 12/22/2016   No results found for: INSULIN CBC    Component Value Date/Time   WBC 7.7 01/03/2017 1205   RBC 4.05 01/03/2017 1205   HGB 11.9 (L) 01/03/2017 1205   HCT 37.3 01/03/2017 1205   PLT 179 01/03/2017 1205   MCV 92.1 01/03/2017 1205   MCH 29.4 01/03/2017 1205   MCHC 31.9 01/03/2017 1205   RDW 16.4 (H) 01/03/2017 1205   LYMPHSABS 1.8 01/03/2017 1205    MONOABS 0.6 01/03/2017 1205   EOSABS 0.1 01/03/2017 1205   BASOSABS 0.0 01/03/2017 1205   Iron/TIBC/Ferritin/ %Sat No results found for: IRON, TIBC, FERRITIN, IRONPCTSAT Lipid Panel  No results found for: CHOL, TRIG, HDL, CHOLHDL, VLDL, LDLCALC, LDLDIRECT Hepatic Function Panel     Component Value Date/Time   PROT 4.9 (L) 12/22/2016 0546   ALBUMIN 2.5 (L) 12/22/2016 0546   AST 36 12/22/2016 0546   ALT 85 (H) 12/22/2016 0546   ALKPHOS 35 (L) 12/22/2016 0546   BILITOT 1.0 12/22/2016 0546      Component Value Date/Time   TSH 1.07 01/18/2019 1510   TSH 0.12 (L) 07/24/2018 1247   No results found for: Vitamin D, 25-Hydroxy  ECG shows sinus bradycardia with a rate of 53 BPM, occasional PAC, # PACs = 1. Low voltage in precordial leads. Anteroseptal infarct, age undetermined. Abnormal.  INDIRECT CALORIMETER done today shows a VO2 of 144 and a REE of 999.  Her calculated basal metabolic rate is [id number] thus her basal metabolic rate is worse than expected.  OBESITY BEHAVIORAL INTERVENTION VISIT  Today's visit was #1  Starting weight: 193 lbs Starting date: 02/01/2019 Today's weight: 193 lbs  Today's date: 02/01/2019 Total lbs lost to date: 0 At least 15 minutes were spent on discussing the following behavioral intervention visit.    02/01/2019  Height 5\' 1"  (1.549 m)  Weight 193 lb (87.5 kg)  BMI (Calculated) 36.49  BLOOD PRESSURE - SYSTOLIC [id number]  BLOOD PRESSURE - DIASTOLIC  68  Waist Measurement  44 inches   Body Fat % 51.3 %  Total Body Water (lbs) 67.8 lbs   ASK: We discussed the diagnosis of obesity with Bary Richard today and Carolene agreed to give [place] permission to discuss obesity behavioral modification therapy today.  ASSESS: Catrin has the diagnosis of obesity and her BMI today is 36.6. Emera is in the action stage of change.   ADVISE: Chirsty was educated on the multiple health risks of obesity as well as the benefit of weight loss to improve her health. She was advised  of the need for long term treatment and the importance of lifestyle modifications to improve her current health and to decrease her risk of future health problems.  AGREE: Multiple dietary modification options and treatment options were discussed and  [place]land agreed to follow the recommendations documented in the above note.  ARRANGE: Anjelita was educated on the importance of frequent visits to treat obesity as outlined per CMS and USPSTF guidelines and agreed to schedule her next follow up appointment today.  Migdalia Dk, am acting as Location manager for [employer], DO   I have reviewed the above documentation for accuracy and completeness, and I agree with the above. -Jearld Lesch, DO

## 2019-02-02 LAB — INSULIN, RANDOM: INSULIN: 12.6 u[IU]/mL (ref 2.6–24.9)

## 2019-02-02 LAB — VITAMIN D 25 HYDROXY (VIT D DEFICIENCY, FRACTURES): Vit D, 25-Hydroxy: 44.1 ng/mL (ref 30.0–100.0)

## 2019-02-07 ENCOUNTER — Other Ambulatory Visit (INDEPENDENT_AMBULATORY_CARE_PROVIDER_SITE_OTHER): Payer: Self-pay

## 2019-02-15 ENCOUNTER — Ambulatory Visit (INDEPENDENT_AMBULATORY_CARE_PROVIDER_SITE_OTHER): Payer: Medicare Other | Admitting: Bariatrics

## 2019-02-15 ENCOUNTER — Other Ambulatory Visit: Payer: Self-pay

## 2019-02-15 ENCOUNTER — Encounter (INDEPENDENT_AMBULATORY_CARE_PROVIDER_SITE_OTHER): Payer: Self-pay | Admitting: Bariatrics

## 2019-02-15 VITALS — BP 125/65 | HR 51 | Temp 98.2°F | Ht 61.0 in | Wt 189.0 lb

## 2019-02-15 DIAGNOSIS — R7303 Prediabetes: Secondary | ICD-10-CM

## 2019-02-15 DIAGNOSIS — I10 Essential (primary) hypertension: Secondary | ICD-10-CM | POA: Diagnosis not present

## 2019-02-15 DIAGNOSIS — Z6835 Body mass index (BMI) 35.0-35.9, adult: Secondary | ICD-10-CM

## 2019-02-15 DIAGNOSIS — E559 Vitamin D deficiency, unspecified: Secondary | ICD-10-CM | POA: Diagnosis not present

## 2019-02-20 NOTE — Progress Notes (Signed)
Office: [phone]  /  Fax: [phone]   HPI:   Chief Complaint: OBESITY Kim Mcgee is here to discuss her progress with her obesity treatment plan. She is on the Category 1 plan and is following her eating plan approximately 93% of the time. She states she is walking/biking 15-20 minutes 7 times per week. Kim Mcgee is down 4 lbs. She thinks the plan was easy but does report having trouble with lunch on occasion.  Her weight is 189 lb (85.7 kg) today and has had a weight loss of 4 pounds over a period of 2 weeks since her last visit. She has lost 4 lbs since starting treatment with [place].  Pre-Diabetes (steroid-induced) Kim Mcgee has a diagnosis of prediabetes based on her elevated Hgb A1c and was informed this puts her at greater risk of developing diabetes. Last insulin 12.6 on 02/01/2019. She is on no medication currently and continues to work on diet and exercise to decrease risk of diabetes. She denies nausea or hypoglycemia.  Hypertension Kim Mcgee is a 78 y.o. female with hypertension. Kim Mcgee denies chest pain or shortness of breath on exertion. She is working weight loss to help control her blood pressure with the goal of decreasing her risk of heart attack and stroke. Kim Mcgee's blood pressure is well controlled at 125/65.  Vitamin D deficiency Kim Mcgee has a diagnosis of Vitamin D deficiency, which is controlled. Last Vitamin D 44.1 on 02/01/2019. She is currently taking OTC Vit D and denies nausea, vomiting or muscle weakness.  ASSESSMENT AND PLAN:  Prediabetes  Essential hypertension  Vitamin D deficiency  Class 2 severe obesity with serious comorbidity and body mass index (BMI) of 35.0 to 35.9 in adult, unspecified obesity type [facility])  PLAN:  Pre-Diabetes Kim Mcgee will continue to work on weight loss, exercise, and decreasing simple carbohydrates in her diet to help decrease the risk of diabetes. We dicussed metformin including benefits and risks. She was informed that eating  too many simple carbohydrates or too many calories at one sitting increases the likelihood of GI side effects. Kim Mcgee was instructed to decrease carbohydrates, increase protein, and increase activities. She will follow-up with [place] as directed to monitor her progress.  Hypertension We discussed sodium restriction, working on healthy weight loss, and a regular exercise program as the means to achieve improved blood pressure control. Kim Mcgee agreed with this plan and agreed to follow up as directed. We will continue to monitor her blood pressure as well as her progress with the above lifestyle modifications. She will continue her medications as prescribed and will watch for signs of hypotension as she continues her lifestyle modifications.  Vitamin D Deficiency Kim Mcgee was informed that low Vitamin D levels contributes to fatigue and are associated with obesity, breast, and colon cancer. She agrees to continue taking OTC Vit D and will follow-up for routine testing of Vitamin D, at least 2-3 times per year. She was informed of the risk of over-replacement of Vitamin D and agrees to not increase her dose unless she discusses this with [place] first. Kim Mcgee agrees to follow-up with our clinic in 2 weeks.  Obesity Kim Mcgee is currently in the action stage of change. As such, her goal is to continue with weight loss efforts. She has agreed to follow the Category 1 plan. Kim Mcgee will work on meal planning and intentional eating. She was given sheet on Eating Out, Lunch Options for Category 1 and 2, and Breakfast Options. Kim Mcgee has been instructed to use hand light weights and resistance  band exercises for weight loss and overall health benefits. We discussed the following Behavioral Modification Strategies today: increasing lean protein intake, decreasing simple carbohydrates, increasing vegetables, increase H20 intake, decrease eating out, no skipping meals, work on meal planning and easy cooking plans, keeping healthy foods  in the home, and planning for success.  Kim Mcgee has agreed to follow-up with our clinic in 2 weeks. She was informed of the importance of frequent follow-up visits to maximize her success with intensive lifestyle modifications for her multiple health conditions.  ALLERGIES: Allergies  Allergen Reactions  . Lyrica [Pregabalin]     Confused/agitated  . Macrodantin [Nitrofurantoin] Other (See Comments)    Chest pain  . Morphine And Related Nausea Only    CAUSED THE DRY HEAVES; CANNOT TAKE AGAIN!!  . Penicillins Rash and Other (See Comments)    Chest pain, also Has patient had a PCN reaction causing immediate rash, facial/tongue/throat swelling, SOB or lightheadedness with hypotension: Yes Has patient had a PCN reaction causing severe rash involving mucus membranes or skin necrosis: No Has patient had a PCN reaction that required hospitalization: No Has patient had a PCN reaction occurring within the last 10 years: Yes If all of the above answers are "NO", then may proceed with Cephalosporin use.   . Tape Rash    No adhesive tape, but COBAN WRAP IS TOLERATED    MEDICATIONS: Current Outpatient Medications on File Prior to Visit  Medication Sig Dispense Refill  . albuterol (PROVENTIL HFA;VENTOLIN HFA) 108 (90 BASE) MCG/ACT inhaler Inhale 2 puffs into the lungs every 6 (six) hours as needed for shortness of breath.     Marland Kitchen aspirin EC 81 MG tablet Take 81 mg by mouth daily.    . Cholecalciferol (VITAMIN D3) LIQD by Does not apply route.    Marland Kitchen CRANBERRY-VITAMIN C PO Take by mouth 2 (two) times daily.    . DULoxetine (CYMBALTA) 20 MG capsule 20-40 mg daily.    Marland Kitchen levothyroxine (SYNTHROID) 100 MCG tablet Take 1 tablet (100 mcg total) by mouth daily before breakfast. 90 tablet 1  . lisinopril (PRINIVIL,ZESTRIL) 10 MG tablet Take 1 tablet (10 mg total) by mouth daily. 30 tablet 0  . metoprolol succinate (TOPROL-XL) 50 MG 24 hr tablet Take 50 mg by mouth daily. Take with or immediately following a  meal.    . Multiple Vitamin (MULTIVITAMIN) tablet Take 1 tablet by mouth daily.    . Omega-3 Fatty Acids (FISH OIL PO) Take 1 capsule by mouth daily.    Marland Kitchen omeprazole (PRILOSEC) 20 MG capsule Take 20 mg by mouth daily.    . rosuvastatin (CRESTOR) 5 MG tablet Take 5 mg by mouth at bedtime.   1  . tamsulosin (FLOMAX) 0.4 MG CAPS capsule Take 1 capsule (0.4 mg total) by mouth 2 (two) times daily. 60 capsule 0   No current facility-administered medications on file prior to visit.     PAST MEDICAL HISTORY: Past Medical History:  Diagnosis Date  . Anxiety   . Arthritis    lumbar DDD  . Asthma   . Basal cell carcinoma of nose   . Chronic lower back pain   . Coronary artery disease   . Dysrhythmia   . Fibromyalgia   . GERD (gastroesophageal reflux disease)   . High cholesterol   . Hypertension   . Hypothyroidism   . Normal cardiac stress test   . PONV (postoperative nausea and vomiting)   . Shortness of breath    /w walking long  distance   . Swallowing difficulty    being seen by ENT- had endoscopy- told that its probably related to past neck surgeries     PAST SURGICAL HISTORY: Past Surgical History:  Procedure Laterality Date  . ANTERIOR CERVICAL DECOMP/DISCECTOMY FUSION  2010  . APPENDECTOMY    . BACK SURGERY    . BASAL CELL CARCINOMA EXCISION     "cut off nose"  . CARDIAC CATHETERIZATION  1970s; 1990s?; 2017  . CATARACT EXTRACTION W/ INTRAOCULAR LENS  IMPLANT, BILATERAL Bilateral   . CORONARY ANGIOPLASTY WITH STENT PLACEMENT  ~ 2017  . DECOMPRESSIVE LUMBAR LAMINECTOMY LEVEL 1 N/A 12/15/2016   Procedure: DECOMPRESSIVE  LAMINECTOMY Thoracic twelve to lumbar one;  Surgeon: Kary Kos, MD;  Location: [place];  Service: Neurosurgery;  Laterality: N/A;  . DILATION AND CURETTAGE OF UTERUS    . OOPHORECTOMY     "?side"  . POSTERIOR FUSION LUMBAR SPINE  ~ 2013; 2016  . POSTERIOR LUMBAR FUSION 4 LEVEL N/A 12/15/2016   Procedure: Posterior Fusion Thoracic- ten to Lumbar- two;   Surgeon: Kary Kos, MD;  Location: [place];  Service: Neurosurgery;  Laterality: N/A;  . TONSILLECTOMY    . TOTAL THYROIDECTOMY  1990's  . TUBAL LIGATION      SOCIAL HISTORY: Social History   Tobacco Use  . Smoking status: Never Smoker  . Smokeless tobacco: Never Used  Substance Use Topics  . Alcohol use: No  . Drug use: No    FAMILY HISTORY: Family History  Problem Relation Age of Onset  . Cancer - Colon Mother   . Thyroid disease Mother   . Obesity Mother   . Heart disease Father   . High Cholesterol Father   . Stroke Father   . Bone cancer Brother   . Heart disease Brother    ROS: Review of Systems  Gastrointestinal: Negative for nausea and vomiting.  Musculoskeletal:       Negative for muscle weakness.  Endo/Heme/Allergies:       Negative for hypoglycemia.   PHYSICAL EXAM: Blood pressure 125/65, pulse (!) 51, temperature 98.2 F (36.8 C), temperature source Oral, height 5\' 1"  (1.549 m), weight 189 lb (85.7 kg), SpO2 98 %. Body mass index is 35.71 kg/m. Physical Exam Vitals signs reviewed.  Constitutional:      Appearance: Normal appearance. She is obese.  Cardiovascular:     Rate and Rhythm: Normal rate.     Pulses: Normal pulses.  Pulmonary:     Effort: Pulmonary effort is normal.     Breath sounds: Normal breath sounds.  Musculoskeletal: Normal range of motion.  Skin:    General: Skin is warm and dry.  Neurological:     Mental Status: She is alert and oriented to person, place, and time.  Psychiatric:        Behavior: Behavior normal.   RECENT LABS AND TESTS: BMET    Component Value Date/Time   NA 144 12/13/2018   K 4.3 12/13/2018   CL 102 12/27/2016 0613   CO2 27 12/27/2016 0613   GLUCOSE 122 (H) 12/27/2016 0613   BUN 17 12/13/2018   CREATININE 0.7 12/13/2018   CREATININE 0.64 12/27/2016 0613   CALCIUM 8.3 (L) 12/27/2016 0613   GFRNONAA >60 12/27/2016 0613   GFRAA >60 12/27/2016 [id number]   Lab Results  Component Value Date   HGBA1C 5.8  (H) 12/22/2016   Lab Results  Component Value Date   INSULIN 12.6 02/01/2019   CBC    Component Value  Date/Time   WBC 11.2 12/13/2018   WBC 7.7 01/03/2017 1205   RBC 4.05 01/03/2017 1205   HGB 13.3 12/13/2018   HCT 37.3 01/03/2017 1205   PLT 259 12/13/2018   MCV 92.1 01/03/2017 1205   MCH 29.4 01/03/2017 1205   MCHC 31.9 01/03/2017 1205   RDW 16.4 (H) 01/03/2017 1205   LYMPHSABS 1.8 01/03/2017 1205   MONOABS 0.6 01/03/2017 1205   EOSABS 0.1 01/03/2017 1205   BASOSABS 0.0 01/03/2017 1205   Iron/TIBC/Ferritin/ %Sat No results found for: IRON, TIBC, FERRITIN, IRONPCTSAT Lipid Panel     Component Value Date/Time   CHOL 150 12/13/2018   TRIG 68 12/13/2018   HDL 66 12/13/2018   LDLCALC 70 12/13/2018   Hepatic Function Panel     Component Value Date/Time   PROT 4.9 (L) 12/22/2016 0546   ALBUMIN 2.5 (L) 12/22/2016 0546   AST 19 12/13/2018   ALT 23 12/13/2018   ALKPHOS 60 12/13/2018   BILITOT 1.0 12/22/2016 0546      Component Value Date/Time   TSH 1.07 01/18/2019 1510   TSH 0.11 (A) 12/13/2018   TSH 0.12 (L) 07/24/2018 1247   Results for NELSA, KUTZLER (MRN [id number]) as of 02/20/2019 10:24  Ref. Range 02/01/2019 11:00  Vitamin D, 25-Hydroxy Latest Ref Range: 30.0 - 100.0 ng/mL 44.1   OBESITY BEHAVIORAL INTERVENTION VISIT  Today's visit was #2   Starting weight: 193 lbs Starting date: 02/01/2019 Today's weight: 189 lbs Today's date: 02/15/2019 Total lbs lost to date: 4 At least 15 minutes were spent on discussing the following behavioral intervention visit.    02/15/2019  Height 5\' 1"  (1.549 m)  Weight 189 lb (85.7 kg)  BMI (Calculated) 35.73  BLOOD PRESSURE - SYSTOLIC 0000000  BLOOD PRESSURE - DIASTOLIC 65   Body Fat % 123XX123 %  Total Body Water (lbs) 64.8 lbs   ASK: We discussed the diagnosis of obesity with Bary Richard today and Janiylah agreed to give [place] permission to discuss obesity behavioral modification therapy today.  ASSESS: Savanna has the diagnosis  of obesity and her BMI today is 35.7. Moncerat is in the action stage of change.  ADVISE: Eliyana was educated on the multiple health risks of obesity as well as the benefit of weight loss to improve her health. She was advised of the need for long term treatment and the importance of lifestyle modifications to improve her current health and to decrease her risk of future health problems.  AGREE: Multiple dietary modification options and treatment options were discussed and  Esthefany agreed to follow the recommendations documented in the above note.  ARRANGE: Junell was educated on the importance of frequent visits to treat obesity as outlined per CMS and USPSTF guidelines and agreed to schedule her next follow up appointment today.  Migdalia Dk, am acting as Location manager for [employer], DO  I have reviewed the above documentation for accuracy and completeness, and I agree with the above. -Jearld Lesch, DO

## 2019-03-06 ENCOUNTER — Ambulatory Visit (INDEPENDENT_AMBULATORY_CARE_PROVIDER_SITE_OTHER): Payer: Medicare Other | Admitting: Bariatrics

## 2019-03-06 ENCOUNTER — Other Ambulatory Visit: Payer: Self-pay

## 2019-03-06 ENCOUNTER — Encounter (INDEPENDENT_AMBULATORY_CARE_PROVIDER_SITE_OTHER): Payer: Self-pay | Admitting: Bariatrics

## 2019-03-06 VITALS — BP 110/67 | HR 53 | Temp 98.1°F | Ht 61.0 in | Wt 188.0 lb

## 2019-03-06 DIAGNOSIS — R7303 Prediabetes: Secondary | ICD-10-CM

## 2019-03-06 DIAGNOSIS — E559 Vitamin D deficiency, unspecified: Secondary | ICD-10-CM

## 2019-03-06 DIAGNOSIS — Z6835 Body mass index (BMI) 35.0-35.9, adult: Secondary | ICD-10-CM

## 2019-03-06 NOTE — Progress Notes (Signed)
Office: [phone]  /  Fax: [phone]   HPI:   Chief Complaint: OBESITY Kim Mcgee is here to discuss her progress with her obesity treatment plan. She is on the Category 1 plan and is following her eating plan approximately 90% of the time. She states she is walking/biking 10-15 minutes 7 times per week. Kim Mcgee is down 1 lb since her last visit. She has had problems with getting adequate water.  Her weight is 188 lb (85.3 kg) today and has had a weight loss of 1 pound over a period of 3 weeks since her last visit. She has lost 5 lbs since starting treatment with [place].  Pre-Diabetes Kim Mcgee has a diagnosis of prediabetes based on her elevated Hgb A1c and was informed this puts her at greater risk of developing diabetes. She is not taking metformin currently and continues to work on diet and exercise to decrease risk of diabetes. She denies nausea or hypoglycemia. No polyphagia.  Vitamin D deficiency Kim Mcgee has a diagnosis of Vitamin D deficiency. Last Vitamin D 44.1 on 02/01/2019. She is currently taking Vit D and denies nausea, vomiting or muscle weakness.  ASSESSMENT AND PLAN:  Prediabetes  Vitamin D deficiency  Class 2 severe obesity with serious comorbidity and body mass index (BMI) of 35.0 to 35.9 in adult, unspecified obesity type Kim Mcgee)  PLAN:  Pre-Diabetes Kim Mcgee will continue to work on weight loss, exercise, and decreasing simple carbohydrates in her diet to help decrease the risk of diabetes. We dicussed metformin including benefits and risks. She was informed that eating too many simple carbohydrates or too many calories at one sitting increases the likelihood of GI side effects. Kim Mcgee was instructed to decrease carbohydrates, increase protein and fatty fats. She will follow-up with [place] as directed to monitor her progress.  Vitamin D Deficiency Kim Mcgee was informed that low Vitamin D levels contributes to fatigue and are associated with obesity, breast, and colon cancer. She agrees  to continue taking Vit D and will follow-up for routine testing of Vitamin D, at least 2-3 times per year. She was informed of the risk of over-replacement of Vitamin D and agrees to not increase her dose unless she discusses this with [place] first. Kim Mcgee agrees to follow-up with our clinic in 2 weeks.  Obesity Kim Mcgee is currently in the action stage of change. As such, her goal is to continue with weight loss efforts. She has agreed to follow the Category 1 plan. Kim Mcgee will work on meal planning, intentional eating, and increasing her water intake. Recipes were given. Kim Mcgee has been instructed to increase exercise for weight loss and overall health benefits. We discussed the following Behavioral Modification Strategies today: increasing lean protein intake, decreasing simple carbohydrates, increasing vegetables, increase H20 intake, decrease eating out, no skipping meals, work on meal planning and easy cooking plans, and keeping healthy foods in the home.  Kim Mcgee has agreed to follow-up with our clinic in 2 weeks. She was informed of the importance of frequent follow-up visits to maximize her success with intensive lifestyle modifications for her multiple health conditions.  ALLERGIES: Allergies  Allergen Reactions   Lyrica [Pregabalin]     Confused/agitated   Macrodantin [Nitrofurantoin] Other (See Comments)    Chest pain   Morphine And Related Nausea Only    CAUSED THE DRY HEAVES; CANNOT TAKE AGAIN!!   Penicillins Rash and Other (See Comments)    Chest pain, also Has patient had a PCN reaction causing immediate rash, facial/tongue/throat swelling, SOB or lightheadedness with hypotension:  Yes Has patient had a PCN reaction causing severe rash involving mucus membranes or skin necrosis: No Has patient had a PCN reaction that required hospitalization: No Has patient had a PCN reaction occurring within the last 10 years: Yes If all of the above answers are "NO", then may proceed with  Cephalosporin use.    Tape Rash    No adhesive tape, but COBAN WRAP IS TOLERATED    MEDICATIONS: Current Outpatient Medications on File Prior to Visit  Medication Sig Dispense Refill   albuterol (PROVENTIL HFA;VENTOLIN HFA) 108 (90 BASE) MCG/ACT inhaler Inhale 2 puffs into the lungs every 6 (six) hours as needed for shortness of breath.      aspirin EC 81 MG tablet Take 81 mg by mouth daily.     Cholecalciferol (VITAMIN D3) LIQD by Does not apply route.     CRANBERRY-VITAMIN C PO Take by mouth 2 (two) times daily.     DULoxetine (CYMBALTA) 20 MG capsule 20-40 mg daily.     levothyroxine (SYNTHROID) 100 MCG tablet Take 1 tablet (100 mcg total) by mouth daily before breakfast. 90 tablet 1   lisinopril (PRINIVIL,ZESTRIL) 10 MG tablet Take 1 tablet (10 mg total) by mouth daily. 30 tablet 0   metoprolol succinate (TOPROL-XL) 50 MG 24 hr tablet Take 50 mg by mouth daily. Take with or immediately following a meal.     Multiple Vitamin (MULTIVITAMIN) tablet Take 1 tablet by mouth daily.     Omega-3 Fatty Acids (FISH OIL PO) Take 1 capsule by mouth daily.     omeprazole (PRILOSEC) 20 MG capsule Take 20 mg by mouth daily.     rosuvastatin (CRESTOR) 5 MG tablet Take 5 mg by mouth at bedtime.   1   tamsulosin (FLOMAX) 0.4 MG CAPS capsule Take 1 capsule (0.4 mg total) by mouth 2 (two) times daily. 60 capsule 0   No current facility-administered medications on file prior to visit.     PAST MEDICAL HISTORY: Past Medical History:  Diagnosis Date   Anxiety    Arthritis    lumbar DDD   Asthma    Basal cell carcinoma of nose    Chronic lower back pain    Coronary artery disease    Dysrhythmia    Fibromyalgia    GERD (gastroesophageal reflux disease)    High cholesterol    Hypertension    Hypothyroidism    Normal cardiac stress test    PONV (postoperative nausea and vomiting)    Shortness of breath    /w walking long distance    Swallowing difficulty    being  seen by ENT- had endoscopy- told that its probably related to past neck surgeries     PAST SURGICAL HISTORY: Past Surgical History:  Procedure Laterality Date   ANTERIOR CERVICAL DECOMP/DISCECTOMY FUSION  2010   APPENDECTOMY     BACK SURGERY     BASAL CELL CARCINOMA EXCISION     "cut off nose"   CARDIAC CATHETERIZATION  1970s; 1990s?; 2017   CATARACT EXTRACTION W/ INTRAOCULAR LENS  IMPLANT, BILATERAL Bilateral    CORONARY ANGIOPLASTY WITH STENT PLACEMENT  ~ 2017   DECOMPRESSIVE LUMBAR LAMINECTOMY LEVEL 1 N/A 12/15/2016   Procedure: DECOMPRESSIVE  LAMINECTOMY Thoracic twelve to lumbar one;  Surgeon: Kary Kos, MD;  Location: [place];  Service: Neurosurgery;  Laterality: N/A;   DILATION AND CURETTAGE OF UTERUS     OOPHORECTOMY     "?side"   POSTERIOR FUSION LUMBAR SPINE  ~ 2013; 2016  POSTERIOR LUMBAR FUSION 4 LEVEL N/A 12/15/2016   Procedure: Posterior Fusion Thoracic- ten to Lumbar- two;  Surgeon: Kary Kos, MD;  Location: [place];  Service: Neurosurgery;  Laterality: N/A;   TONSILLECTOMY     TOTAL THYROIDECTOMY  1990's   TUBAL LIGATION      SOCIAL HISTORY: Social History   Tobacco Use   Smoking status: Never Smoker   Smokeless tobacco: Never Used  Substance Use Topics   Alcohol use: No   Drug use: No    FAMILY HISTORY: Family History  Problem Relation Age of Onset   Cancer - Colon Mother    Thyroid disease Mother    Obesity Mother    Heart disease Father    High Cholesterol Father    Stroke Father    Bone cancer Brother    Heart disease Brother    ROS: Review of Systems  Gastrointestinal: Negative for nausea and vomiting.  Musculoskeletal:       Negative for muscle weakness.  Endo/Heme/Allergies:       Negative for polyphagia.   PHYSICAL EXAM: Blood pressure 110/67, pulse (!) 53, temperature 98.1 F (36.7 C), temperature source Oral, height 5\' 1"  (1.549 m), weight 188 lb (85.3 kg), SpO2 97 %. Body mass index is 35.52  kg/m. Physical Exam Vitals signs reviewed.  Constitutional:      Appearance: Normal appearance. She is obese.  Cardiovascular:     Rate and Rhythm: Normal rate.     Pulses: Normal pulses.  Pulmonary:     Effort: Pulmonary effort is normal.     Breath sounds: Normal breath sounds.  Musculoskeletal: Normal range of motion.  Skin:    General: Skin is warm and dry.  Neurological:     Mental Status: She is alert and oriented to person, place, and time.  Psychiatric:        Behavior: Behavior normal.   RECENT LABS AND TESTS: BMET    Component Value Date/Time   NA 144 12/13/2018   K 4.3 12/13/2018   CL 102 12/27/2016 0613   CO2 27 12/27/2016 0613   GLUCOSE 122 (H) 12/27/2016 0613   BUN 17 12/13/2018   CREATININE 0.7 12/13/2018   CREATININE 0.64 12/27/2016 0613   CALCIUM 8.3 (L) 12/27/2016 0613   GFRNONAA >60 12/27/2016 0613   GFRAA >60 12/27/2016 0613   Lab Results  Component Value Date   HGBA1C 5.8 (H) 12/22/2016   Lab Results  Component Value Date   INSULIN 12.6 02/01/2019   CBC    Component Value Date/Time   WBC 11.2 12/13/2018   WBC 7.7 01/03/2017 1205   RBC 4.05 01/03/2017 1205   HGB 13.3 12/13/2018   HCT 37.3 01/03/2017 1205   PLT 259 12/13/2018   MCV 92.1 01/03/2017 1205   MCH 29.4 01/03/2017 1205   MCHC 31.9 01/03/2017 1205   RDW 16.4 (H) 01/03/2017 1205   LYMPHSABS 1.8 01/03/2017 1205   MONOABS 0.6 01/03/2017 1205   EOSABS 0.1 01/03/2017 1205   BASOSABS 0.0 01/03/2017 1205   Iron/TIBC/Ferritin/ %Sat No results found for: IRON, TIBC, FERRITIN, IRONPCTSAT Lipid Panel     Component Value Date/Time   CHOL 150 12/13/2018   TRIG 68 12/13/2018   HDL 66 12/13/2018   LDLCALC 70 12/13/2018   Hepatic Function Panel     Component Value Date/Time   PROT 4.9 (L) 12/22/2016 0546   ALBUMIN 2.5 (L) 12/22/2016 0546   AST 19 12/13/2018   ALT 23 12/13/2018   ALKPHOS 60 12/13/2018  BILITOT 1.0 12/22/2016 0546      Component Value Date/Time   TSH 1.07  01/18/2019 1510   TSH 0.11 (A) 12/13/2018   TSH 0.12 (L) 07/24/2018 1247   Results for JAEDYNN, LABOSSIERE (MRN [id number]) as of 03/06/2019 17:08  Ref. Range 02/01/2019 11:00  Vitamin D, 25-Hydroxy Latest Ref Range: 30.0 - 100.0 ng/mL 44.1   OBESITY BEHAVIORAL INTERVENTION VISIT  Today's visit was #3  Starting weight: 193 lbs Starting date: 02/01/2019 Today's weight: 188 lbs  Today's date: 03/06/2019 Total lbs lost to date: 5 At least 15 minutes were spent on discussing the following behavioral intervention visit.    03/06/2019  Height 5\' 1"  (1.549 m)  Weight 188 lb (85.3 kg)  BMI (Calculated) 35.54  BLOOD PRESSURE - SYSTOLIC [id number]  BLOOD PRESSURE - DIASTOLIC 67   Body Fat % [id number] %  Total Body Water (lbs) 66 lbs   ASK: We discussed the diagnosis of obesity with Kim Mcgee today and Kim Mcgee agreed to give [place] permission to discuss obesity behavioral modification therapy today.  ASSESS: Kamilya has the diagnosis of obesity and her BMI today is 35.7. Kim Mcgee is in the action stage of change.   ADVISE: Milcah was educated on the multiple health risks of obesity as well as the benefit of weight loss to improve her health. She was advised of the need for long term treatment and the importance of lifestyle modifications to improve her current health and to decrease her risk of future health problems.  AGREE: Multiple dietary modification options and treatment options were discussed and  Kim Mcgee agreed to follow the recommendations documented in the above note.  ARRANGE: Loyalti was educated on the importance of frequent visits to treat obesity as outlined per CMS and USPSTF guidelines and agreed to schedule her next follow up appointment today.  Migdalia Dk, am acting as Location manager for [employer], DO  I have reviewed the above documentation for accuracy and completeness, and I agree with the above. -Jearld Lesch, DO

## 2019-03-07 ENCOUNTER — Encounter (INDEPENDENT_AMBULATORY_CARE_PROVIDER_SITE_OTHER): Payer: Self-pay | Admitting: Bariatrics

## 2019-03-22 ENCOUNTER — Other Ambulatory Visit: Payer: Self-pay

## 2019-03-22 ENCOUNTER — Ambulatory Visit (INDEPENDENT_AMBULATORY_CARE_PROVIDER_SITE_OTHER): Payer: Medicare Other | Admitting: Bariatrics

## 2019-03-22 ENCOUNTER — Encounter (INDEPENDENT_AMBULATORY_CARE_PROVIDER_SITE_OTHER): Payer: Self-pay | Admitting: Bariatrics

## 2019-03-22 VITALS — BP 104/64 | HR 54 | Temp 97.8°F | Ht 61.0 in | Wt 187.0 lb

## 2019-03-22 DIAGNOSIS — E559 Vitamin D deficiency, unspecified: Secondary | ICD-10-CM

## 2019-03-22 DIAGNOSIS — E8881 Metabolic syndrome: Secondary | ICD-10-CM

## 2019-03-22 DIAGNOSIS — Z6835 Body mass index (BMI) 35.0-35.9, adult: Secondary | ICD-10-CM | POA: Diagnosis not present

## 2019-03-25 NOTE — Progress Notes (Signed)
Office: [phone]  /  Fax: [phone]   HPI:   Chief Complaint: OBESITY Kim Mcgee is here to discuss her progress with her obesity treatment plan. She is on the  follow the Category 1 plan and is following her eating plan approximately 95 % of the time. She states she is exercising by walking for 30 minutes 7 times per week. Rumor is down 1 lb and is doing well overall. She struggles slightly with her protein.  Her weight is 187 lb (84.8 kg) today and has had a weight loss of 1 pounds over a period of 2 weeks since her last visit. She has lost 6 lbs since starting treatment with [place].  Insulin Resistance Kim Mcgee has a diagnosis of insulin resistance based on her elevated fasting insulin level of 12.6. Although Kim Mcgee's blood glucose readings are still under good control, insulin resistance puts her at greater risk of metabolic syndrome and diabetes. She is not taking metformin currently and continues to work on diet and exercise to decrease risk of diabetes.  Vitamin D deficiency Kim Mcgee has a diagnosis of vitamin D deficiency. She is currently taking vit D OTC and denies nausea, vomiting or muscle weakness.  ASSESSMENT AND PLAN:  Insulin resistance  Vitamin D deficiency  Class 2 severe obesity with serious comorbidity and body mass index (BMI) of 35.0 to 35.9 in adult, unspecified obesity type ([place])  PLAN: Insulin Resistance Kanishia will continue to work on weight loss, exercise, and decreasing simple carbohydrates in her diet to help decrease the risk of diabetes. We dicussed metformin including benefits and risks. She was informed that eating too many simple carbohydrates or too many calories at one sitting increases the likelihood of GI side effects. Artemisia agreed to follow up with [place] as directed to monitor her progress.  Vitamin D Deficiency Niang was informed that low vitamin D levels contributes to fatigue and are associated with obesity, breast, and colon cancer. She agrees to  continue to take vit D OTC daily and will follow up for routine testing of vitamin D, at least 2-3 times per year. She was informed of the risk of over-replacement of vitamin D and agrees to not increase her dose unless she discusses this with [place] first. Agrees to follow up with our clinic as directed.   Obesity Kim Mcgee is currently in the action stage of change. 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 We discussed the following Behavioral Modification Strategies today: increasing water intake, keeping healthy foods in the home, no skipping meals, increasing lean protein intake, decreasing simple carbohydrates , increasing vegetables, decrease eating out and work on meal planning and easy cooking plans We discussed meal planning and intentional eating.   Kim Mcgee has agreed to follow up with our clinic in 2 weeks. She was informed of the importance of frequent follow up visits to maximize her success with intensive lifestyle modifications for her multiple health conditions.  ALLERGIES: Allergies  Allergen Reactions   Lyrica [Pregabalin]     Confused/agitated   Macrodantin [Nitrofurantoin] Other (See Comments)    Chest pain   Morphine And Related Nausea Only    CAUSED THE DRY HEAVES; CANNOT TAKE  AGAIN!!   Penicillins Rash and Other (See Comments)    Chest pain, also Has patient had a PCN reaction causing immediate rash, facial/tongue/throat swelling, SOB or lightheadedness with hypotension: Yes Has patient had a PCN reaction causing severe rash involving mucus membranes or skin necrosis: No Has patient had a PCN reaction that required hospitalization: No Has patient had a PCN  reaction occurring within the last 10 years: Yes If all of the above answers are "NO", then may proceed with Cephalosporin use.    Tape Rash    No adhesive tape, but COBAN WRAP IS TOLERATED    MEDICATIONS: Current Outpatient Medications on File Prior to Visit  Medication Sig Dispense Refill   albuterol (PROVENTIL HFA;VENTOLIN HFA) 108 (90 BASE) MCG/ACT inhaler Inhale 2 puffs into the lungs every 6 (six) hours as needed for shortness of breath.      aspirin EC 81 MG tablet Take 81 mg by mouth daily.     Cholecalciferol (VITAMIN D3) LIQD by Does not apply route.     CRANBERRY-VITAMIN C PO Take by mouth 2 (two) times daily.     DULoxetine (CYMBALTA) 20 MG capsule 20-40 mg daily.     levothyroxine (SYNTHROID) 100 MCG tablet Take 1 tablet (100 mcg total) by mouth daily before breakfast. 90 tablet 1   lisinopril (PRINIVIL,ZESTRIL) 10 MG tablet Take 1 tablet (10 mg total) by mouth daily. 30 tablet 0   metoprolol succinate (TOPROL-XL) 50 MG 24 hr tablet Take 50 mg by mouth daily. Take with or immediately following a meal.     Multiple Vitamin (MULTIVITAMIN) tablet Take 1 tablet by mouth daily.     Omega-3 Fatty Acids (FISH OIL PO) Take 1 capsule by mouth daily.     omeprazole (PRILOSEC) 20 MG capsule Take 20 mg by mouth daily.     rosuvastatin (CRESTOR) 5 MG tablet Take 5 mg by mouth at bedtime.   1   tamsulosin (FLOMAX) 0.4 MG CAPS capsule Take 1 capsule (0.4 mg total) by mouth 2 (two) times daily. 60 capsule 0   No current facility-administered medications on file prior to visit.     PAST MEDICAL HISTORY: Past Medical History:  Diagnosis Date   Anxiety    Arthritis    lumbar DDD   Asthma    Basal cell carcinoma of nose    Chronic lower back pain    Coronary artery disease    Dysrhythmia    Fibromyalgia    GERD (gastroesophageal reflux disease)    High cholesterol    Hypertension    Hypothyroidism    Normal cardiac stress test    PONV (postoperative  nausea and vomiting)    Shortness of breath    /w walking long distance    Swallowing difficulty    being seen by ENT- had endoscopy- told that its probably related to past neck surgeries     PAST SURGICAL HISTORY: Past Surgical History:  Procedure Laterality Date   ANTERIOR CERVICAL DECOMP/DISCECTOMY FUSION  2010   APPENDECTOMY     BACK SURGERY     BASAL CELL CARCINOMA EXCISION     "cut off nose"   CARDIAC CATHETERIZATION  1970s; 1990s?; 2017   CATARACT EXTRACTION W/ INTRAOCULAR LENS  IMPLANT, BILATERAL Bilateral    CORONARY ANGIOPLASTY WITH STENT PLACEMENT  ~ 2017   DECOMPRESSIVE LUMBAR LAMINECTOMY LEVEL 1 N/A 12/15/2016   Procedure: DECOMPRESSIVE  LAMINECTOMY Thoracic twelve to lumbar one;  Surgeon: Kary Kos, MD;  Location: [facility];  Service:  Neurosurgery;  Laterality: N/A;   DILATION AND CURETTAGE OF UTERUS     OOPHORECTOMY     "?side"   POSTERIOR FUSION LUMBAR SPINE  ~ 2013; 2016   POSTERIOR LUMBAR FUSION 4 LEVEL N/A 12/15/2016   Procedure: Posterior Fusion Thoracic- ten to Lumbar- two;  Surgeon: Kary Kos, MD;  Location: [facility];  Service: Neurosurgery;  Laterality: N/A;   TONSILLECTOMY     TOTAL THYROIDECTOMY  1990's   TUBAL LIGATION      SOCIAL HISTORY: Social History   Tobacco Use   Smoking status: Never Smoker   Smokeless tobacco: Never Used  Substance Use Topics   Alcohol use: No   Drug use: No    FAMILY HISTORY: Family History  Problem Relation Age of Onset   Cancer - Colon Mother    Thyroid disease Mother    Obesity Mother    Heart disease Father    High Cholesterol Father    Stroke Father    Bone cancer Brother    Heart disease Brother     ROS: Review of Systems  Constitutional: Positive for weight loss.  Gastrointestinal: Negative for nausea and vomiting.  Musculoskeletal:       Negative for muscle weakness    PHYSICAL EXAM: Blood pressure 104/64, pulse (!) 54, temperature 97.8 F (36.6 C), temperature source  Oral, height 5\' 1"  (1.549 m), weight 187 lb (84.8 kg), SpO2 95 %. Body mass index is 35.33 kg/m. Physical Exam Vitals signs reviewed.  Constitutional:      Appearance: Normal appearance. She is obese.  HENT:     Head: Normocephalic.     Nose: Nose normal.  Neck:     Musculoskeletal: Normal range of motion.  Cardiovascular:     Rate and Rhythm: Normal rate.  Pulmonary:     Effort: Pulmonary effort is normal.  Musculoskeletal: Normal range of motion.  Skin:    General: Skin is warm and dry.  Neurological:     Mental Status: She is alert and oriented to person, place, and time.  Psychiatric:        Mood and Affect: Mood normal.        Behavior: Behavior normal.     RECENT LABS AND TESTS: BMET    Component Value Date/Time   NA 144 12/13/2018   K 4.3 12/13/2018   CL 102 12/27/2016 0613   CO2 27 12/27/2016 0613   GLUCOSE 122 (H) 12/27/2016 0613   BUN 17 12/13/2018   CREATININE 0.7 12/13/2018   CREATININE 0.64 12/27/2016 0613   CALCIUM 8.3 (L) 12/27/2016 0613   GFRNONAA >60 12/27/2016 0613   GFRAA >60 12/27/2016 0613   Lab Results  Component Value Date   HGBA1C 5.8 (H) 12/22/2016   Lab Results  Component Value Date   INSULIN 12.6 02/01/2019   CBC    Component Value Date/Time   WBC 11.2 12/13/2018   WBC 7.7 01/03/2017 1205   RBC 4.05 01/03/2017 1205   HGB 13.3 12/13/2018   HCT 37.3 01/03/2017 1205   PLT 259 12/13/2018   MCV 92.1 01/03/2017 1205   MCH 29.4 01/03/2017 1205   MCHC 31.9 01/03/2017 1205   RDW 16.4 (H) 01/03/2017 1205   LYMPHSABS 1.8 01/03/2017 1205   MONOABS 0.6 01/03/2017 1205   EOSABS 0.1 01/03/2017 1205   BASOSABS 0.0 01/03/2017 1205   Iron/TIBC/Ferritin/ %Sat No results found for: IRON, TIBC, FERRITIN, IRONPCTSAT Lipid Panel     Component Value Date/Time   CHOL 150 12/13/2018  TRIG 68 12/13/2018   HDL 66 12/13/2018   LDLCALC 70 12/13/2018   Hepatic Function Panel     Component Value Date/Time   PROT 4.9 (L) 12/22/2016 0546    ALBUMIN 2.5 (L) 12/22/2016 0546   AST 19 12/13/2018   ALT 23 12/13/2018   ALKPHOS 60 12/13/2018   BILITOT 1.0 12/22/2016 0546      Component Value Date/Time   TSH 1.07 01/18/2019 1510   TSH 0.11 (A) 12/13/2018   TSH 0.12 (L) 07/24/2018 1247     Ref. Range 02/01/2019 11:00  Vitamin D, 25-Hydroxy Latest Ref Range: 30.0 - 100.0 ng/mL 44.1     OBESITY BEHAVIORAL INTERVENTION VISIT  Today's visit was # 4   Starting weight: 193 lbs Starting date: 02/01/19 Today's weight : Weight: 187 lb (84.8 kg)  Today's date: 03/25/2019 Total lbs lost to date: 6 lbs At least 15 minutes were spent on discussing the following behavioral intervention visit.   ASK: We discussed the diagnosis of obesity with Bary Richard today and Juliah agreed to give [place] permission to discuss obesity behavioral modification therapy today.  ASSESS: Lizbette has the diagnosis of obesity and her BMI today is 35.35 Faithanne is in the action stage of change   ADVISE: Chelle was educated on the multiple health risks of obesity as well as the benefit of weight loss to improve her health. She was advised of the need for long term treatment and the importance of lifestyle modifications to improve her current health and to decrease her risk of future health problems.  AGREE: Multiple dietary modification options and treatment options were discussed and  Dosia agreed to follow the recommendations documented in the above note.  ARRANGE: Amerah was educated on the importance of frequent visits to treat obesity as outlined per CMS and USPSTF guidelines and agreed to schedule her next follow up appointment today.  Leary Roca, am acting as transcriptionist for [employer], DO   I have reviewed the above documentation for accuracy and completeness, and I agree with the above. -Jearld Lesch, DO

## 2019-03-26 ENCOUNTER — Encounter (INDEPENDENT_AMBULATORY_CARE_PROVIDER_SITE_OTHER): Payer: Self-pay | Admitting: Bariatrics

## 2019-04-11 ENCOUNTER — Ambulatory Visit (INDEPENDENT_AMBULATORY_CARE_PROVIDER_SITE_OTHER): Payer: Medicare Other | Admitting: Family Medicine

## 2019-04-19 ENCOUNTER — Ambulatory Visit (INDEPENDENT_AMBULATORY_CARE_PROVIDER_SITE_OTHER): Payer: Medicare Other | Admitting: Family Medicine

## 2019-04-19 ENCOUNTER — Encounter (INDEPENDENT_AMBULATORY_CARE_PROVIDER_SITE_OTHER): Payer: Self-pay | Admitting: Family Medicine

## 2019-04-19 ENCOUNTER — Other Ambulatory Visit: Payer: Self-pay

## 2019-04-19 VITALS — BP 112/51 | HR 51 | Temp 98.2°F | Ht 61.0 in | Wt 185.0 lb

## 2019-04-19 DIAGNOSIS — Z6835 Body mass index (BMI) 35.0-35.9, adult: Secondary | ICD-10-CM | POA: Diagnosis not present

## 2019-04-19 DIAGNOSIS — R7303 Prediabetes: Secondary | ICD-10-CM | POA: Diagnosis not present

## 2019-04-23 NOTE — Progress Notes (Signed)
Office: [phone]  /  Fax: [phone]   HPI:   Chief Complaint: OBESITY Kim Mcgee is here to discuss her progress with her obesity treatment plan. She is on the Category 1 plan and is following her eating plan approximately 90 % of the time. She states she is walking and stretching 15 to 20 minutes 3 times per week. Kim Mcgee is working on getting protein but she struggles with this. Hunger is well controlled. She sometimes has a microwave meal for dinner. Her weight is 185 lb (83.9 kg) today and has had a weight loss of 2 pounds over a period of 4 weeks since her last visit. She has lost 8 lbs since starting treatment with [place].  Pre-Diabetes  Kim Mcgee is not on metformin and she continues to work on diet and exercise to decrease risk of diabetes. She denies polyphagia. Lab Results  Component Value Date   HGBA1C 5.8 (H) 12/22/2016    ASSESSMENT AND PLAN:  Prediabetes  Class 2 severe obesity with serious comorbidity and body mass index (BMI) of 35.0 to 35.9 in adult, unspecified obesity type Kim Mcgee)  PLAN:  Pre-Diabetes Kim Mcgee will continue to work on weight loss, exercise, and decreasing simple carbohydrates in her diet to help decrease the risk of diabetes. Kim Mcgee will continue with the meal plan and follow up with [place] as directed to monitor her progress.  Obesity Kim Mcgee is currently in the action stage of change. As such, her goal is to continue with weight loss efforts She has agreed to follow the Category 1 plan Kim Mcgee will continue walking and stretching for 15 to 20 minutes, 3 times per week for weight loss and overall health benefits. We discussed the following Behavioral Modification Strategies today: planning for success, increasing lean protein intake, decreasing simple carbohydrates and celebration eating strategies  Handouts for Thanksgiving tips and holiday recipes were given to patient. Additional breakfast options were provided to patient. Kim Mcgee will add yogurt or milk when  having a microwave meal for dinner to increase the protein.  Kim Mcgee has agreed to follow up with our clinic in 2 to 3 weeks. She was informed of the importance of frequent follow up visits to maximize her success with intensive lifestyle modifications for her multiple health conditions.  ALLERGIES: Allergies  Allergen Reactions  . Lyrica [Pregabalin]     Confused/agitated  . Macrodantin [Nitrofurantoin] Other (See Comments)    Chest pain  . Morphine And Related Nausea Only    CAUSED THE DRY HEAVES; CANNOT TAKE AGAIN!!  . Penicillins Rash and Other (See Comments)    Chest pain, also Has patient had a PCN reaction causing immediate rash, facial/tongue/throat swelling, SOB or lightheadedness with hypotension: Yes Has patient had a PCN reaction causing severe rash involving mucus membranes or skin necrosis: No Has patient had a PCN reaction that required hospitalization: No Has patient had a PCN reaction occurring within the last 10 years: Yes If all of the above answers are "NO", then may proceed with Cephalosporin use.   . Tape Rash    No adhesive tape, but COBAN WRAP IS TOLERATED    MEDICATIONS: Current Outpatient Medications on File Prior to Visit  Medication Sig Dispense Refill  . albuterol (PROVENTIL HFA;VENTOLIN HFA) 108 (90 BASE) MCG/ACT inhaler Inhale 2 puffs into the lungs every 6 (six) hours as needed for shortness of breath.     Marland Kitchen aspirin EC 81 MG tablet Take 81 mg by mouth daily.    . Cholecalciferol (VITAMIN D3) LIQD by Does  not apply route.    Marland Kitchen CRANBERRY-VITAMIN C PO Take by mouth 2 (two) times daily.    . DULoxetine (CYMBALTA) 20 MG capsule 20-40 mg daily.    Marland Kitchen levothyroxine (SYNTHROID) 100 MCG tablet Take 1 tablet (100 mcg total) by mouth daily before breakfast. 90 tablet 1  . lisinopril (PRINIVIL,ZESTRIL) 10 MG tablet Take 1 tablet (10 mg total) by mouth daily. 30 tablet 0  . metoprolol succinate (TOPROL-XL) 50 MG 24 hr tablet Take 50 mg by mouth daily. Take with or  immediately following a meal.    . Multiple Vitamin (MULTIVITAMIN) tablet Take 1 tablet by mouth daily.    . Omega-3 Fatty Acids (FISH OIL PO) Take 1 capsule by mouth daily.    Marland Kitchen omeprazole (PRILOSEC) 20 MG capsule Take 20 mg by mouth daily.    . rosuvastatin (CRESTOR) 5 MG tablet Take 5 mg by mouth at bedtime.   1  . tamsulosin (FLOMAX) 0.4 MG CAPS capsule Take 1 capsule (0.4 mg total) by mouth 2 (two) times daily. 60 capsule 0   No current facility-administered medications on file prior to visit.     PAST MEDICAL HISTORY: Past Medical History:  Diagnosis Date  . Anxiety   . Arthritis    lumbar DDD  . Asthma   . Basal cell carcinoma of nose   . Chronic lower back pain   . Coronary artery disease   . Dysrhythmia   . Fibromyalgia   . GERD (gastroesophageal reflux disease)   . High cholesterol   . Hypertension   . Hypothyroidism   . Normal cardiac stress test   . PONV (postoperative nausea and vomiting)   . Shortness of breath    /w walking long distance   . Swallowing difficulty    being seen by ENT- had endoscopy- told that its probably related to past neck surgeries     PAST SURGICAL HISTORY: Past Surgical History:  Procedure Laterality Date  . ANTERIOR CERVICAL DECOMP/DISCECTOMY FUSION  2010  . APPENDECTOMY    . BACK SURGERY    . BASAL CELL CARCINOMA EXCISION     "cut off nose"  . CARDIAC CATHETERIZATION  1970s; 1990s?; 2017  . CATARACT EXTRACTION W/ INTRAOCULAR LENS  IMPLANT, BILATERAL Bilateral   . CORONARY ANGIOPLASTY WITH STENT PLACEMENT  ~ 2017  . DECOMPRESSIVE LUMBAR LAMINECTOMY LEVEL 1 N/A 12/15/2016   Procedure: DECOMPRESSIVE  LAMINECTOMY Thoracic twelve to lumbar one;  Surgeon: Kim Kos, MD;  Location: [facility];  Service: Neurosurgery;  Laterality: N/A;  . DILATION AND CURETTAGE OF UTERUS    . OOPHORECTOMY     "?side"  . POSTERIOR FUSION LUMBAR SPINE  ~ 2013; 2016  . POSTERIOR LUMBAR FUSION 4 LEVEL N/A 12/15/2016   Procedure: Posterior Fusion Thoracic- ten  to Lumbar- two;  Surgeon: Kim Kos, MD;  Location: [facility];  Service: Neurosurgery;  Laterality: N/A;  . TONSILLECTOMY    . TOTAL THYROIDECTOMY  1990's  . TUBAL LIGATION      SOCIAL HISTORY: Social History   Tobacco Use  . Smoking status: Never Smoker  . Smokeless tobacco: Never Used  Substance Use Topics  . Alcohol use: No  . Drug use: No    FAMILY HISTORY: Family History  Problem Relation Age of Onset  . Cancer - Colon Mother   . Thyroid disease Mother   . Obesity Mother   . Heart disease Father   . High Cholesterol Father   . Stroke Father   . Bone cancer  Brother   . Heart disease Brother     ROS: Review of Systems  Constitutional: Positive for weight loss.  Endo/Heme/Allergies:       Negative for polyphagia    PHYSICAL EXAM: Blood pressure (!) 112/51, pulse (!) 51, temperature 98.2 F (36.8 C), temperature source Oral, height 5\' 1"  (1.549 m), weight 185 lb (83.9 kg), SpO2 98 %. Body mass index is 34.96 kg/m. Physical Exam Vitals signs reviewed.  Constitutional:      Appearance: Normal appearance. She is well-developed. She is obese.  Cardiovascular:     Rate and Rhythm: Normal rate.  Pulmonary:     Effort: Pulmonary effort is normal.  Musculoskeletal: Normal range of motion.  Skin:    General: Skin is warm and dry.  Neurological:     Mental Status: She is alert and oriented to person, place, and time.     Comments: + Uses cane  Psychiatric:        Mood and Affect: Mood normal.        Behavior: Behavior normal.     RECENT LABS AND TESTS: BMET    Component Value Date/Time   NA 144 12/13/2018   K 4.3 12/13/2018   CL 102 12/27/2016 0613   CO2 27 12/27/2016 0613   GLUCOSE 122 (H) 12/27/2016 0613   BUN 17 12/13/2018   CREATININE 0.7 12/13/2018   CREATININE 0.64 12/27/2016 0613   CALCIUM 8.3 (L) 12/27/2016 0613   GFRNONAA >60 12/27/2016 0613   GFRAA >60 12/27/2016 0613   Lab Results  Component Value Date   HGBA1C 5.8 (H) 12/22/2016   Lab  Results  Component Value Date   INSULIN 12.6 02/01/2019   CBC    Component Value Date/Time   WBC 11.2 12/13/2018   WBC 7.7 01/03/2017 1205   RBC 4.05 01/03/2017 1205   HGB 13.3 12/13/2018   HCT 37.3 01/03/2017 1205   PLT 259 12/13/2018   MCV 92.1 01/03/2017 1205   MCH 29.4 01/03/2017 1205   MCHC 31.9 01/03/2017 1205   RDW 16.4 (H) 01/03/2017 1205   LYMPHSABS 1.8 01/03/2017 1205   MONOABS 0.6 01/03/2017 1205   EOSABS 0.1 01/03/2017 1205   BASOSABS 0.0 01/03/2017 1205   Iron/TIBC/Ferritin/ %Sat No results found for: IRON, TIBC, FERRITIN, IRONPCTSAT Lipid Panel     Component Value Date/Time   CHOL 150 12/13/2018   TRIG 68 12/13/2018   HDL 66 12/13/2018   LDLCALC 70 12/13/2018   Hepatic Function Panel     Component Value Date/Time   PROT 4.9 (L) 12/22/2016 0546   ALBUMIN 2.5 (L) 12/22/2016 0546   AST 19 12/13/2018   ALT 23 12/13/2018   ALKPHOS 60 12/13/2018   BILITOT 1.0 12/22/2016 0546      Component Value Date/Time   TSH 1.07 01/18/2019 1510   TSH 0.11 (A) 12/13/2018   TSH 0.12 (L) 07/24/2018 1247     Ref. Range 02/01/2019 11:00  Vitamin D, 25-Hydroxy Latest Ref Range: 30.0 - 100.0 ng/mL 44.1    OBESITY BEHAVIORAL INTERVENTION VISIT  Today's visit was # 5   Starting weight: 193 lbs Starting date: 02/01/2019 Today's weight : 185 lbs  Today's date: 04/19/2019 Total lbs lost to date: 8    04/19/2019  Height 5\' 1"  (1.549 m)  Weight 185 lb (83.9 kg)  BMI (Calculated) 34.97  BLOOD PRESSURE - SYSTOLIC [id number]  BLOOD PRESSURE - DIASTOLIC 51   Body Fat % 123456 %  Total Body Water (lbs) 66.2 lbs  ASK: We discussed the diagnosis of obesity with Bary Richard today and Chabeli agreed to give [place] permission to discuss obesity behavioral modification therapy today.  ASSESS: Derricka has the diagnosis of obesity and her BMI today is 34.97 Jahyra is in the action stage of change   ADVISE: Kyi was educated on the multiple health risks of obesity as well as the  benefit of weight loss to improve her health. She was advised of the need for long term treatment and the importance of lifestyle modifications to improve her current health and to decrease her risk of future health problems.  AGREE: Multiple dietary modification options and treatment options were discussed and  Yohana agreed to follow the recommendations documented in the above note.  ARRANGE: Chandlar was educated on the importance of frequent visits to treat obesity as outlined per CMS and USPSTF guidelines and agreed to schedule her next follow up appointment today.  I, Doreene Nest, am acting as transcriptionist for [employer], FNP-C  I have reviewed the above documentation for accuracy and completeness, and I agree with the above.  - Roosevelt Bisher, FNP-C.

## 2019-04-24 ENCOUNTER — Encounter (INDEPENDENT_AMBULATORY_CARE_PROVIDER_SITE_OTHER): Payer: Self-pay | Admitting: Family Medicine

## 2019-04-24 DIAGNOSIS — Z6835 Body mass index (BMI) 35.0-35.9, adult: Secondary | ICD-10-CM | POA: Insufficient documentation

## 2019-05-01 ENCOUNTER — Telehealth (INDEPENDENT_AMBULATORY_CARE_PROVIDER_SITE_OTHER): Payer: Self-pay

## 2019-05-01 NOTE — Telephone Encounter (Signed)
Patient request a lab order be sent to [facility] [address], [place]. Phone # [phone]. Patient stated she was in office 2 weeks ago and should have been done then. Any questions call patient at [phone]

## 2019-05-02 NOTE — Telephone Encounter (Signed)
I will follow up with patient to see what labs she is referring to.

## 2019-05-02 NOTE — Telephone Encounter (Signed)
Call placed to patient and she is aware that labs will be ordered at next office visit per [facility] FNP-C

## 2019-05-10 ENCOUNTER — Ambulatory Visit (INDEPENDENT_AMBULATORY_CARE_PROVIDER_SITE_OTHER): Payer: Medicare Other | Admitting: Family Medicine

## 2019-05-10 ENCOUNTER — Other Ambulatory Visit: Payer: Self-pay

## 2019-05-10 ENCOUNTER — Encounter (INDEPENDENT_AMBULATORY_CARE_PROVIDER_SITE_OTHER): Payer: Self-pay | Admitting: Family Medicine

## 2019-05-10 VITALS — BP 110/67 | HR 51 | Temp 98.7°F | Ht 61.0 in | Wt 185.0 lb

## 2019-05-10 DIAGNOSIS — E038 Other specified hypothyroidism: Secondary | ICD-10-CM | POA: Diagnosis not present

## 2019-05-10 DIAGNOSIS — R7303 Prediabetes: Secondary | ICD-10-CM

## 2019-05-10 DIAGNOSIS — E7849 Other hyperlipidemia: Secondary | ICD-10-CM

## 2019-05-10 DIAGNOSIS — E559 Vitamin D deficiency, unspecified: Secondary | ICD-10-CM | POA: Diagnosis not present

## 2019-05-10 DIAGNOSIS — Z6835 Body mass index (BMI) 35.0-35.9, adult: Secondary | ICD-10-CM | POA: Diagnosis not present

## 2019-05-14 NOTE — Progress Notes (Signed)
Office: [phone]  /  Fax: [phone]   HPI:  Chief Complaint: OBESITY Kim Mcgee is here to discuss her progress with her obesity treatment plan. She is on the Category 1 plan and states she is following her eating plan approximately 95 % of the time. She states she is walking, doing arm exercises and leg lifts for 10 to 15 minutes 5 times per week.  Kim Mcgee feels it has been harder to stay on the plan over the last week. She thinks it is because of holiday advertising.   Pre-Diabetes Kim Mcgee has a diagnosis of prediabetes and she is not on metformin. She reports hunger at times. Lab Results  Component Value Date   HGBA1C 5.8 (H) 12/22/2016    Vitamin D deficiency Kim Mcgee has a diagnosis of vitamin D deficiency. Her vitamin D level is not at goal. Kim Mcgee is on OTC vitamin D.   Hyperlipidemia Kim Mcgee has hyperlipidemia and she is on Crestor 5 mg. She denies chest pain or shortness of breath.   Hypothyroidism Kim Mcgee has a diagnosis of hypothyroidism. She is stable on 100 mcg of levothyroxine.   Today's visit was # 6  Starting weight: 193 lbs Starting date: 02/01/2019 Today's weight : 185 lbs  Today's date: 02/01/2019 Total lbs lost to date: 8 Total lbs lost since last in-office visit: 0  ASSESSMENT AND PLAN:  Prediabetes - Plan: HgB A1c, Insulin, random, Comprehensive Metabolic Panel (CMET)  Vitamin D deficiency - Plan: Vitamin D (25 hydroxy)  Other hyperlipidemia - Plan: Lipid Panel With LDL/HDL Ratio  Other specified hypothyroidism - Plan: T3, T4, free, TSH  Class 2 severe obesity with serious comorbidity and body mass index (BMI) of 35.0 to 35.9 in adult, unspecified obesity type (Kim Mcgee)  PLAN:  Pre-Diabetes Kim Mcgee will continue to work on weight loss, exercise, and decreasing simple carbohydrates to help decrease the risk of diabetes. We will check A1c, fasting glucose and insulin and she will follow up as directed.  Vitamin D Deficiency Low vitamin D level contributes to  fatigue and are associated with obesity, breast, and colon cancer. We will check vitamin D level and she will continue OTC vitamin D 3. Kim Mcgee will follow up for routine testing of vitamin D, at least 2-3 times per year to avoid over-replacement.  Hyperlipidemia Intensive lifestyle modifications as the first line treatment for hyperlipidemia. We discussed many lifestyle modifications today and Kim Mcgee will continue to work on diet, exercise and weight loss efforts. We will check fasting lipid panel.  Hypothyroidism Kim Mcgee was informed of the importance of good thyroid control for overall health and that supertherapeutic thyroid levels are dangerous and will not improve weight loss results. We will check thyroid panel and continue to monitor.  Obesity Kim Mcgee is currently in the action stage of change. As such, her goal is to continue with weight loss efforts She has agreed to follow the Category 1 plan Kim Mcgee will use the exercise machine at home for 2 to 3 minutes, 3 times per week for weight loss and overall health benefits. We discussed the following Behavioral Modification Strategies today: planning for success, keep a strict food journal, increasing lean protein intake and work on meal planning and easy cooking plans  Kim Mcgee may have a frozen meal at dinner (Category 3 or 4), with 1 cup of [employer] milk.  Kim Mcgee has agreed to follow up with our clinic in 3 weeks. She was informed of the importance of frequent follow up visits to maximize her success with intensive lifestyle modifications  for her multiple health conditions.  ALLERGIES: Allergies  Allergen Reactions  . Lyrica [Pregabalin]     Confused/agitated  . Macrodantin [Nitrofurantoin] Other (See Comments)    Chest pain  . Morphine And Related Nausea Only    CAUSED THE DRY HEAVES; CANNOT TAKE AGAIN!!  . Penicillins Rash and Other (See Comments)    Chest pain, also Has patient had a PCN reaction causing immediate rash,  facial/tongue/throat swelling, SOB or lightheadedness with hypotension: Yes Has patient had a PCN reaction causing severe rash involving mucus membranes or skin necrosis: No Has patient had a PCN reaction that required hospitalization: No Has patient had a PCN reaction occurring within the last 10 years: Yes If all of the above answers are "NO", then may proceed with Cephalosporin use.   . Tape Rash    No adhesive tape, but COBAN WRAP IS TOLERATED    MEDICATIONS: Current Outpatient Medications on File Prior to Visit  Medication Sig Dispense Refill  . albuterol (PROVENTIL HFA;VENTOLIN HFA) 108 (90 BASE) MCG/ACT inhaler Inhale 2 puffs into the lungs every 6 (six) hours as needed for shortness of breath.     Marland Kitchen aspirin EC 81 MG tablet Take 81 mg by mouth daily.    . Cholecalciferol (VITAMIN D3) LIQD by Does not apply route.    Marland Kitchen CRANBERRY-VITAMIN C PO Take by mouth 2 (two) times daily.    . DULoxetine (CYMBALTA) 20 MG capsule 20-40 mg daily.    Marland Kitchen levothyroxine (SYNTHROID) 100 MCG tablet Take 1 tablet (100 mcg total) by mouth daily before breakfast. 90 tablet 1  . lisinopril (PRINIVIL,ZESTRIL) 10 MG tablet Take 1 tablet (10 mg total) by mouth daily. 30 tablet 0  . metoprolol succinate (TOPROL-XL) 50 MG 24 hr tablet Take 50 mg by mouth daily. Take with or immediately following a meal.    . Multiple Vitamin (MULTIVITAMIN) tablet Take 1 tablet by mouth daily.    . Omega-3 Fatty Acids (FISH OIL PO) Take 1 capsule by mouth daily.    Marland Kitchen omeprazole (PRILOSEC) 20 MG capsule Take 20 mg by mouth daily.    . rosuvastatin (CRESTOR) 5 MG tablet Take 5 mg by mouth at bedtime.   1  . tamsulosin (FLOMAX) 0.4 MG CAPS capsule Take 1 capsule (0.4 mg total) by mouth 2 (two) times daily. 60 capsule 0   No current facility-administered medications on file prior to visit.    PAST MEDICAL HISTORY: Past Medical History:  Diagnosis Date  . Anxiety   . Arthritis    lumbar DDD  . Asthma   . Basal cell carcinoma  of nose   . Chronic lower back pain   . Coronary artery disease   . Dysrhythmia   . Fibromyalgia   . GERD (gastroesophageal reflux disease)   . High cholesterol   . Hypertension   . Hypothyroidism   . Normal cardiac stress test   . PONV (postoperative nausea and vomiting)   . Shortness of breath    /w walking long distance   . Swallowing difficulty    being seen by ENT- had endoscopy- told that its probably related to past neck surgeries     PAST SURGICAL HISTORY: Past Surgical History:  Procedure Laterality Date  . ANTERIOR CERVICAL DECOMP/DISCECTOMY FUSION  2010  . APPENDECTOMY    . BACK SURGERY    . BASAL CELL CARCINOMA EXCISION     "cut off nose"  . CARDIAC CATHETERIZATION  1970s; 1990s?; 2017  . CATARACT EXTRACTION W/ INTRAOCULAR  LENS  IMPLANT, BILATERAL Bilateral   . CORONARY ANGIOPLASTY WITH STENT PLACEMENT  ~ 2017  . DECOMPRESSIVE LUMBAR LAMINECTOMY LEVEL 1 N/A 12/15/2016   Procedure: DECOMPRESSIVE  LAMINECTOMY Thoracic twelve to lumbar one;  Surgeon: Kary Kos, MD;  Location: [facility];  Service: Neurosurgery;  Laterality: N/A;  . DILATION AND CURETTAGE OF UTERUS    . OOPHORECTOMY     "?side"  . POSTERIOR FUSION LUMBAR SPINE  ~ 2013; 2016  . POSTERIOR LUMBAR FUSION 4 LEVEL N/A 12/15/2016   Procedure: Posterior Fusion Thoracic- ten to Lumbar- two;  Surgeon: Kary Kos, MD;  Location: [facility];  Service: Neurosurgery;  Laterality: N/A;  . TONSILLECTOMY    . TOTAL THYROIDECTOMY  1990's  . TUBAL LIGATION      SOCIAL HISTORY: Social History   Tobacco Use  . Smoking status: Never Smoker  . Smokeless tobacco: Never Used  Substance Use Topics  . Alcohol use: No  . Drug use: No    FAMILY HISTORY: Family History  Problem Relation Age of Onset  . Cancer - Colon Mother   . Thyroid disease Mother   . Obesity Mother   . Heart disease Father   . High Cholesterol Father   . Stroke Father   . Bone cancer Brother   . Heart disease Brother     ROS: Review of Systems   Constitutional: Negative for weight loss.  Respiratory: Negative for shortness of breath.   Cardiovascular: Negative for chest pain.  Endo/Heme/Allergies:       Positive for polyphagia    PHYSICAL EXAM: Blood pressure 110/67, pulse (!) 51, temperature 98.7 F (37.1 C), temperature source Oral, height 5\' 1"  (1.549 m), weight 185 lb (83.9 kg), SpO2 95 %. Body mass index is 34.96 kg/m. Physical Exam Vitals reviewed.  Constitutional:      General: She is not in acute distress.    Appearance: Normal appearance. She is well-developed. She is obese.  Cardiovascular:     Rate and Rhythm: Normal rate.  Pulmonary:     Effort: Pulmonary effort is normal.  Musculoskeletal:        General: Normal range of motion.  Skin:    General: Skin is warm and dry.  Neurological:     Mental Status: She is alert and oriented to person, place, and time.  Psychiatric:        Mood and Affect: Mood normal.        Behavior: Behavior normal.     RECENT LABS AND TESTS: BMET    Component Value Date/Time   NA 144 12/13/2018 0000   K 4.3 12/13/2018 0000   CL 102 12/27/2016 0613   CO2 27 12/27/2016 0613   GLUCOSE 122 (H) 12/27/2016 0613   BUN 17 12/13/2018 0000   CREATININE 0.7 12/13/2018 0000   CREATININE 0.64 12/27/2016 0613   CALCIUM 8.3 (L) 12/27/2016 0613   GFRNONAA >60 12/27/2016 0613   GFRAA >60 12/27/2016 0613   Lab Results  Component Value Date   HGBA1C 5.8 (H) 12/22/2016   Lab Results  Component Value Date   INSULIN 12.6 02/01/2019   CBC    Component Value Date/Time   WBC 11.2 12/13/2018 0000   WBC 7.7 01/03/2017 1205   RBC 4.05 01/03/2017 1205   HGB 13.3 12/13/2018 0000   HCT 37.3 01/03/2017 1205   PLT 259 12/13/2018 0000   MCV 92.1 01/03/2017 1205   MCH 29.4 01/03/2017 1205   MCHC 31.9 01/03/2017 1205   RDW 16.4 (H)  01/03/2017 1205   LYMPHSABS 1.8 01/03/2017 1205   MONOABS 0.6 01/03/2017 1205   EOSABS 0.1 01/03/2017 1205   BASOSABS 0.0 01/03/2017 1205    Iron/TIBC/Ferritin/ %Sat No results found for: IRON, TIBC, FERRITIN, IRONPCTSAT Lipid Panel     Component Value Date/Time   CHOL 150 12/13/2018 0000   TRIG 68 12/13/2018 0000   HDL 66 12/13/2018 0000   LDLCALC 70 12/13/2018 0000   Hepatic Function Panel     Component Value Date/Time   PROT 4.9 (L) 12/22/2016 0546   ALBUMIN 2.5 (L) 12/22/2016 0546   AST 19 12/13/2018 0000   ALT 23 12/13/2018 0000   ALKPHOS 60 12/13/2018 0000   BILITOT 1.0 12/22/2016 0546      Component Value Date/Time   TSH 1.07 01/18/2019 1510   TSH 0.11 (A) 12/13/2018 0000   TSH 0.12 (L) 07/24/2018 1247     OBESITY BEHAVIORAL INTERVENTION VISIT DOCUMENTATION FOR INSURANCE (~15 minutes)    ASK: We discussed the diagnosis of obesity with Kim Mcgee today and Kim Mcgee agreed to give [place] permission to discuss obesity behavioral modification therapy today.  ASSESS: Kim Mcgee has the diagnosis of obesity and her BMI today is  Kim Mcgee is in the action stage of change   ADVISE: Kim Mcgee was educated on the multiple health risks of obesity as well as the benefit of weight loss to improve her health. She was advised of the need for long term treatment and the importance of lifestyle modifications to improve her current health and to decrease her risk of future health problems.  AGREE: Multiple dietary modification options and treatment options were discussed and  Kim Mcgee agreed to follow the recommendations documented in the above note.  ARRANGE: Daaiyah was educated on the importance of frequent visits to treat obesity as outlined per CMS and USPSTF guidelines and agreed to schedule her next follow up appointment today.   I, Doreene Nest, am acting as transcriptionist for [employer], FNP-C  I have reviewed the above documentation for accuracy and completeness, and I agree with the above.  - Tenicia Gural, FNP-C.

## 2019-05-15 ENCOUNTER — Encounter (INDEPENDENT_AMBULATORY_CARE_PROVIDER_SITE_OTHER): Payer: Self-pay | Admitting: Family Medicine

## 2019-05-15 DIAGNOSIS — E559 Vitamin D deficiency, unspecified: Secondary | ICD-10-CM | POA: Insufficient documentation

## 2019-05-29 ENCOUNTER — Ambulatory Visit: Payer: Medicare Other | Admitting: "Endocrinology

## 2019-06-06 ENCOUNTER — Ambulatory Visit (INDEPENDENT_AMBULATORY_CARE_PROVIDER_SITE_OTHER): Payer: Medicare Other | Admitting: Bariatrics

## 2019-06-06 ENCOUNTER — Other Ambulatory Visit: Payer: Self-pay

## 2019-06-06 ENCOUNTER — Encounter (INDEPENDENT_AMBULATORY_CARE_PROVIDER_SITE_OTHER): Payer: Self-pay | Admitting: Bariatrics

## 2019-06-06 VITALS — BP 85/51 | HR 67 | Temp 98.2°F | Ht 61.0 in | Wt 179.0 lb

## 2019-06-06 DIAGNOSIS — E7849 Other hyperlipidemia: Secondary | ICD-10-CM | POA: Diagnosis not present

## 2019-06-06 DIAGNOSIS — E559 Vitamin D deficiency, unspecified: Secondary | ICD-10-CM | POA: Diagnosis not present

## 2019-06-06 DIAGNOSIS — Z6834 Body mass index (BMI) 34.0-34.9, adult: Secondary | ICD-10-CM

## 2019-06-06 DIAGNOSIS — E669 Obesity, unspecified: Secondary | ICD-10-CM

## 2019-06-06 DIAGNOSIS — I10 Essential (primary) hypertension: Secondary | ICD-10-CM | POA: Diagnosis not present

## 2019-06-06 MED ORDER — VITAMIN D (ERGOCALCIFEROL) 1.25 MG (50000 UNIT) PO CAPS: 50000.0000 [IU] | ORAL_CAPSULE | ORAL | 0 refills | Status: DC

## 2019-06-07 ENCOUNTER — Ambulatory Visit (INDEPENDENT_AMBULATORY_CARE_PROVIDER_SITE_OTHER): Payer: Medicare Other | Admitting: Bariatrics

## 2019-06-07 NOTE — Progress Notes (Signed)
Chief Complaint: OBESITY Kim Mcgee is here to discuss her progress with her obesity treatment plan along with follow-up of her obesity related diagnoses. Kim Mcgee is on the Category 1 Plan and states she is following her eating plan approximately 85% of the time. Kim Mcgee states she is walking 5-8 minutes 3-4 times per week.  Today's visit was #: 7 Starting weight: 193 lbs Starting date: 02/01/2019 Today's weight: 179 lbs Today's date: 06/06/2019 Total lbs lost to date: 14  Total lbs lost since last in-office visit: 6  Interim History: Kim Mcgee is down 6 lbs and doing well. She was mindful of her eating. She has done well with her water.  Subjective:   Essential hypertension. Kim Mcgee called her doctor this a.m. and told them her blood pressure was low. Blood pressure today in our office 85/51.  Other hyperlipidemia. Kim Mcgee is taking Crestor. No myalgias.  Vitamin D deficiency. No nausea, vomiting, or muscle weakness. Last Vitamin D 44.1 on 02/01/2019.   Assessment/Plan:   Essential hypertension. Kim Mcgee will stop lisinopril. She states that her other provider added sotalol. If systolic less than [id number], she will call her provider.  Other hyperlipidemia. Kim Mcgee will continue Crestor.  Vitamin D deficiency. Vitamin D 50,000 IU 1 every 14 days #4 with 0 refills was prescribed.  Class 1 obesity with serious comorbidity and body mass index (BMI) of 34.0 to 34.9 in adult, unspecified obesity type.  Kim Mcgee is currently in the action stage of change. As such, her goal is to continue with weight loss efforts. She has agreed to Category 1 Plan.   She will work on meal planning, intentional eating, will not bring "bad things" in the home, and will increase her water intake.  We discussed the following exercise goals today: Older adults should follow the adult guidelines. When older adults cannot meet the adult guidelines, they should be as physically active as their abilities and conditions will  allow.  Older adults should do exercises that maintain or improve balance if they are at risk of falling.   We discussed the following behavioral modification strategies today: increasing lean protein intake, decreasing simple carbohydrates, increasing vegetables, increasing water intake, decreasing eating out, no skipping meals, meal planning and cooking strategies, keeping healthy foods in the home and planning for success.  Kim Mcgee has agreed to follow-up with our clinic in 2-3 weeks. She was informed of the importance of frequent follow-up visits to maximize her success with intensive lifestyle modifications for her multiple health conditions.  Objective:   Blood pressure (!) 85/51, pulse 67, temperature 98.2 F (36.8 C), height 5\' 1"  (1.549 m), weight 179 lb (81.2 kg), SpO2 98 %. Body mass index is 33.82 kg/m.  General: Cooperative, alert, well developed, in no acute distress. HEENT: Conjunctivae and lids unremarkable. Neck: No thyromegaly.  Cardiovascular: Regular rhythm.  Lungs: Normal work of breathing. Extremities: No edema.  Neurologic: No focal deficits. She uses a 4-point cane.  Lab Results  Component Value Date   CREATININE 0.7 12/13/2018   BUN 17 12/13/2018   NA 144 12/13/2018   K 4.3 12/13/2018   CL 102 12/27/2016   CO2 27 12/27/2016   Lab Results  Component Value Date   ALT 23 12/13/2018   AST 19 12/13/2018   ALKPHOS 60 12/13/2018   BILITOT 1.0 12/22/2016   Lab Results  Component Value Date   HGBA1C 5.8 (H) 12/22/2016   Lab Results  Component Value Date   INSULIN 12.6 02/01/2019   Lab  Results  Component Value Date   TSH 1.07 01/18/2019   Lab Results  Component Value Date   CHOL 150 12/13/2018   HDL 66 12/13/2018   LDLCALC 70 12/13/2018   TRIG 68 12/13/2018   Lab Results  Component Value Date   WBC 11.2 12/13/2018   HGB 13.3 12/13/2018   HCT 37.3 01/03/2017   MCV 92.1 01/03/2017   PLT 259 12/13/2018   No results found for: IRON,  TIBC, FERRITIN Obesity Behavioral Intervention Visit Documentation for Insurance (15 Minutes):   ASK: We discussed the diagnosis of obesity with Kim Mcgee today and Kim Mcgee agreed to give [place] permission to discuss obesity behavioral modification therapy today.  ASSESS: Kim Mcgee has the diagnosis of obesity and her BMI today is 34.0. Kim Mcgee is in the action stage of change.   ADVISE: Kim Mcgee was educated on the multiple health risks of obesity as well as the benefit of weight loss to improve her health. She was advised of the need for long term treatment and the importance of lifestyle modifications to improve her current health and to decrease her risk of future health problems.  AGREE: Multiple dietary modification options and treatment options were discussed and Kim Mcgee agreed to follow the recommendations documented in the above note.  ARRANGE: Kim Mcgee was educated on the importance of frequent visits to treat obesity as outlined per CMS and USPSTF guidelines and agreed to schedule her next follow up appointment today.  Attestation Statements:   Reviewed by clinician on day of visit: allergies, medications, problem list, medical history, surgical history, family history, social history and previous encounter notes.  This visit occurred during the SARS-CoV-2 public health emergency. Safety protocols were in place, including screening questions prior to the visit, additional usage of staff PPE, and extensive cleaning of exam room while observing appropriate contact time as indicated for disinfecting solutions. (CPT [id number])  I, Michaelene Song, am acting as transcriptionist for [employer], DO  I have reviewed the above documentation for accuracy and completeness, and I agree with the above. Jearld Lesch, DO

## 2019-06-09 ENCOUNTER — Encounter (INDEPENDENT_AMBULATORY_CARE_PROVIDER_SITE_OTHER): Payer: Self-pay | Admitting: Bariatrics

## 2019-06-28 ENCOUNTER — Ambulatory Visit (INDEPENDENT_AMBULATORY_CARE_PROVIDER_SITE_OTHER): Payer: Medicare Other | Admitting: Bariatrics

## 2019-07-12 ENCOUNTER — Other Ambulatory Visit: Payer: Self-pay

## 2019-07-12 ENCOUNTER — Telehealth (INDEPENDENT_AMBULATORY_CARE_PROVIDER_SITE_OTHER): Payer: Medicare Other | Admitting: Bariatrics

## 2019-07-12 ENCOUNTER — Encounter (INDEPENDENT_AMBULATORY_CARE_PROVIDER_SITE_OTHER): Payer: Self-pay | Admitting: Bariatrics

## 2019-07-12 DIAGNOSIS — E669 Obesity, unspecified: Secondary | ICD-10-CM

## 2019-07-12 DIAGNOSIS — Z6833 Body mass index (BMI) 33.0-33.9, adult: Secondary | ICD-10-CM | POA: Diagnosis not present

## 2019-07-12 DIAGNOSIS — R7303 Prediabetes: Secondary | ICD-10-CM | POA: Diagnosis not present

## 2019-07-12 DIAGNOSIS — E559 Vitamin D deficiency, unspecified: Secondary | ICD-10-CM | POA: Diagnosis not present

## 2019-07-12 MED ORDER — VITAMIN D (ERGOCALCIFEROL) 1.25 MG (50000 UNIT) PO CAPS: 50000.0000 [IU] | ORAL_CAPSULE | ORAL | 0 refills | Status: DC

## 2019-07-12 NOTE — Progress Notes (Signed)
TeleHealth Visit:  Due to the COVID-19 pandemic, this visit was completed with telemedicine (audio/video) technology to reduce patient and provider exposure as well as to preserve personal protective equipment.   Kim Mcgee has verbally consented to this TeleHealth visit. The patient is located at home, the provider is located at the [facility] office. The participants in this visit include the listed provider and patient. The visit was conducted today via telephone call.  Kim Mcgee was unable to use realtime audiovisual technology today and the telehealth visit was conducted via telephone.  Chief Complaint: OBESITY Kim Mcgee is here to discuss her progress with her obesity treatment plan along with follow-up of her obesity related diagnoses. Kim Mcgee is on the Category 1 Plan and states she is following her eating plan approximately 90% of the time. Kim Mcgee states she is walking 10-15 minutes 7 times per week.  Today's visit was #: 8 Starting weight: 193 lbs Starting date: 02/01/2019  Interim History: Kim Mcgee states that her weight has stayed the same (185 lbs). She has an ablation procedure upcoming (irregular heart rate) with low pulses.  Subjective:   Prediabetes. Kim Mcgee has a diagnosis of prediabetes based on her elevated HgA1c and was informed this puts her at greater risk of developing diabetes. She continues to work on diet and exercise to decrease her risk of diabetes. She denies nausea or hypoglycemia. No polyphagia.  Lab Results  Component Value Date   HGBA1C 5.8 (H) 12/22/2016   Lab Results  Component Value Date   INSULIN 12.6 02/01/2019   Vitamin D deficiency. No nausea, vomiting, or muscle weakness. Last Vitamin D level 44.1 on 02/01/2019.  Assessment/Plan:   Prediabetes. Kim Mcgee will continue to work on weight loss, exercise, increasing activity as tolerated, and decreasing simple carbohydrates to help decrease the risk of diabetes.   Vitamin D deficiency. Low  Vitamin D level contributes to fatigue and are associated with obesity, breast, and colon cancer. She was given a prescription for Vitamin D, Ergocalciferol, (DRISDOL) 1.25 MG (50000 UNIT) CAPS capsule every week #4 with 0 refills and will follow-up for routine testing of Vitamin D, at least 2-3 times per year to avoid over-replacement.    Class 1 obesity with serious comorbidity and body mass index (BMI) of 33.0 to 33.9 in adult, unspecified obesity type.  Kim Mcgee is currently in the action stage of change. As such, her goal is to continue with weight loss efforts. She has agreed to the Category 1 Plan.   She will work on meal planning, mindful eating, increasing her water and protein intake.  Exercise goals: Kim Mcgee will continue walking.   Behavioral modification strategies: increasing lean protein intake, decreasing simple carbohydrates, increasing vegetables, increasing water intake, decreasing eating out, no skipping meals, meal planning and cooking strategies, keeping healthy foods in the home and planning for success.  Kim Mcgee has agreed to follow-up with our clinic in 2 weeks. She was informed of the importance of frequent follow-up visits to maximize her success with intensive lifestyle modifications for her multiple health conditions.  Objective:   VITALS: Per patient if applicable, see vitals. GENERAL: Alert and in no acute distress. CARDIOPULMONARY: No increased WOB. Speaking in clear sentences.  PSYCH: Pleasant and cooperative. Speech normal rate and rhythm. Affect is appropriate. Insight and judgement are appropriate. Attention is focused, linear, and appropriate.  NEURO: Oriented as arrived to appointment on time with no prompting.   Lab Results  Component Value Date   CREATININE 0.7 12/13/2018  BUN 17 12/13/2018   NA 144 12/13/2018   K 4.3 12/13/2018   CL 102 12/27/2016   CO2 27 12/27/2016   Lab Results  Component Value Date   ALT 23 12/13/2018   AST 19 12/13/2018    ALKPHOS 60 12/13/2018   BILITOT 1.0 12/22/2016   Lab Results  Component Value Date   HGBA1C 5.8 (H) 12/22/2016   Lab Results  Component Value Date   INSULIN 12.6 02/01/2019   Lab Results  Component Value Date   TSH 1.07 01/18/2019   Lab Results  Component Value Date   CHOL 150 12/13/2018   HDL 66 12/13/2018   LDLCALC 70 12/13/2018   TRIG 68 12/13/2018   Lab Results  Component Value Date   WBC 11.2 12/13/2018   HGB 13.3 12/13/2018   HCT 37.3 01/03/2017   MCV 92.1 01/03/2017   PLT 259 12/13/2018   No results found for: IRON, TIBC, FERRITIN  Attestation Statements:   Reviewed by clinician on day of visit: allergies, medications, problem list, medical history, surgical history, family history, social history, and previous encounter notes.  Time spent on visit including pre-visit chart review and post-visit care was 20 minutes.   Migdalia Dk, am acting as Location manager for [employer], DO   I have reviewed the above documentation for accuracy and completeness, and I agree with the above. Jearld Lesch, DO

## 2019-07-26 ENCOUNTER — Telehealth (INDEPENDENT_AMBULATORY_CARE_PROVIDER_SITE_OTHER): Payer: Medicare Other | Admitting: Bariatrics

## 2019-08-02 ENCOUNTER — Encounter (INDEPENDENT_AMBULATORY_CARE_PROVIDER_SITE_OTHER): Payer: Self-pay | Admitting: Bariatrics

## 2019-08-02 ENCOUNTER — Telehealth (INDEPENDENT_AMBULATORY_CARE_PROVIDER_SITE_OTHER): Payer: Medicare Other | Admitting: Bariatrics

## 2019-08-02 ENCOUNTER — Other Ambulatory Visit: Payer: Self-pay

## 2019-08-02 DIAGNOSIS — E669 Obesity, unspecified: Secondary | ICD-10-CM | POA: Diagnosis not present

## 2019-08-02 DIAGNOSIS — E559 Vitamin D deficiency, unspecified: Secondary | ICD-10-CM

## 2019-08-02 DIAGNOSIS — E7849 Other hyperlipidemia: Secondary | ICD-10-CM

## 2019-08-02 DIAGNOSIS — Z6833 Body mass index (BMI) 33.0-33.9, adult: Secondary | ICD-10-CM | POA: Diagnosis not present

## 2019-08-02 MED ORDER — VITAMIN D (ERGOCALCIFEROL) 1.25 MG (50000 UNIT) PO CAPS: 50000.0000 [IU] | ORAL_CAPSULE | ORAL | 0 refills | Status: DC

## 2019-08-02 NOTE — Progress Notes (Signed)
TeleHealth Visit:  Due to the COVID-19 pandemic, this visit was completed with telemedicine (audio/video) technology to reduce patient and provider exposure as well as to preserve personal protective equipment.   Kim Mcgee has verbally consented to this TeleHealth visit. The patient is located at home, the provider is located at the [facility] office. The participants in this visit include the listed provider and patient. The visit was conducted today via audio.  Kim Mcgee was unable to use realtime audiovisual technology today and the telehealth visit was conducted via telephone.  Chief Complaint: OBESITY Kim Mcgee is here to discuss her progress with her obesity treatment plan along with follow-up of her obesity related diagnoses. Kim Mcgee is on the Category 1 Plan and states she is following her eating plan approximately 85-90% of the time. Kim Mcgee states she is walking 15-20 minutes 7 times per week.  Today's visit was #: 9 Starting weight: 193 lbs Starting date: 02/01/2019  Interim History: Kim Mcgee states that her weight remains the same (weight 186). She has an upcoming heart catheterization procedure.  Subjective:   Vitamin D deficiency. No nausea, vomiting, or muscle weakness. Last Vitamin D 44.1 on 02/01/2019.  Other hyperlipidemia. Kim Mcgee is taking Crestor.   Lab Results  Component Value Date   CHOL 150 12/13/2018   HDL 66 12/13/2018   LDLCALC 70 12/13/2018   TRIG 68 12/13/2018   Lab Results  Component Value Date   ALT 23 12/13/2018   AST 19 12/13/2018   ALKPHOS 60 12/13/2018   BILITOT 1.0 12/22/2016   The ASCVD Risk score Kim Bussing DC Jr., Kim al., Kim Mcgee) failed to calculate for the following reasons:   The valid systolic blood pressure range is 90 to 200 mmHg  Assessment/Plan:   Vitamin D deficiency. Low Vitamin D level contributes to fatigue and are associated with obesity, breast, and colon cancer. She was given a prescription for Vitamin D, Ergocalciferol,  (DRISDOL) 1.25 MG (50000 UNIT) CAPS capsule every week #4 with 0 refills and will follow-up for routine testing of Vitamin D, at least 2-3 times per year to avoid over-replacement.     Other hyperlipidemia. Cardiovascular risk and specific lipid/LDL goals reviewed.  We discussed several lifestyle modifications today and Kim Mcgee will continue to work on diet, exercise and weight loss efforts. Orders and follow up as documented in patient record. Kim Mcgee will continue Crestor. She will decrease trans and saturated fats.  Counseling Intensive lifestyle modifications are the first line treatment for this issue. . Dietary changes: Increase soluble fiber. Decrease simple carbohydrates. . Exercise changes: Moderate to vigorous-intensity aerobic activity 150 minutes per week if tolerated. . Lipid-lowering medications: see documented in medical record.  Class 1 obesity with serious comorbidity and body mass index (BMI) of 33.0 to 33.9 in adult, unspecified obesity type.  Kim Mcgee is currently in the action stage of change. As such, her goal is to continue with weight loss efforts. She has agreed to the Category 1 Plan.   She will work on meal planning, mindful eating, and increasing her protein.  Exercise goals: Kim Mcgee will continue to walk 15-20 minutes 7 days per week.  Behavioral modification strategies: increasing lean protein intake, decreasing simple carbohydrates, increasing vegetables, increasing water intake, decreasing eating out, no skipping meals, meal planning and cooking strategies, keeping healthy foods in the home and planning for success.  Kim Mcgee has agreed to follow-up with our clinic in 2 weeks. She was informed of the importance of frequent follow-up visits to maximize her success  with intensive lifestyle modifications for her multiple health conditions.  Objective:   VITALS: Per patient if applicable, see vitals. GENERAL: Alert and in no acute distress. CARDIOPULMONARY: No increased WOB.  Speaking in clear sentences.  PSYCH: Pleasant and cooperative. Speech normal rate and rhythm. Affect is appropriate. Insight and judgement are appropriate. Attention is focused, linear, and appropriate.  NEURO: Oriented as arrived to appointment on time with no prompting.   Lab Results  Component Value Date   CREATININE 0.7 12/13/2018   BUN 17 12/13/2018   NA 144 12/13/2018   K 4.3 12/13/2018   CL 102 12/27/2016   CO2 27 12/27/2016   Lab Results  Component Value Date   ALT 23 12/13/2018   AST 19 12/13/2018   ALKPHOS 60 12/13/2018   BILITOT 1.0 12/22/2016   Lab Results  Component Value Date   HGBA1C 5.8 (H) 12/22/2016   Lab Results  Component Value Date   INSULIN 12.6 02/01/2019   Lab Results  Component Value Date   TSH 1.07 01/18/2019   Lab Results  Component Value Date   CHOL 150 12/13/2018   HDL 66 12/13/2018   LDLCALC 70 12/13/2018   TRIG 68 12/13/2018   Lab Results  Component Value Date   WBC 11.2 12/13/2018   HGB 13.3 12/13/2018   HCT 37.3 01/03/2017   MCV 92.1 01/03/2017   PLT 259 12/13/2018   No results found for: IRON, TIBC, FERRITIN  Attestation Statements:   Reviewed by clinician on day of visit: allergies, medications, problem list, medical history, surgical history, family history, social history, and previous encounter notes.  Time spent on visit including pre-visit chart review and post-visit charting and care was 20 minutes.   Migdalia Dk, am acting as Location manager for [employer], DO   I have reviewed the above documentation for accuracy and completeness, and I agree with the above. Jearld Lesch, DO

## 2019-08-09 ENCOUNTER — Other Ambulatory Visit (INDEPENDENT_AMBULATORY_CARE_PROVIDER_SITE_OTHER): Payer: Self-pay | Admitting: Bariatrics

## 2019-08-09 DIAGNOSIS — E559 Vitamin D deficiency, unspecified: Secondary | ICD-10-CM

## 2019-08-21 ENCOUNTER — Ambulatory Visit (INDEPENDENT_AMBULATORY_CARE_PROVIDER_SITE_OTHER): Payer: Medicare Other | Admitting: Bariatrics

## 2019-09-06 ENCOUNTER — Ambulatory Visit (INDEPENDENT_AMBULATORY_CARE_PROVIDER_SITE_OTHER): Payer: Medicare Other | Admitting: Bariatrics

## 2019-09-13 ENCOUNTER — Ambulatory Visit (INDEPENDENT_AMBULATORY_CARE_PROVIDER_SITE_OTHER): Payer: Medicare Other | Admitting: Bariatrics

## 2019-09-13 ENCOUNTER — Encounter (INDEPENDENT_AMBULATORY_CARE_PROVIDER_SITE_OTHER): Payer: Self-pay | Admitting: Bariatrics

## 2019-09-13 ENCOUNTER — Other Ambulatory Visit: Payer: Self-pay

## 2019-09-13 VITALS — BP 129/75 | HR 62 | Temp 97.9°F | Ht 61.0 in | Wt 179.0 lb

## 2019-09-13 DIAGNOSIS — Z6834 Body mass index (BMI) 34.0-34.9, adult: Secondary | ICD-10-CM | POA: Diagnosis not present

## 2019-09-13 DIAGNOSIS — E7849 Other hyperlipidemia: Secondary | ICD-10-CM

## 2019-09-13 DIAGNOSIS — E559 Vitamin D deficiency, unspecified: Secondary | ICD-10-CM | POA: Diagnosis not present

## 2019-09-13 DIAGNOSIS — Z6833 Body mass index (BMI) 33.0-33.9, adult: Secondary | ICD-10-CM | POA: Diagnosis not present

## 2019-09-13 DIAGNOSIS — E66811 Obesity, class 1: Secondary | ICD-10-CM

## 2019-09-13 DIAGNOSIS — E669 Obesity, unspecified: Secondary | ICD-10-CM

## 2019-09-13 MED ORDER — VITAMIN D (ERGOCALCIFEROL) 1.25 MG (50000 UNIT) PO CAPS: 50000.0000 [IU] | ORAL_CAPSULE | ORAL | 0 refills | Status: AC

## 2019-09-17 NOTE — Progress Notes (Signed)
Chief Complaint:   OBESITY Kim Mcgee is here to discuss her progress with her obesity treatment plan along with follow-up of her obesity related diagnoses. Kim Mcgee is on the Category 1 Plan and states she is following her eating plan approximately 85-90% of the time. Kim Mcgee states she is walking 15-20 minutes 5 times per week.  Today's visit was #: 10 Starting weight: 193 lbs Starting date: 02/01/2019 Today's weight: 179 lbs Today's date: 09/13/2019 Total lbs lost to date: 14 Total lbs lost since last in-office visit: 0  Interim History: Kim Mcgee's weight remains the same. She had cardiac ablation surgery and notes being hospitalized for 3 days after surgery due to elevated heart rate. She notes her energy levels are up and down and notes decreased appetite. She would like to work on getting more protein, water, exercise, and vegetables.  Subjective:   Vitamin D deficiency. Kim Mcgee is on prescription Vitamin D weekly. No nausea, vomiting, or muscle weakness. Last Vitamin D 44.1 on 02/01/2019.  Other hyperlipidemia. Kim Mcgee is on Crestor and denies side effects.   Lab Results  Component Value Date   CHOL 150 12/13/2018   HDL 66 12/13/2018   LDLCALC 70 12/13/2018   TRIG 68 12/13/2018   Lab Results  Component Value Date   ALT 23 12/13/2018   AST 19 12/13/2018   ALKPHOS 60 12/13/2018   BILITOT 1.0 12/22/2016   The 10-year ASCVD risk score Mikey Bussing DC Jr., et al., 2013) is: 29.5%*   Values used to calculate the score:     Age: 79 years     Sex: Female     Is Non-Hispanic African American: No     Diabetic: No     Tobacco smoker: No     Systolic Blood Pressure: [id number] mmHg     Is BP treated: Yes     HDL Cholesterol: 66 mg/dL*     Total Cholesterol: 150 mg/dL*     * - Cholesterol units were assumed for this score calculation  Assessment/Plan:   Vitamin D deficiency. Low Vitamin D level contributes to fatigue and are associated with obesity, breast, and colon cancer. She was given  a refill on her Vitamin D, Ergocalciferol, (DRISDOL) 1.25 MG (50000 UNIT) CAPS capsule every week #4 with 0 refills and will follow-up for routine testing of Vitamin D, at least 2-3 times per year to avoid over-replacement.     Other hyperlipidemia. Cardiovascular risk and specific lipid/LDL goals reviewed.  We discussed several lifestyle modifications today and Kim Mcgee will continue to work on diet, exercise and weight loss efforts. Orders and follow up as documented in patient record. Kim Mcgee will continue her medication as directed.  Counseling Intensive lifestyle modifications are the first line treatment for this issue. . Dietary changes: Increase soluble fiber. Decrease simple carbohydrates. . Exercise changes: Moderate to vigorous-intensity aerobic activity 150 minutes per week if tolerated. . Lipid-lowering medications: see documented in medical record.  Class 1 obesity with serious comorbidity and body mass index (BMI) of 34.0 to 34.9 in adult, unspecified obesity type.  Kim Mcgee is currently in the action stage of change. As such, her goal is to continue with weight loss efforts. She has agreed to the Category 1 Plan.   She will work on meal planning.  She was advised it was okay to have sugar free popsicles or Fiber One bars.  Protein and Snack handouts were given.  Exercise goals: Kim Mcgee will continue her current exercise regimen.  Behavioral modification strategies: increasing lean  protein intake, decreasing simple carbohydrates, increasing vegetables, increasing water intake, decreasing eating out, no skipping meals, meal planning and cooking strategies, keeping healthy foods in the home and planning for success.  Kim Mcgee has agreed to follow-up with our clinic in 4 weeks. She was informed of the importance of frequent follow-up visits to maximize her success with intensive lifestyle modifications for her multiple health conditions.   Objective:   Blood pressure 129/75, pulse 62,  temperature 97.9 F (36.6 C), height 5\' 1"  (1.549 m), weight 179 lb (81.2 kg), SpO2 98 %. Body mass index is 33.82 kg/m.  General: Cooperative, alert, well developed, in no acute distress. HEENT: Conjunctivae and lids unremarkable. Cardiovascular: Regular rhythm.  Lungs: Normal work of breathing. Neurologic: No focal deficits. Walks with a cane.  Lab Results  Component Value Date   CREATININE 0.7 12/13/2018   BUN 17 12/13/2018   NA 144 12/13/2018   K 4.3 12/13/2018   CL 102 12/27/2016   CO2 27 12/27/2016   Lab Results  Component Value Date   ALT 23 12/13/2018   AST 19 12/13/2018   ALKPHOS 60 12/13/2018   BILITOT 1.0 12/22/2016   Lab Results  Component Value Date   HGBA1C 5.8 (H) 12/22/2016   Lab Results  Component Value Date   INSULIN 12.6 02/01/2019   Lab Results  Component Value Date   TSH 1.07 01/18/2019   Lab Results  Component Value Date   CHOL 150 12/13/2018   HDL 66 12/13/2018   LDLCALC 70 12/13/2018   TRIG 68 12/13/2018   Lab Results  Component Value Date   WBC 11.2 12/13/2018   HGB 13.3 12/13/2018   HCT 37.3 01/03/2017   MCV 92.1 01/03/2017   PLT 259 12/13/2018   No results found for: IRON, TIBC, FERRITIN  Obesity Behavioral Intervention Documentation for Insurance:   Approximately 15 minutes were spent on the discussion below.  ASK: We discussed the diagnosis of obesity with Kim Mcgee today and Kim Mcgee agreed to give [place] permission to discuss obesity behavioral modification therapy today.  ASSESS: Kim Mcgee has the diagnosis of obesity and her BMI today is 34.0. Kim Mcgee is in the action stage of change.   ADVISE: Kim Mcgee was educated on the multiple health risks of obesity as well as the benefit of weight loss to improve her health. She was advised of the need for long term treatment and the importance of lifestyle modifications to improve her current health and to decrease her risk of future health problems.  AGREE: Multiple dietary modification  options and treatment options were discussed and Kim Mcgee agreed to follow the recommendations documented in the above note.  ARRANGE: Kim Mcgee was educated on the importance of frequent visits to treat obesity as outlined per CMS and USPSTF guidelines and agreed to schedule her next follow up appointment today.  Attestation Statements:   Reviewed by clinician on day of visit: allergies, medications, problem list, medical history, surgical history, family history, social history, and previous encounter notes.  Migdalia Dk, am acting as Location manager for [employer], DO   I have reviewed the above documentation for accuracy and completeness, and I agree with the above. Jearld Lesch, DO

## 2019-09-18 ENCOUNTER — Encounter (INDEPENDENT_AMBULATORY_CARE_PROVIDER_SITE_OTHER): Payer: Self-pay | Admitting: Bariatrics

## 2019-10-18 ENCOUNTER — Ambulatory Visit (INDEPENDENT_AMBULATORY_CARE_PROVIDER_SITE_OTHER): Payer: Medicare Other | Admitting: Bariatrics

## 2019-10-21 ENCOUNTER — Other Ambulatory Visit: Payer: Self-pay | Admitting: "Endocrinology
